# Patient Record
Sex: Female | Born: 1935 | Race: White | Hispanic: No | Smoking: Never smoker
Health system: Southern US, Community
[De-identification: ages and names within clinical notes are randomized; demographics above are authoritative.]

## PROBLEM LIST (undated history)

## (undated) DIAGNOSIS — F0390 Unspecified dementia without behavioral disturbance: Secondary | ICD-10-CM

## (undated) DIAGNOSIS — I340 Nonrheumatic mitral (valve) insufficiency: Secondary | ICD-10-CM

## (undated) DIAGNOSIS — D72829 Elevated white blood cell count, unspecified: Secondary | ICD-10-CM

## (undated) DIAGNOSIS — E785 Hyperlipidemia, unspecified: Secondary | ICD-10-CM

## (undated) DIAGNOSIS — F419 Anxiety disorder, unspecified: Secondary | ICD-10-CM

## (undated) DIAGNOSIS — I2510 Atherosclerotic heart disease of native coronary artery without angina pectoris: Secondary | ICD-10-CM

## (undated) DIAGNOSIS — R269 Unspecified abnormalities of gait and mobility: Secondary | ICD-10-CM

## (undated) DIAGNOSIS — I10 Essential (primary) hypertension: Secondary | ICD-10-CM

## (undated) DIAGNOSIS — G629 Polyneuropathy, unspecified: Secondary | ICD-10-CM

## (undated) DIAGNOSIS — S62101A Fracture of unspecified carpal bone, right wrist, initial encounter for closed fracture: Secondary | ICD-10-CM

## (undated) DIAGNOSIS — R200 Anesthesia of skin: Secondary | ICD-10-CM

## (undated) DIAGNOSIS — I341 Nonrheumatic mitral (valve) prolapse: Secondary | ICD-10-CM

## (undated) DIAGNOSIS — I219 Acute myocardial infarction, unspecified: Secondary | ICD-10-CM

## (undated) DIAGNOSIS — I4891 Unspecified atrial fibrillation: Secondary | ICD-10-CM

## (undated) DIAGNOSIS — I6529 Occlusion and stenosis of unspecified carotid artery: Secondary | ICD-10-CM

## (undated) DIAGNOSIS — C9110 Chronic lymphocytic leukemia of B-cell type not having achieved remission: Secondary | ICD-10-CM

## (undated) HISTORY — DX: Unspecified abnormalities of gait and mobility: R26.9

## (undated) HISTORY — PX: CORONARY ANGIOPLASTY WITH STENT PLACEMENT: SHX49

## (undated) HISTORY — PX: CARDIAC CATHETERIZATION: SHX172

## (undated) HISTORY — PX: CORONARY ARTERY BYPASS GRAFT: SHX141

## (undated) HISTORY — DX: Chronic lymphocytic leukemia of B-cell type not having achieved remission: C91.10

---

## 1983-06-19 HISTORY — PX: INNER EAR SURGERY: SHX679

## 1989-06-18 HISTORY — PX: WRIST RECONSTRUCTION: SHX2675

## 1999-01-25 ENCOUNTER — Inpatient Hospital Stay (HOSPITAL_COMMUNITY): Admission: EM | Admit: 1999-01-25 | Discharge: 1999-01-27 | Payer: Self-pay | Admitting: Emergency Medicine

## 1999-01-25 ENCOUNTER — Encounter: Payer: Self-pay | Admitting: Cardiology

## 2000-08-07 ENCOUNTER — Encounter: Payer: Self-pay | Admitting: General Surgery

## 2000-08-12 ENCOUNTER — Ambulatory Visit (HOSPITAL_COMMUNITY): Admission: RE | Admit: 2000-08-12 | Discharge: 2000-08-12 | Payer: Self-pay | Admitting: General Surgery

## 2000-08-12 HISTORY — PX: OTHER SURGICAL HISTORY: SHX169

## 2002-02-08 ENCOUNTER — Emergency Department (HOSPITAL_COMMUNITY): Admission: EM | Admit: 2002-02-08 | Discharge: 2002-02-08 | Payer: Self-pay | Admitting: Emergency Medicine

## 2002-02-08 ENCOUNTER — Encounter: Payer: Self-pay | Admitting: Emergency Medicine

## 2003-05-22 ENCOUNTER — Emergency Department (HOSPITAL_COMMUNITY): Admission: EM | Admit: 2003-05-22 | Discharge: 2003-05-23 | Payer: Self-pay

## 2003-10-18 ENCOUNTER — Encounter: Admission: RE | Admit: 2003-10-18 | Discharge: 2003-10-18 | Payer: Self-pay | Admitting: General Surgery

## 2003-10-29 ENCOUNTER — Ambulatory Visit (HOSPITAL_COMMUNITY): Admission: RE | Admit: 2003-10-29 | Discharge: 2003-10-29 | Payer: Self-pay | Admitting: Cardiothoracic Surgery

## 2003-10-29 HISTORY — PX: STERNAL WIRE REMOVAL: SHX2440

## 2003-11-25 ENCOUNTER — Encounter: Admission: RE | Admit: 2003-11-25 | Discharge: 2003-11-25 | Payer: Self-pay | Admitting: Cardiothoracic Surgery

## 2004-09-11 ENCOUNTER — Ambulatory Visit (HOSPITAL_COMMUNITY): Admission: RE | Admit: 2004-09-11 | Discharge: 2004-09-11 | Payer: Self-pay | Admitting: Internal Medicine

## 2005-01-30 ENCOUNTER — Encounter: Admission: RE | Admit: 2005-01-30 | Discharge: 2005-01-30 | Payer: Self-pay | Admitting: Cardiology

## 2005-06-14 ENCOUNTER — Encounter: Admission: RE | Admit: 2005-06-14 | Discharge: 2005-06-14 | Payer: Self-pay | Admitting: Endocrinology

## 2005-06-26 ENCOUNTER — Ambulatory Visit (HOSPITAL_COMMUNITY): Admission: RE | Admit: 2005-06-26 | Discharge: 2005-06-26 | Payer: Self-pay | Admitting: Endocrinology

## 2006-08-13 ENCOUNTER — Encounter: Admission: RE | Admit: 2006-08-13 | Discharge: 2006-09-16 | Payer: Self-pay | Admitting: Internal Medicine

## 2006-08-20 ENCOUNTER — Other Ambulatory Visit: Admission: RE | Admit: 2006-08-20 | Discharge: 2006-08-20 | Payer: Self-pay | Admitting: Internal Medicine

## 2006-09-17 ENCOUNTER — Encounter: Admission: RE | Admit: 2006-09-17 | Discharge: 2006-10-17 | Payer: Self-pay | Admitting: Internal Medicine

## 2007-05-28 ENCOUNTER — Ambulatory Visit: Payer: Self-pay | Admitting: Oncology

## 2007-07-09 LAB — CBC WITH DIFFERENTIAL/PLATELET
EOS%: 1 % (ref 0.0–7.0)
HGB: 13.9 g/dL (ref 11.6–15.9)
MCH: 29.5 pg (ref 26.0–34.0)
MCV: 83.4 fL (ref 81.0–101.0)
MONO%: 3 % (ref 0.0–13.0)
NEUT#: 5.1 10*3/uL (ref 1.5–6.5)
RBC: 4.74 10*6/uL (ref 3.70–5.32)
RDW: 13.6 % (ref 11.3–14.5)
lymph#: 5.8 10*3/uL — ABNORMAL HIGH (ref 0.9–3.3)

## 2007-07-09 LAB — COMPREHENSIVE METABOLIC PANEL
ALT: 12 U/L (ref 0–35)
AST: 15 U/L (ref 0–37)
Alkaline Phosphatase: 70 U/L (ref 39–117)
Creatinine, Ser: 1.16 mg/dL (ref 0.40–1.20)
Sodium: 138 mEq/L (ref 135–145)
Total Bilirubin: 0.3 mg/dL (ref 0.3–1.2)
Total Protein: 7 g/dL (ref 6.0–8.3)

## 2007-07-09 LAB — CHCC SMEAR

## 2007-07-10 ENCOUNTER — Other Ambulatory Visit: Admission: RE | Admit: 2007-07-10 | Discharge: 2007-07-10 | Payer: Self-pay | Admitting: Oncology

## 2007-07-14 LAB — FLOW CYTOMETRY

## 2007-07-24 ENCOUNTER — Encounter: Admission: RE | Admit: 2007-07-24 | Discharge: 2007-09-18 | Payer: Self-pay | Admitting: Internal Medicine

## 2007-10-07 ENCOUNTER — Ambulatory Visit: Payer: Self-pay | Admitting: Oncology

## 2007-10-23 LAB — CBC WITH DIFFERENTIAL/PLATELET
BASO%: 0.4 % (ref 0.0–2.0)
Basophils Absolute: 0.1 10*3/uL (ref 0.0–0.1)
EOS%: 1.4 % (ref 0.0–7.0)
HGB: 13.4 g/dL (ref 11.6–15.9)
MCH: 28.8 pg (ref 26.0–34.0)
MCHC: 35 g/dL (ref 32.0–36.0)
MCV: 82.4 fL (ref 81.0–101.0)
MONO%: 4 % (ref 0.0–13.0)
RDW: 13.5 % (ref 11.3–14.5)
lymph#: 8.4 10*3/uL — ABNORMAL HIGH (ref 0.9–3.3)

## 2008-04-21 ENCOUNTER — Ambulatory Visit: Payer: Self-pay | Admitting: Oncology

## 2008-06-17 ENCOUNTER — Emergency Department (HOSPITAL_COMMUNITY): Admission: EM | Admit: 2008-06-17 | Discharge: 2008-06-17 | Payer: Self-pay | Admitting: Emergency Medicine

## 2008-08-15 ENCOUNTER — Inpatient Hospital Stay (HOSPITAL_COMMUNITY): Admission: EM | Admit: 2008-08-15 | Discharge: 2008-08-18 | Payer: Self-pay | Admitting: Emergency Medicine

## 2008-08-17 ENCOUNTER — Encounter (INDEPENDENT_AMBULATORY_CARE_PROVIDER_SITE_OTHER): Payer: Self-pay | Admitting: Cardiology

## 2008-08-17 ENCOUNTER — Ambulatory Visit: Payer: Self-pay | Admitting: Vascular Surgery

## 2009-01-11 ENCOUNTER — Encounter: Admission: RE | Admit: 2009-01-11 | Discharge: 2009-04-11 | Payer: Self-pay | Admitting: Neurology

## 2009-02-22 ENCOUNTER — Emergency Department (HOSPITAL_COMMUNITY): Admission: EM | Admit: 2009-02-22 | Discharge: 2009-02-22 | Payer: Self-pay | Admitting: Emergency Medicine

## 2009-04-13 ENCOUNTER — Ambulatory Visit (HOSPITAL_COMMUNITY): Admission: RE | Admit: 2009-04-13 | Discharge: 2009-04-13 | Payer: Self-pay | Admitting: Internal Medicine

## 2009-04-25 ENCOUNTER — Encounter: Admission: RE | Admit: 2009-04-25 | Discharge: 2009-04-25 | Payer: Self-pay | Admitting: Neurology

## 2009-06-06 ENCOUNTER — Ambulatory Visit: Payer: Self-pay | Admitting: Diagnostic Radiology

## 2009-06-06 ENCOUNTER — Emergency Department (HOSPITAL_BASED_OUTPATIENT_CLINIC_OR_DEPARTMENT_OTHER): Admission: EM | Admit: 2009-06-06 | Discharge: 2009-06-06 | Payer: Self-pay | Admitting: Emergency Medicine

## 2009-06-16 ENCOUNTER — Ambulatory Visit: Payer: Self-pay | Admitting: Oncology

## 2009-07-22 ENCOUNTER — Ambulatory Visit: Payer: Self-pay | Admitting: Oncology

## 2009-08-23 ENCOUNTER — Ambulatory Visit: Payer: Self-pay | Admitting: Oncology

## 2009-08-23 LAB — CBC WITH DIFFERENTIAL/PLATELET
BASO%: 0.4 % (ref 0.0–2.0)
HGB: 11.8 g/dL (ref 11.6–15.9)
LYMPH%: 58.3 % — ABNORMAL HIGH (ref 14.0–49.7)
MCHC: 34.8 g/dL (ref 31.5–36.0)
MONO#: 0.4 10*3/uL (ref 0.1–0.9)
NEUT%: 36.7 % — ABNORMAL LOW (ref 38.4–76.8)
Platelets: 244 10*3/uL (ref 145–400)
RBC: 3.81 10*6/uL (ref 3.70–5.45)

## 2009-08-23 LAB — COMPREHENSIVE METABOLIC PANEL
ALT: 10 U/L (ref 0–35)
AST: 17 U/L (ref 0–37)
BUN: 28 mg/dL — ABNORMAL HIGH (ref 6–23)
CO2: 26 mEq/L (ref 19–32)
Creatinine, Ser: 1.18 mg/dL (ref 0.40–1.20)
Glucose, Bld: 240 mg/dL — ABNORMAL HIGH (ref 70–99)
Sodium: 138 mEq/L (ref 135–145)

## 2009-08-23 LAB — FERRITIN: Ferritin: 124 ng/mL (ref 10–291)

## 2010-02-16 ENCOUNTER — Ambulatory Visit: Payer: Self-pay | Admitting: Oncology

## 2010-02-24 LAB — CBC WITH DIFFERENTIAL/PLATELET
BASO%: 0.4 % (ref 0.0–2.0)
Basophils Absolute: 0.1 10*3/uL (ref 0.0–0.1)
EOS%: 1 % (ref 0.0–7.0)
Eosinophils Absolute: 0.1 10*3/uL (ref 0.0–0.5)
HCT: 33.3 % — ABNORMAL LOW (ref 34.8–46.6)
MCH: 29.5 pg (ref 25.1–34.0)
MONO#: 0.5 10*3/uL (ref 0.1–0.9)
NEUT#: 3.7 10*3/uL (ref 1.5–6.5)
lymph#: 10.3 10*3/uL — ABNORMAL HIGH (ref 0.9–3.3)

## 2010-02-24 LAB — COMPREHENSIVE METABOLIC PANEL
Albumin: 4.2 g/dL (ref 3.5–5.2)
Alkaline Phosphatase: 52 U/L (ref 39–117)
Calcium: 9.2 mg/dL (ref 8.4–10.5)
Chloride: 100 mEq/L (ref 96–112)
Creatinine, Ser: 1.58 mg/dL — ABNORMAL HIGH (ref 0.40–1.20)
Glucose, Bld: 159 mg/dL — ABNORMAL HIGH (ref 70–99)
Potassium: 4.4 mEq/L (ref 3.5–5.3)
Total Bilirubin: 0.4 mg/dL (ref 0.3–1.2)
Total Protein: 6.5 g/dL (ref 6.0–8.3)

## 2010-02-24 LAB — FERRITIN: Ferritin: 143 ng/mL (ref 10–291)

## 2010-02-24 LAB — IRON AND TIBC
TIBC: 348 ug/dL (ref 250–470)
UIBC: 293 ug/dL

## 2010-03-15 ENCOUNTER — Encounter
Admission: RE | Admit: 2010-03-15 | Discharge: 2010-05-30 | Payer: Self-pay | Source: Home / Self Care | Attending: Neurology | Admitting: Neurology

## 2010-08-24 ENCOUNTER — Encounter (HOSPITAL_BASED_OUTPATIENT_CLINIC_OR_DEPARTMENT_OTHER): Payer: Medicare Other | Admitting: Oncology

## 2010-08-24 DIAGNOSIS — D509 Iron deficiency anemia, unspecified: Secondary | ICD-10-CM

## 2010-08-24 DIAGNOSIS — D72820 Lymphocytosis (symptomatic): Secondary | ICD-10-CM

## 2010-09-14 ENCOUNTER — Inpatient Hospital Stay (HOSPITAL_COMMUNITY)
Admission: EM | Admit: 2010-09-14 | Discharge: 2010-09-18 | DRG: 313 | Disposition: A | Discharge status: Home Health | Payer: Medicare Other | Attending: Internal Medicine | Admitting: Internal Medicine

## 2010-09-14 ENCOUNTER — Emergency Department (HOSPITAL_COMMUNITY): Payer: Medicare Other

## 2010-09-14 DIAGNOSIS — F0390 Unspecified dementia without behavioral disturbance: Secondary | ICD-10-CM | POA: Diagnosis present

## 2010-09-14 DIAGNOSIS — N390 Urinary tract infection, site not specified: Secondary | ICD-10-CM | POA: Diagnosis present

## 2010-09-14 DIAGNOSIS — R0602 Shortness of breath: Secondary | ICD-10-CM

## 2010-09-14 DIAGNOSIS — Z7902 Long term (current) use of antithrombotics/antiplatelets: Secondary | ICD-10-CM

## 2010-09-14 DIAGNOSIS — C9110 Chronic lymphocytic leukemia of B-cell type not having achieved remission: Secondary | ICD-10-CM | POA: Diagnosis present

## 2010-09-14 DIAGNOSIS — E876 Hypokalemia: Secondary | ICD-10-CM | POA: Diagnosis present

## 2010-09-14 DIAGNOSIS — Z79899 Other long term (current) drug therapy: Secondary | ICD-10-CM

## 2010-09-14 DIAGNOSIS — I658 Occlusion and stenosis of other precerebral arteries: Secondary | ICD-10-CM | POA: Diagnosis present

## 2010-09-14 DIAGNOSIS — I2510 Atherosclerotic heart disease of native coronary artery without angina pectoris: Secondary | ICD-10-CM | POA: Diagnosis present

## 2010-09-14 DIAGNOSIS — G20A1 Parkinson's disease without dyskinesia, without mention of fluctuations: Secondary | ICD-10-CM | POA: Diagnosis present

## 2010-09-14 DIAGNOSIS — D649 Anemia, unspecified: Secondary | ICD-10-CM | POA: Diagnosis present

## 2010-09-14 DIAGNOSIS — D72829 Elevated white blood cell count, unspecified: Secondary | ICD-10-CM | POA: Diagnosis present

## 2010-09-14 DIAGNOSIS — I6529 Occlusion and stenosis of unspecified carotid artery: Secondary | ICD-10-CM | POA: Diagnosis present

## 2010-09-14 DIAGNOSIS — E860 Dehydration: Secondary | ICD-10-CM | POA: Diagnosis not present

## 2010-09-14 DIAGNOSIS — E785 Hyperlipidemia, unspecified: Secondary | ICD-10-CM | POA: Diagnosis present

## 2010-09-14 DIAGNOSIS — G20 Parkinson's disease: Secondary | ICD-10-CM | POA: Diagnosis present

## 2010-09-14 DIAGNOSIS — I119 Hypertensive heart disease without heart failure: Secondary | ICD-10-CM | POA: Diagnosis present

## 2010-09-14 DIAGNOSIS — R0789 Other chest pain: Principal | ICD-10-CM | POA: Diagnosis present

## 2010-09-14 DIAGNOSIS — Z951 Presence of aortocoronary bypass graft: Secondary | ICD-10-CM

## 2010-09-14 DIAGNOSIS — Z9861 Coronary angioplasty status: Secondary | ICD-10-CM

## 2010-09-14 DIAGNOSIS — F411 Generalized anxiety disorder: Secondary | ICD-10-CM | POA: Diagnosis present

## 2010-09-14 DIAGNOSIS — I4891 Unspecified atrial fibrillation: Secondary | ICD-10-CM | POA: Diagnosis present

## 2010-09-14 DIAGNOSIS — N179 Acute kidney failure, unspecified: Secondary | ICD-10-CM | POA: Diagnosis not present

## 2010-09-14 DIAGNOSIS — IMO0001 Reserved for inherently not codable concepts without codable children: Secondary | ICD-10-CM | POA: Diagnosis present

## 2010-09-14 DIAGNOSIS — G609 Hereditary and idiopathic neuropathy, unspecified: Secondary | ICD-10-CM | POA: Diagnosis present

## 2010-09-14 DIAGNOSIS — I252 Old myocardial infarction: Secondary | ICD-10-CM

## 2010-09-14 DIAGNOSIS — I059 Rheumatic mitral valve disease, unspecified: Secondary | ICD-10-CM | POA: Diagnosis present

## 2010-09-14 HISTORY — DX: Essential (primary) hypertension: I10

## 2010-09-14 LAB — BASIC METABOLIC PANEL
CO2: 37 mEq/L — ABNORMAL HIGH (ref 19–32)
Chloride: 97 mEq/L (ref 96–112)
Creatinine, Ser: 1.06 mg/dL (ref 0.4–1.2)
GFR calc Af Amer: >60 mL/min (ref >60)
Glucose, Bld: 216 mg/dL — ABNORMAL HIGH (ref 70–99)

## 2010-09-14 LAB — URINALYSIS, ROUTINE W REFLEX MICROSCOPIC
Glucose, UA: 100 mg/dL — AB
Hgb urine dipstick: NEGATIVE
Ketones, ur: NEGATIVE mg/dL
Protein, ur: 30 mg/dL — AB

## 2010-09-14 LAB — CBC
Hemoglobin: 11 g/dL — ABNORMAL LOW (ref 12.0–15.0)
MCH: 28.6 pg (ref 26.0–34.0)
MCV: 86 fL (ref 78.0–100.0)
Platelets: 250 10*3/uL (ref 150–400)
RBC: 3.85 MIL/uL — ABNORMAL LOW (ref 3.87–5.11)

## 2010-09-14 LAB — GLUCOSE, CAPILLARY
Glucose-Capillary: 107 mg/dL — ABNORMAL HIGH (ref 70–99)
Glucose-Capillary: 321 mg/dL — ABNORMAL HIGH (ref 70–99)

## 2010-09-14 LAB — DIFFERENTIAL
Basophils Relative: 0 % (ref 0–1)
Eosinophils Absolute: 0.2 10*3/uL (ref 0.0–0.7)
Lymphocytes Relative: 75 % — ABNORMAL HIGH (ref 12–46)
Neutro Abs: 3.3 10*3/uL (ref 1.7–7.7)

## 2010-09-14 LAB — CARDIAC PANEL(CRET KIN+CKTOT+MB+TROPI)
CK, MB: 1.4 ng/mL (ref 0.3–4.0)
CK, MB: 1.4 ng/mL (ref 0.3–4.0)
CK, MB: 1.4 ng/mL (ref 0.3–4.0)
Total CK: 40 U/L (ref 7–177)
Total CK: 33 U/L (ref 7–177)
Total CK: 37 U/L (ref 7–177)
Troponin I: 0.02 ng/mL (ref 0.00–0.06)
Troponin I: 0.01 ng/mL (ref 0.00–0.06)

## 2010-09-14 LAB — POCT CARDIAC MARKERS

## 2010-09-14 LAB — URINE MICROSCOPIC-ADD ON

## 2010-09-15 ENCOUNTER — Inpatient Hospital Stay (HOSPITAL_COMMUNITY): Payer: Medicare Other

## 2010-09-15 ENCOUNTER — Other Ambulatory Visit (HOSPITAL_COMMUNITY): Payer: Medicare Other

## 2010-09-15 ENCOUNTER — Encounter (HOSPITAL_COMMUNITY): Payer: Self-pay | Admitting: Radiology

## 2010-09-15 DIAGNOSIS — I6350 Cerebral infarction due to unspecified occlusion or stenosis of unspecified cerebral artery: Secondary | ICD-10-CM

## 2010-09-15 LAB — URINE CULTURE: Culture  Setup Time: 201203291255

## 2010-09-15 LAB — BASIC METABOLIC PANEL
Chloride: 102 mEq/L (ref 96–112)
Creatinine, Ser: 1.4 mg/dL — ABNORMAL HIGH (ref 0.4–1.2)
GFR calc Af Amer: 44 mL/min — ABNORMAL LOW (ref >60)
GFR calc non Af Amer: 37 mL/min — ABNORMAL LOW (ref >60)

## 2010-09-15 LAB — CBC
Hemoglobin: 11.7 g/dL — ABNORMAL LOW (ref 12.0–15.0)
MCHC: 33.4 g/dL (ref 30.0–36.0)
RBC: 4.04 MIL/uL (ref 3.87–5.11)

## 2010-09-15 LAB — LIPID PANEL
Cholesterol: 119 mg/dL (ref 0–200)
LDL Cholesterol: 72 mg/dL (ref 0–99)
Total CHOL/HDL Ratio: 4.6 RATIO

## 2010-09-15 LAB — GLUCOSE, CAPILLARY
Glucose-Capillary: 107 mg/dL — ABNORMAL HIGH (ref 70–99)
Glucose-Capillary: 177 mg/dL — ABNORMAL HIGH (ref 70–99)
Glucose-Capillary: 109 mg/dL — ABNORMAL HIGH (ref 70–99)
Glucose-Capillary: 134 mg/dL — ABNORMAL HIGH (ref 70–99)

## 2010-09-15 MED ORDER — IOHEXOL 300 MG/ML  SOLN
80.0000 mL | Freq: Once | INTRAMUSCULAR | Status: AC | PRN
  Administered 2010-09-15: 80 mL via INTRAVENOUS

## 2010-09-16 ENCOUNTER — Inpatient Hospital Stay (HOSPITAL_COMMUNITY): Payer: Medicare Other

## 2010-09-16 LAB — GLUCOSE, CAPILLARY
Glucose-Capillary: 171 mg/dL — ABNORMAL HIGH (ref 70–99)
Glucose-Capillary: 97 mg/dL (ref 70–99)
Glucose-Capillary: 117 mg/dL — ABNORMAL HIGH (ref 70–99)

## 2010-09-16 LAB — COMPREHENSIVE METABOLIC PANEL
ALT: <8 U/L (ref 0–35)
AST: 19 U/L (ref 0–37)
Albumin: 3 g/dL — ABNORMAL LOW (ref 3.5–5.2)
CO2: 30 mEq/L (ref 19–32)
Chloride: 102 mEq/L (ref 96–112)
Creatinine, Ser: 1.29 mg/dL — ABNORMAL HIGH (ref 0.4–1.2)
GFR calc Af Amer: 49 mL/min — ABNORMAL LOW (ref >60)
GFR calc non Af Amer: 40 mL/min — ABNORMAL LOW (ref >60)
Sodium: 140 mEq/L (ref 135–145)
Total Bilirubin: 0.3 mg/dL (ref 0.3–1.2)

## 2010-09-16 LAB — CBC
Hemoglobin: 10.8 g/dL — ABNORMAL LOW (ref 12.0–15.0)
MCH: 28.5 pg (ref 26.0–34.0)
Platelets: 242 10*3/uL (ref 150–400)
RBC: 3.79 MIL/uL — ABNORMAL LOW (ref 3.87–5.11)
WBC: 17.3 10*3/uL — ABNORMAL HIGH (ref 4.0–10.5)

## 2010-09-17 LAB — BASIC METABOLIC PANEL
Calcium: 9.4 mg/dL (ref 8.4–10.5)
Creatinine, Ser: 1.04 mg/dL (ref 0.4–1.2)
GFR calc Af Amer: >60 mL/min (ref >60)
Sodium: 141 mEq/L (ref 135–145)

## 2010-09-17 LAB — CBC
MCH: 28.7 pg (ref 26.0–34.0)
MCHC: 33.1 g/dL (ref 30.0–36.0)
Platelets: 241 10*3/uL (ref 150–400)
RBC: 3.76 MIL/uL — ABNORMAL LOW (ref 3.87–5.11)

## 2010-09-17 LAB — GLUCOSE, CAPILLARY
Glucose-Capillary: 66 mg/dL — ABNORMAL LOW (ref 70–99)
Glucose-Capillary: 184 mg/dL — ABNORMAL HIGH (ref 70–99)
Glucose-Capillary: 124 mg/dL — ABNORMAL HIGH (ref 70–99)
Glucose-Capillary: 144 mg/dL — ABNORMAL HIGH (ref 70–99)

## 2010-09-18 DIAGNOSIS — F3289 Other specified depressive episodes: Secondary | ICD-10-CM

## 2010-09-18 DIAGNOSIS — F329 Major depressive disorder, single episode, unspecified: Secondary | ICD-10-CM

## 2010-09-18 LAB — DIFFERENTIAL
Basophils Absolute: 0.1 10*3/uL (ref 0.0–0.1)
Eosinophils Relative: 1 % (ref 0–5)
Lymphocytes Relative: 61 % — ABNORMAL HIGH (ref 12–46)
Lymphs Abs: 7.3 10*3/uL — ABNORMAL HIGH (ref 0.7–4.0)
Monocytes Relative: 4 % (ref 3–12)

## 2010-09-18 LAB — CBC
HCT: 34.3 % — ABNORMAL LOW (ref 36.0–46.0)
Hemoglobin: 11.9 g/dL — ABNORMAL LOW (ref 12.0–15.0)
Platelets: 225 10*3/uL (ref 150–400)
Platelets: 235 10*3/uL (ref 150–400)
RBC: 3.48 MIL/uL — ABNORMAL LOW (ref 3.87–5.11)
RDW: 14.3 % (ref 11.5–15.5)
RDW: 13.2 % (ref 11.5–15.5)
WBC: 15.3 10*3/uL — ABNORMAL HIGH (ref 4.0–10.5)
WBC: 12 10*3/uL — ABNORMAL HIGH (ref 4.0–10.5)

## 2010-09-18 LAB — FOLATE: Folate: 5 ng/mL

## 2010-09-18 LAB — GLUCOSE, CAPILLARY
Glucose-Capillary: 96 mg/dL (ref 70–99)
Glucose-Capillary: 99 mg/dL (ref 70–99)

## 2010-09-18 LAB — BASIC METABOLIC PANEL
BUN: 25 mg/dL — ABNORMAL HIGH (ref 6–23)
Calcium: 9.3 mg/dL (ref 8.4–10.5)
GFR calc non Af Amer: 49 mL/min — ABNORMAL LOW (ref >60)
Potassium: 4.2 mEq/L (ref 3.5–5.1)
Sodium: 145 mEq/L (ref 135–145)

## 2010-09-18 LAB — VITAMIN B12: Vitamin B-12: 395 pg/mL (ref 211–911)

## 2010-09-18 LAB — IRON AND TIBC: Iron: 68 ug/dL (ref 42–135)

## 2010-09-18 LAB — PATHOLOGIST SMEAR REVIEW

## 2010-09-19 NOTE — Consult Note (Signed)
NAME:  April Baker, April Baker NO.:  [id number]  MEDICAL RECORD NO.:  [id number]           PATIENT TYPE:  I  LOCATION:  3706                         FACILITY:  [facility]  PHYSICIAN:  Thana Farr, MD    DATE OF BIRTH:  03-Apr-1933  DATE OF CONSULTATION:  09/17/2010 DATE OF DISCHARGE:                                CONSULTATION   HISTORY:  April Baker is a 77 year old female that was admitted with some confusion and complaints of dizziness.  It seems that her complaints have changed and varied throughout the hospitalization.  It seems that for the most part her complaints of dizziness have resolved, but now she has difficulty with gait.  It seems that she has problems starting her gait and sometimes will be stuck in the middle of walking.  On speaking with Ms. Neva Seat today, she reports this has been a longstanding problem.  She does have a diagnosis of Parkinson disease.  She also has a diagnosis of peripheral neuropathy as well and does admit to multiple falls in the past.  She reports that at baseline she needs some assistance with ambulation.  She feels she is better than when admitted.  PAST MEDICAL HISTORY: 1. Parkinson disease. 2. Peripheral neuropathy. 3. Atrial fibrillation. 4. Mitral valve regurgitation. 5. Coronary artery disease, status post MI, CABG, and stent placement.  MEDICATIONS:  Sinemet, ceftriaxone, aspirin, vitamin D, Plavix, Lanoxin, Lovenox, fenofibrate, iron, NovoLog, Tradjenta, Mag-Ox.  SOCIAL HISTORY:  The patient has no history of alcohol, tobacco, or illicit drug abuse.  She lives at home and has aide come in at least 3 times a week.  PHYSICAL EXAMINATION:  VITAL SIGNS:  Blood pressure 116/56, heart rate 83, respiratory rate 19, T-max 99.4. MENTAL STATUS TESTING:  The patient is alert and oriented x3.  Speech is without aphasia.  The patient can follow commands without difficulty. She does speak to me with eyes closed reportedly because  of light sensitivity.  On cranial nerve testing, II, visual fields grossly intact.  III, IV, VI, extraocular movements intact.  V and VII, smile symmetric.  VIII, grossly intact.  IX and VII, positive gag.  XI, bilateral shoulder shrug.  XII, midline tongue extension.  On motor exam, the patient has some generalized weakness at approximately 5-/5. She is symmetric.  She does have some cogwheeling bilaterally that is mild.  On sensory testing, the patient reports intact pinprick and light touch bilaterally.  She does report decreased vibratory sense to the ankle on left lower extremity and to the knee on the right lower extremity.  Deep tendon reflexes are 3+ throughout with absent ankle jerks.  Plantars are downgoing on the left and mute on the right.  On cerebellar testing, finger-to-nose and heel-to-shin is intact.  LABORATORY DATA:  White blood cell count 17.5, platelet count 241, hemoglobin and hematocrit 10.8 and 32.6 respectively.  Sodium 141, potassium 4.2, chloride 104, bicarb 26, BUN and creatinine 23 and 1.04 respectively, glucose 81, calcium 9.4.  TSH 1.034.  CT has been performed and shows no acute changes.  ASSESSMENT:  April Baker is a 77 year old female with a  history of Parkinson disease and peripheral neuropathy.  This is clearly quite enough reason for her to have difficulty with gait and frequent falls.  It seems that with her admission though she had a urinary tract infection and has had an elevated white blood cell count.  This may very well not only have affected her mental status, but also her functional status and ambulation as well.  She seems to be nearing baseline with her mental status improved and with her having some ability to ambulate today.  The patient was gotten out of bed and was able to do some ambulation around the room, although she does have some periods of freezing.  It is clear that her Parkinson's is a management issue.  She is taking Sinemet  every 3 hours.  With her presenting mental status, it is also very likely that she was unable to be compliant with this regimen.  PLAN: 1. Would check orthostatics to make sure this is not the cause of the     patient's dizziness. 2. After discharge, we will have the patient to follow up with Dr.     Anne Hahn. 3. Agree with treatment of infection.          ______________________________ Thana Farr, MD     LR/MEDQ  D:  09/17/2010  T:  09/18/2010  Job:  [id number]  Electronically Signed by Thana Farr MD on 09/19/2010 06:31:15 PM

## 2010-09-20 LAB — CULTURE, BLOOD (ROUTINE X 2)
Culture  Setup Time: 201203291326
Culture: NO GROWTH

## 2010-09-21 LAB — GLUCOSE, CAPILLARY: Glucose-Capillary: 22 mg/dL — CL (ref 70–99)

## 2010-09-25 NOTE — H&P (Signed)
NAME:  April Baker, BALCOM NO.:  [id number]  MEDICAL RECORD NO.:  [id number]          PATIENT TYPE:  INP  LOCATION:                               FACILITY:  [facility]  PHYSICIAN:  Homero Fellers, MD   DATE OF BIRTH:  1933-05-30  DATE OF ADMISSION: DATE OF DISCHARGE:                             HISTORY & PHYSICAL   PRIMARY CARE PHYSICIAN:  Jeoffrey Massed, MD  CHIEF COMPLAINT:  Chest pain and shortness of breath.  HISTORY OF PRESENT ILLNESS:  This is a 77 year old woman with known history of coronary artery disease status post CABG and myocardial infarction who was brought by EMS for chest pain and shortness of breath which completely resolved when she got to the emergency room.  The patient said the pain has been going on for the past 2-3 days ago but got progressively worse and the pain is left sided, nonradiating, not accompanied with nausea, vomiting, diaphoresis, fever, palpitation, or dizziness.  The patient also denies any cough or sputum production.  She is a somewhat poor historian and she appears somewhat sleepy during my evaluation.  Initial set of cardiac enzymes were negative, and EKG is nondiagnostic.  In the emergency room, her blood pressure was also borderline low with readings of 92/48 compared to 142/74 when she first presented.  PAST MEDICAL HISTORY: 1. Diabetes mellitus. 2. Atrial fibrillation. 3. Mitral valve regurgitation. 4. Myocardial infarction. 5. Parkinson disease. 6. Coronary artery disease.  PAST SURGICAL HISTORY: 1. CABG. 2. Stent placement. 3. Cesarean section. 4. Wrist reconstruction.  MEDICATIONS:  Alprazolam, carbidopa/levodopa, buspirone, digoxin, fenofibrate, fish oil, flaxseed, Lasix, Januvia, Lutein, magnesium, nitroglycerin, Plavix, Tramadol, and vitamin D3.  ALLERGIES:  LATEX.  SOCIAL HISTORY:  No smoking, alcohol, or drug  FAMILY HISTORY:  Unable to provide.  A 10-point review of systems negative except as  above.  PHYSICAL EXAMINATION:  VITAL SIGNS:  Blood pressure is about 92/48, pulse 67, respirations 16, temperature 97.2 degrees. GENERAL:  The patient is lying in bed, sleepy, not in any distress. HEENT:  Pallor.  Extraocular muscles are intact. NECK:  Supple.  No JVD, adenopathy, or thyromegaly. LUNGS:  Clear to auscultation bilaterally. HEART:  S1 and S2.  Regular rate and rhythm.  No murmurs, rubs, or gallops. ABDOMEN:  Full, soft, nontender, bowel sounds present.  No masses. EXTREMITIES:  No edema, clubbing, or cyanosis. NEUROLOGIC:  No deficits.  LABORATORY DATA:  White count 16.3 with lymphocytosis, hemoglobin 11, platelet count 250.  Digoxin level is 0.2.  BNP 279.  Potassium 3, glucose 216, calcium 9.6, sodium 142.  Chest x-ray showed no infiltrates.  She has cardiomegaly and COPD picture.  First set of cardiac enzymes negative.  EKG, normal sinus rhythm with LVH and nonspecific ST changes.  UA is pending.  ASSESSMENT: 1. This is a 77 year old woman with history of coronary artery disease     admitted with chest pain and shortness of breath to rule out acute     coronary syndrome. 2. Leukocytosis with no clear source at this time even though there is     no left shift. 3. Uncontrolled diabetes  mellitus. 4. Hypokalemia. 5. Borderline hypotension. 6. Atrial fibrillation which is currently well controlled.  PLAN:  Admit to telemetry and do serial cardiac enzyme and EKG. Consider stress testing if enzymes are negative.  Send urinalysis, urine culture, and blood culture.  Hold on antibiotics at this time. Supplement potassium and check magnesium level.  Optimize her diabetes control.  Her condition overall is stable.     Homero Fellers, MD     FA/MEDQ  D:  09/14/2010  T:  09/14/2010  Job:  [id number]  Electronically Signed by Homero Fellers  on 09/25/2010 09:15:14 PM

## 2010-09-28 LAB — CARDIAC PANEL(CRET KIN+CKTOT+MB+TROPI)
Relative Index: INVALID (ref 0.0–2.5)
Troponin I: <0.01 ng/mL (ref 0.00–0.06)

## 2010-09-28 LAB — GLUCOSE, CAPILLARY
Glucose-Capillary: 159 mg/dL — ABNORMAL HIGH (ref 70–99)
Glucose-Capillary: 337 mg/dL — ABNORMAL HIGH (ref 70–99)
Glucose-Capillary: 61 mg/dL — ABNORMAL LOW (ref 70–99)
Glucose-Capillary: 72 mg/dL (ref 70–99)
Glucose-Capillary: 86 mg/dL (ref 70–99)
Glucose-Capillary: 88 mg/dL (ref 70–99)

## 2010-09-28 LAB — BASIC METABOLIC PANEL WITH GFR
BUN: 21 mg/dL (ref 6–23)
CO2: 27 meq/L (ref 19–32)
Calcium: 8.8 mg/dL (ref 8.4–10.5)
Chloride: 108 meq/L (ref 96–112)
Creatinine, Ser: 1.33 mg/dL — ABNORMAL HIGH (ref 0.4–1.2)
GFR calc non Af Amer: 39 mL/min — ABNORMAL LOW
Glucose, Bld: 76 mg/dL (ref 70–99)
Potassium: 4.3 meq/L (ref 3.5–5.1)
Sodium: 139 meq/L (ref 135–145)

## 2010-09-28 LAB — HEMOGLOBIN AND HEMATOCRIT, BLOOD: HCT: 34.5 % — ABNORMAL LOW (ref 36.0–46.0)

## 2010-09-28 LAB — BASIC METABOLIC PANEL
BUN: 17 mg/dL (ref 6–23)
CO2: 29 mEq/L (ref 19–32)
Calcium: 9.1 mg/dL (ref 8.4–10.5)
Chloride: 105 mEq/L (ref 96–112)
Creatinine, Ser: 1.15 mg/dL (ref 0.4–1.2)
GFR calc Af Amer: 56 mL/min — ABNORMAL LOW (ref >60)
Glucose, Bld: 84 mg/dL (ref 70–99)

## 2010-09-28 LAB — TROPONIN I: Troponin I: 0.05 ng/mL (ref 0.00–0.06)

## 2010-09-28 LAB — CBC
MCHC: 34.7 g/dL (ref 30.0–36.0)
MCV: 88.6 fL (ref 78.0–100.0)
Platelets: 233 10*3/uL (ref 150–400)
RDW: 14.1 % (ref 11.5–15.5)

## 2010-09-28 LAB — HEPARIN LEVEL (UNFRACTIONATED): Heparin Unfractionated: 0.39 IU/mL (ref 0.30–0.70)

## 2010-10-03 LAB — PATHOLOGIST SMEAR REVIEW

## 2010-10-03 LAB — URINALYSIS, ROUTINE W REFLEX MICROSCOPIC
Nitrite: NEGATIVE
Protein, ur: NEGATIVE mg/dL
Specific Gravity, Urine: 1.011 (ref 1.005–1.030)
Urobilinogen, UA: 0.2 mg/dL (ref 0.0–1.0)

## 2010-10-03 LAB — GLUCOSE, CAPILLARY: Glucose-Capillary: 140 mg/dL — ABNORMAL HIGH (ref 70–99)

## 2010-10-03 LAB — TROPONIN I: Troponin I: <0.01 ng/mL (ref 0.00–0.06)

## 2010-10-03 LAB — PROTIME-INR: Prothrombin Time: 13.4 seconds (ref 11.6–15.2)

## 2010-10-03 LAB — DIFFERENTIAL
Basophils Relative: 0 % (ref 0–1)
Eosinophils Relative: 1 % (ref 0–5)
Monocytes Relative: 3 % (ref 3–12)
Neutrophils Relative %: 39 % — ABNORMAL LOW (ref 43–77)

## 2010-10-03 LAB — CARDIAC PANEL(CRET KIN+CKTOT+MB+TROPI)
CK, MB: 1.8 ng/mL (ref 0.3–4.0)
Relative Index: INVALID (ref 0.0–2.5)
Total CK: 31 U/L (ref 7–177)

## 2010-10-03 LAB — BASIC METABOLIC PANEL
BUN: 24 mg/dL — ABNORMAL HIGH (ref 6–23)
CO2: 28 mEq/L (ref 19–32)
Chloride: 103 mEq/L (ref 96–112)
Creatinine, Ser: 1.43 mg/dL — ABNORMAL HIGH (ref 0.4–1.2)
Potassium: 4.4 mEq/L (ref 3.5–5.1)

## 2010-10-03 LAB — CBC
HCT: 34.2 % — ABNORMAL LOW (ref 36.0–46.0)
MCHC: 35.1 g/dL (ref 30.0–36.0)
MCV: 87.1 fL (ref 78.0–100.0)
Platelets: 235 10*3/uL (ref 150–400)
RBC: 3.93 MIL/uL (ref 3.87–5.11)
WBC: 12.3 10*3/uL — ABNORMAL HIGH (ref 4.0–10.5)

## 2010-10-03 LAB — DIGOXIN LEVEL: Digoxin Level: 0.4 ng/mL — ABNORMAL LOW (ref 0.8–2.0)

## 2010-10-04 NOTE — Consult Note (Signed)
NAME:  April Baker, RADZIEWICZ NO.:  [id number]  MEDICAL RECORD NO.:  [id number]           PATIENT TYPE:  I  LOCATION:  3706                         FACILITY:  [facility]  PHYSICIAN:  Georga Hacking, M.D.DATE OF BIRTH:  02-18-1933  DATE OF CONSULTATION:  09/14/2010                                 CONSULTATION   I was asked to see this 77 year old female for cardiac evaluation.  The patient is a very poor historian.  When I asked her why she called EMS, she stated that it was because she felt that something bad was going to happen to her and then she might be getting ready to fall and complained of being dizzy.  At some point, she was reported as having chest pain, but could not give me a clear history of chest pain.  EMS records states that she was complaining of dyspnea.  She evidently was admitted to the hospital earlier.  Her caregiver could not get an answer for her and called her daughter and when EMS came they had to breakdown the door of the house only to find that she was in the hospital.  Since admission to the hospital, she has had negative cardiac enzymes and an unremarkable EKG that shows LVH with some T-wave changes noted.  At the present time, she is rambling, circuitous historian and is not aware of the date.  She is confused and perseverating.  She is not currently having chest pain at the present time and complains mostly of some back pain.  Her cardiac history is remarkable for a known history of coronary artery bypass grafting previously.  She lost her graft to the LAD and later had a stent to the LAD.  At hospitalization 2 years ago when she was admitted with back and chest pain, she had both neurologic and psychiatric evaluation because of similar issues with her mentation and Parkinson's.  At that time, the mammary graft to the marginal branch was patent, the vein graft to the right coronary artery was patent, and the stent site to the LAD was  patent.  She has been living at home with a care giver and there has evidently been a strain social relation between herself and her only daughter since February of this year.  PAST MEDICAL HISTORY:  Medical:  Diabetes mellitus, hyperlipidemia, hypertension, elevated homocysteine, Parkinson disease, anxiety, and significant peripheral vascular disease.  Surgery:  Bypass grafting in 1994, cesarean section, wrist surgery, and removal of painful sternal wires.  ALLERGIES:  LIPITOR causes the itching.  Intolerances to ZETIA. Intolerance to PRAVASTATIN and SIMVASTATIN.  Allergy to LATEX.  SOCIAL HISTORY:  She is divorced and widowed and lives alone.  She has 1 daughter.  She is not able to get any exercise.  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  She has never smoked and does not use alcohol in excess.  She has a caregiver that comes in 3 days a week.  FAMILY HISTORY:  Positive for cardiac disease.  MEDICATIONS:  See the chart.  REVIEW OF SYSTEMS:  She has generalized malaise and fatigue.  She has had significant  weight loss.  She has had difficulty with thinking difficulty with insomnia, dizziness, memory loss, difficulty walking, and frequent falls at home.  She is diabetic with significant neuropathy and has significant low back and generalized arthritis.  She complains of constipation, hemorrhoids, and dyspepsia.  She does complain of some mild shortness of breath.  She has no PND or orthopnea and is not currently having edema.  Other than as noted above, the remainder of the review of systems is unremarkable.  PHYSICAL EXAMINATION:  GENERAL:  She is an elderly thin woman who is lying in bed and is an extremely poor historian.  The history has some perseveration to it and is circuitous and she jumps from one topic to the other. GENERAL:  Her blood pressure is 136/63, pulse is currently 70 and regular. SKIN:  Warm and dry.  No obvious signs of trauma. ENT:  EOMI.  PERRLA.  C and  S clear.  Funduscopic not examined.  Pharynx negative. NECK:  Supple without masses.  Soft carotid bruits noted. LUNGS:  Clear bilaterally. CARDIOVASCULAR:  A 2/6 systolic murmur at the left sternal border. ABDOMEN:  Soft and nontender with a mid abdominal bruit.  Bilateral femoral bruits are noted.  Distal pulses are reduced.  LABORATORY DATA:  White count of 16,300 with 75% lymphocytes and evidently a previous diagnosis of CLL.  Potassium is 3 and glucose was 218.  Renal function is normal.  Two sets of cardiac enzymes were negative thus far.  EKG shows voltage for LVH with T-wave changes anteriorly which are unchanged.  Chest x-ray shows cardiomegaly, COPD, and vascular calcification.  IMPRESSION: 1. Dementia, possible confusion. 2. Very unclear history and I am not convinced the chest pain was a     major complaint at the time of admission. 3. Coronary artery disease with previous mammary graft to the marginal     and the vein graft to the right artery and stenting of the left     anterior descending previously.  These were all patent 2 years ago     at catheterization. 4. Hypertensive heart disease. 5. Hypokalemia. 6. Significant Parkinson's. 7. Hyperlipidemia with statin intolerance. 8. Diabetes mellitus. 9. Anxiety. 10.Significant poor social situation.  RECOMMENDATIONS:  At the present time, I would check serial enzymes, EKGs and continue her on her current medications.  She does have a systolic murmur and would evaluate this with an echo.  Beyond this, her functional status at home is poor and I would wonder about her ability to even remain confidently at home by herself at this time from what I have heard thus far.  The patient needs to have social work consult, neurologic involvement and I would not advocate further cardiac workup other than echo unless she declares herself more unstable angina or positive enzymes.     Georga Hacking, M.D.     WST/MEDQ   D:  09/14/2010  T:  09/15/2010  Job:  [id number]  cc:   Lucky Cowboy, M.D. [employer] by W. Donnie Aho M.D. on 10/04/2010 02:05:43 PM

## 2010-10-10 NOTE — Discharge Summary (Signed)
NAME:  April Baker, WAID NO.:  [id number]  MEDICAL RECORD NO.:  [id number]           PATIENT TYPE:  I  LOCATION:  3706                         FACILITY:  [facility]  PHYSICIAN:  Shamarra Warda I Caylon Saine, MD      DATE OF BIRTH:  1933/01/05  DATE OF ADMISSION:  09/14/2010 DATE OF DISCHARGE:  09/18/2010                              DISCHARGE SUMMARY   PRIMARY CARE PHYSICIAN:  Lucky Cowboy, MD  CARDIOLOGIST:  Georga Hacking, MD  ONCOLOGIST:  Blenda Nicely. Clelia Croft, MD  DISCHARGE DIAGNOSES: 1. Chest pain, atypical myocardial infarction was ruled out. 2. Dizziness, resolved, felt to be possibly secondary to dehydration     and urinary tract infection. 3. Urinary tract infection, treated. 4. Bilateral carotid artery stenosis, right 40% to 59% internal     carotid artery stenosis with left 60% to 80% low-to-mid grade     internal carotid artery stenosis. 5. Anemia. 6. Leukocytosis secondary to chronic lymphocytic leukemia, follow up by Dr. Clelia Croft. 7. Chronic lower extremity numbness secondary to diabetes mellitus and     peripheral neuropathy. 8. Acute renal failure, resolved. 9. Mild cognitive impairment. 10.Diabetes mellitus. 11.History of paroxysmal atrial fibrillation. 12.Myocardial infarction, status post coronary artery bypass grafting.     The patient has recent cardiac cath which did show severe native     coronary artery disease with occlusion of right coronary artery and     circumflex with filling of distal vessel by graft.  Long term     patency was identified to the left anterior descending coronary     artery with moderate residual atherosclerotic stenosis and the left     anterior descending coronary artery with preserved left ventricular     function with mildly inferior hypokinesia. 13.Anxiety. 14.Hyperlipidemia.  CONSULTATION:  Neurology consulted.  Cardiology consulted done by Dr. Viann Fish and Psychiatric also consulted.  HISTORY OF PRESENT ILLNESS:   This is a 77 year old woman with known history of coronary artery disease, status post CABG and MI.  Actual history was kind of changing when on the day of admission, the patient complained of chest pain and shortness of breath and the patient accordingly admitted for evaluation of her chest pain.  During hospital stay, the patient admitted her main reason for admission is cannot walk and feeling dizzy and this is reasonable to believe her blood pressure was borderline with low reading of 92/48 and compared with 142/74, when she first presented.  She also felt like her lower extremity numb.  The patient admitted to the hospital regarding her chest pain.  Chest pain being managed by tele monitoring.  Her cardiac enzyme was negative and Dr. Viann Fish did consult and we appreciate him.  He recommended only medical management, he does not feel the patient have any current chest pain. 1. Dizziness.  The patient's orthostatic blood pressure was     maintained.  She has a kind of acute renal failure which was     managed by IV fluid.  Today, her creatinine improved to BUN of 20,     and creatinine of 1.04, and accordingly  Lasix dose was increased to     40 mg p.o. daily. 2. TSH 1.034 and her vitamin B12 was 395, and free T4 of 110.  The     patient has a history of chronic lymphocytic leukemia, not on any     medication at the time being as the patient is remaining     asymptomatic.  The patient would like to follow up with Dr. Clelia Croft     for further recommendation regarding this __________ the patient's     white blood cells.  The patient treated empirically with Rocephin     for urinary tract infection.  Neurology consulted as the patient     continued to complain of numbness and noticed the patient's ICA     left-to-right mildly stenotic and I do not feel that the patient     has any symptomatic carotid artery disease at the time being.  The     patient was advised to follow up with her  primary care physician     and may have elective atherectomy if needed.  The patient will     continue her dose of Plavix. 3. The patient admitted she has some weight loss, for that CT abdomen     and pelvis and chest  done which did not show any evidence of     malignancy and the patient has a history of CLL which explained     mild splenomegaly the patient has on her CT abdomen.  The patient     suggested to follow up with her MD for further recommendation.  The     patient offered assisted living facility on discharge, currently     the patient refused.  The patient will need to follow up with her     primary care physician.  The patient was advised about different     procedures to relieve her dizziness.  The patient will be     discharged at home health, PT and OT, RN and aide, social worker to     assess.     Deetta Siegmann Bosie Helper, MD     HIE/MEDQ  D:  09/18/2010  T:  09/19/2010  Job:  [id number]  cc:   Lucky Cowboy, M.D.  Electronically Signed by Ebony Cargo MD on 10/10/2010 01:06:05 PM

## 2010-10-31 NOTE — Discharge Summary (Signed)
NAME:  April, Baker NO.:  [id number]   MEDICAL RECORD NO.:  [id number]          PATIENT TYPE:  INP   LOCATION:  3715                         FACILITY:  [facility]   PHYSICIAN:  Georga Hacking, M.D.DATE OF BIRTH:  Nov 05, 1932   DATE OF ADMISSION:  08/15/2008  DATE OF DISCHARGE:  08/18/2008                               DISCHARGE SUMMARY   FINAL DIAGNOSES:  1. Back and chest pain of undetermined etiology - myocardial function      ruled out.      a.     Catheterization this admission showing patent internal       mammary graft to the marginal branch, patent saphenous vein graft       to the right coronary artery, patent stent sites to the LAD,       potential residual sites of ischemia including small diagonal       branches of the LAD and a continuation branch of the circumflex       without major lateral side branches that are not amenable to       percutaneous angioplasty.      b.     Cardiac CT scan showing no evidence of pulmonary emboli or       aortic dissection.  2. Parkinsonism, currently untreated.  3. Anxiety and depression with ongoing divorce.  4. Diabetes with some elements of hypoglycemia this admission.  5. Hyperlipidemia.  6. Severe generalized atherosclerotic vascular disease.   PROCEDURES:  Cardiac catheterization.  CT scan.   CONSULTATIONS:  Dr. Jeanie Sewer.  Dr. Kelli Hope.   HISTORY:  This 77 year old female has a history of coronary artery  disease with bypass grafting in 1994 and previous stenting of the LAD in  2000.  She presented to the emergency room on February 28 with severe  chest pain beginning at 8 a.m. that morning, 9/10 intensity, waxing and  waning, starting in her left back, radiating to her left anterior chest  wall causing her distress.  Initially, nitroglycerin spray was tried  which was without any relief.  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  She was admitted, and a myocardial  infarction was to be ruled out.  Please see the previously dictated  history and physical for remainder of the details.   HOSPITAL COURSE:  The patient was initially admitted by Dr. Anne Fu.  It  became apparent with the patient's daughter spoke that she was having  severe depression and anxiety related to an ongoing divorce. Her  hemoglobin was 12, hematocrit was 34.2.  PT/PTT were normal.  Chemistry  panel shows sodium of 136, potassium 4.4, CO2 28, chloride 103, glucose  of 98, BUN 24, creatinine 1.43.  CPK-MB and troponins were negative  initially.  Digoxin level was 0.4.  Urinalysis was unremarkable.  CT  scan showed calcified granuloma and atelectasis noted.  There was  spurring noted centrally at the T7 level.  Portable chest x-ray showed a  stable appearing chest.  The patient was admitted to the hospital and  continued to have severe  pain relieved with morphine.  She was taken to  the cardiac catheterization laboratory on March 1.  She was noted to  have severe atherosclerotic disease of her iliac arteries.  Left main  coronary artery was normal.  Left anterior descending showed a widely  patent stent site with 30% proximal and mid lesion.  First diagonal  branch had a 60% stenosis, and a small second diagonal branch had a 99%  stenosis.  The circumflex continuation branch was 95-99% with very slow  antegrade flow but supplied very little in the way of lateral side  branches.  Right coronary artery was occluded.  The vein graft to the  right coronary was widely patent with collateral filling of an acute  marginal branch.  The mammary graft to the obtuse marginal was widely  patent.  There is a 60-70% subclavian stenosis prior to the internal  mammary artery graft.   The patient was seen following the catheterization.  She did have some  groin bleeding when she got up, and pressure was reheld.  Femoral artery  ultrasound showed no evidence of pseudoaneurysm.   She continued to  have some episodic back pain and was placed on  Cardizem.  She was seen by cardiac rehabilitation was ambulatory in the  hall without further symptoms.  The etiology of the back pain is unclear  at this time.  It is uncertain whether this is anginal pain, but she has  patent grafts, so there is nothing amenable to percutaneous therapy.  She was noted to have a markedly flat affect and mumbled and talked  almost incessantly.  She was noted to be under significant situational  stress.  She was seen by Dr. Jeanie Sewer who felt that she had an  unspecified mood disorder.  Dr. Thad Ranger saw her for neurology and noted  that the patient had refused to take medicines for Parkinson's  previously but was scheduled for follow-up with him.  She finally wished  to go home, was not really having that much in the way of back pain, and  so we discharged her home in improved condition.   She is to have a home health care nurse and physical therapist to help  with walking as she finds it very difficult to ambulate.  Extensive  discussion was had with daughter concerning the situational stress that  is as involved here.   She is currently on:  1. Fenofibrate 145 mg daily.  2. Lanoxin 0.125 mg daily.  3. Glyburide 5 mg daily.  4. Plavix 75 mg daily.  5. Aspirin 325 mg daily.  6. Meloxicam 15 mg as needed for arthritis.  7. Vitamin D 2000 units 4 tablets daily.  8. Flaxseed oil daily.  9. Omega-3 1000 mg b.i.d.  10.Lutein 20 mg daily.  11.Iron.  12.She is to add sustained-release diltiazem 240 mg daily to her      regimen and also was given a fresh prescription for nitroglycerin.   She did have some hypoglycemia was treated, and when her glyburide was  held, her glucose then went to 300.  It was thought that we would  restart this as she likely was going to be on a less strict diet as an  outpatient.  She is to follow up Dr. Oneta Rack soon regarding her  diabetes.  Sugar was stable at the time of  discharge.  She is discharged  at this time in improved condition to see me in follow-up in 2 weeks.  She is  to report if she has recurrent problems.  She is to see Dr.  Thad Ranger or Dr. Anne Hahn for follow-up of the Parkinson's disease.      Georga Hacking, M.D.  Electronically Signed     WST/MEDQ  D:  08/18/2008  T:  08/18/2008  Job:  [id number]   cc:   Lucky Cowboy, M.D.  Antonietta Breach, M.D.  Michael L. Thad Ranger, M.D.

## 2010-10-31 NOTE — H&P (Signed)
NAME:  April Baker, KUIPERS NO.:  [id number]   MEDICAL RECORD NO.:  [id number]          PATIENT TYPE:  INP   LOCATION:  3715                         FACILITY:  [facility]   PHYSICIAN:  Jake Bathe, MD      DATE OF BIRTH:  [birth year]   DATE OF ADMISSION:  08/15/2008  DATE OF DISCHARGE:                              HISTORY & PHYSICAL   CARDIOLOGIST:  Georga Hacking, MD   CHIEF COMPLAINT:  Back pain/chest pain, similar to her previous  myocardial infarction.   HISTORY OF PRESENT ILLNESS:  A 77 year old female with coronary artery  disease status post bypass by Dr. Andrey Campanile in 1994, with chest pain which  began at 6:00 a.m. this morning, 9/10 in intensity, slightly waxing and  waning, starting at her left back radiating to her left anterior chest  wall causing her distress.  She tried her initial spray nitroglycerin  which was 75 years old without any relief.  She then placed a newer  tablet given by Dr. Donnie Aho recently under her tongue which did help  slightly relieve some discomfort, however, it still remains despite  morphine administration by emergency room physician.  She states that  this pain is very similar to her prior myocardial infarction.  She  denies any shortness of breath, any diaphoresis, nausea, or vomiting.   She has recently been diagnosed with Parkinson disease by Dr. Anne Hahn and  this has been highly stressful for her.  She sustained a fall in  January.  According to her daughter who confided to me, she is going  through a stressful divorce currently.  Her daughter stated that she  would not tell me this personally.   PAST MEDICAL HISTORY:  1. Coronary artery disease status post bypass surgery in 1994, the      patient states she has had subsequent percutaneous intervention to      her coronary arteries but does not know the location or anatomy.  I      do not see this in our electronic medical record.  2. Mitral valve prolapse with mitral  regurgitation.  3. Diabetes.  4. Parkinson disease.  5. Hyperlipidemia.   ALLERGIES:  No known drug allergies.   MEDICATIONS AT HOME:  1. Digoxin 0.125 mg once a day.  2. Plavix 75 mg once a day.  3. Lutein 20 mg a day.  4. Septra double strength twice a day.  5. Lasix 40 mg a day.  6. Fenofibrate 134 mg a day.  7. Fish oil.  8. Flaxseed oil.  9. Glyburide 5 mg a day.   FAMILY HISTORY:  Currently noncontributory.   SOCIAL HISTORY:  She denies any alcohol or smoking.  Her daughter is  currently present with her.  She is currently going through divorce  which is quite stressful, however, as noted in the HPI her daughter told  me this personally and her daughter does not think that she would want  [place] to know that but this may be causing undue stress or discomfort.   REVIEW OF SYSTEMS:  Recent fall.  No syncope.  No bleeding.  No  significant abdominal discomfort.  No shortness of breath.  Unless  specified above, all other 12 review of systems negative.   PHYSICAL EXAMINATION:  VITAL SIGNS:  Blood pressure repeated in the  right arm was 126/40 and 120/33.  In the left arm was 104/36, 106/36,  pulse 78, respirations 20, saturating 100% on room air, and temperature  98.9.  GENERAL:  Mildly anxious appearing in mild distress, alert, although  slightly sedated from morphine.  EYES:  Well-perfused conjunctivae.  EOMI.  No scleral icterus.  NECK:  Supple.  No lymphadenopathy.  No carotid bruits heard.  No JVD  noted.  Full range of motion.  CARDIOVASCULAR:  Regular rate and rhythm with a 2/6 holosystolic murmur,  heard best at apex.  I do not appreciate a diastolic murmur.  Normal  PMI.  LUNGS:  Clear to auscultation bilaterally.  Normal respiratory effort.  Mild crackles heard upon initial inspiration which cleared.  ABDOMEN:  Soft and nontender.  Normoactive bowel sounds.  Mildly  protuberant.  No hepatosplenomegaly appreciated.  EXTREMITIES:  No clubbing, cyanosis, or edema.   Normal distal pulses.  SKIN:  Warm, dry, and intact.  No rashes noted.  NEUROLOGIC:  Mild parkinsonian-like features noted with flattened facial  expressions.  Otherwise nonfocal, no tremor appreciated.  PSYCH:  Mildly anxious as above.   DIAGNOSTIC DATA:  EKG shows sinus rhythm with LVH and T-wave inversion  which is subtle in leads II-VI which maybe repolarization abnormality.  Her ECG from Urgent Care earlier today shows similar findings, although  there was early transition in lead V2 and possible ectopic atrial rhythm  at the time.  No ST elevation or depression noted.  Chest x-ray  personally viewed shows no acute airspace disease.   LABORATORY DATA:  White count mildly elevated at 12.3, hemoglobin 12,  hematocrit 34, and platelets 235.  Sodium 136, potassium 4.4, chloride  103, bicarb 28, BUN 24, creatinine 1.4, and glucose 98.  Digoxin level  0.4, CK 45, MB 2.4, and troponin less than 0.01.   ASSESSMENT AND PLAN:  A 77 year old female with chest pain concerning  for possible acute coronary syndrome or possible aortic dissection with  prior bypass history, diabetes, Parkinson's, and hyperlipidemia.  1. Back pain/chest pain - possible acute coronary syndrome or possible      aortic dissection.  She does have blood pressures as noted above      differing in the right and left arm of up to 20 points systolic.      She also has a widened pulse pressure, although I do not appreciate      a diastolic murmur consistent with aortic insufficiency.  She does      not appear to be in any distress as far as breathing is concerned,      so acute aortic insufficiency does seem less of a possibility.      Nonetheless, I will check a CT angiogram of the aorta to make sure      that there is no evidence of aortic dissection giving her      unrelenting discomfort from back to front.  If aortic dissection      protocol CT is normal, I will then proceed with heparin IV per      acute coronary  syndrome protocol.  I will give her low-dose beta-      blocker, metoprolol 12.5 mg twice a day.  She currently has a  nitroglycerin patch which is sufficient.  Currently, cardiac      biomarkers are reassuring.  ECG as described above does not show      any acute ST changes and T-wave inversions maybe secondary to      repolarization abnormality/left ventricular hypertrophy, however, I      will repeat this in the morning.  Continue with Plavix, aspirin,      oxygen as well as her home digoxin.  Her digoxin level was 0.4.  2. Hyperlipidemia - continue fenofibrate.  3. Diabetes - continue glyburide with insulin sliding scale.  4. Mild renal insufficiency - creatinine is 1.4 today.  With this, I      will give her Mucomyst as well as gentle hydration with 75 mL of IV      fluid overnight for 24 hours given CAT scan.  I will make n.p.o.      past midnight in case she requires further diagnostic workup with      cardiac catheterization.  Recent nuclear stress test in the fall      was reassuring according to daughter.      Jake Bathe, MD  Electronically Signed     MCS/MEDQ  D:  08/15/2008  T:  08/15/2008  Job:  [id number]

## 2010-10-31 NOTE — Cardiovascular Report (Signed)
NAME:  April Baker, April Baker NO.:  [id number]   MEDICAL RECORD NO.:  [id number]          PATIENT TYPE:  INP   LOCATION:  3715                         FACILITY:  [facility]   PHYSICIAN:  Georga Hacking, M.D.DATE OF BIRTH:  Apr 23, 1933   DATE OF PROCEDURE:  08/16/2008  DATE OF DISCHARGE:                            CARDIAC CATHETERIZATION   PROCEDURES:  1. Left heart catheterization with coronary angiograms.  2. Left ventriculogram.  3. Angiograms of the internal mammary artery and saphenous vein graft.   HISTORY:  A 77 year old female with previous bypass grafting and  previous stenting of the LAD, who underwent bypass grafting in 1994 and  later PTCA and stenting of the LAD in 2000.  She presented with  worsening chest discomfort.  It was consistent with unstable angina.   PROCEDURE IN DETAIL:  The patient was prepped and draped in the usual  manner.  After Xylocaine anesthesia, the right femoral artery was  entered using a single anterior needle wall stick.  A Glidewire was  necessary to negotiate significant iliac and aortic atherosclerosis.  This was done through a dilator and then a sheath was placed and the  procedure was done by wire exchange technique following that.  The left  coronary artery was visualized and then the right coronary artery was  visualized and the grafts were visualized using a right coronary  catheter.  A subselective injection was made because of the presence of  significant subclavian stenosis, opacified the internal mammary artery  well.  A 30 mL ventriculogram was performed.  She was removed from the  table.  An ACT was 136.  The sheath was removed in the holding area.   HEMODYNAMIC DATA:  Aorta postcontrast 125/60, LV postcontrast 125/7-14.   ANGIOGRAPHIC DATA:  Left ventriculogram:  Performed in the 30 degrees  RAO projection.  The aortic valve appears normal.  The mitral valve is  normal.  The left ventricle appears normal in size.   There is a  localized area of mid inferior hypokinesis.  The estimated ejection  fraction is 70-75%.  Coronary arteries arise and distribute normally.  Coronary calcification is noted of mild degree.  The left main coronary  artery is normal.  The proximal left anterior descending has a 30%  stenosis.  There is a widely patent stent in the proximal LAD.  First  diagonal branch arises and has an eccentric 60% stenosis and is somewhat  small.  The second diagonal branch is subtotally occluded, but fills  proximally.  There is a midvessel 30% stenosis that is widely patent.  Circumflex coronary artery is subtotally occluded with a 99% proximal  stenosis, faint filling is seen of the continuation branch of the  circumflex and an atrial branch.  The right coronary artery is occluded  proximally.  The saphenous vein graft to the right coronary artery is  widely patent.  The internal mammary graft to the obtuse marginal system  appears widely patent.   IMPRESSION:  1. Severe native coronary artery disease with occlusion of the right      coronary artery and circumflex  with filling of this vessels by      grafts.  2. Long-term patency of the stented site to the left anterior      descending coronary artery with mild residual atherosclerotic      stenosis in the left anterior descending coronary artery.  3. Preserved left ventricular function with mid inferior hypokinesis.  4. Severe atherosclerosis in the distal aortic system.   RECOMMENDATIONS:  Continued medical therapy.      Georga Hacking, M.D.  Electronically Signed     WST/MEDQ  D:  08/16/2008  T:  08/17/2008  Job:  [id number]   cc:   Marlan Palau, M.D.  Lucky Cowboy, M.D.

## 2010-10-31 NOTE — Consult Note (Signed)
NAME:  KAYLINE, SHEER NO.:  [id number]   MEDICAL RECORD NO.:  [id number]          PATIENT TYPE:  INP   LOCATION:  3715                         FACILITY:  [facility]   PHYSICIAN:  Casimiro Needle L. Reynolds, M.D.DATE OF BIRTH:  07-21-1932   DATE OF CONSULTATION:  DATE OF DISCHARGE:                                 CONSULTATION   CHIEF COMPLAINT:  Parkinson disease.   HISTORY OF PRESENT ILLNESS:  This is a pleasant 77 year old female  patient who is a well known established patient of Dr. Anne Hahn at [facility].  Last seen on May 25, 2008.  At that time, the patient was seen for  possibility of Parkinson disease and diagnosis of Parkinson disease was  made.  The patient at that time was placed on 0.5 mg of ReQuip to be  taken once a day and then increased to twice a day.  The patient states  at that time she did not take any other medication as she was fearful  about the side effect and has not taken any medication since then.  She  has a followup appointment on the 23rd of this month, follow up on  Parkinson disease and medication.  Admission to the hospital on August 15, 2008, at [facility], this visit was for chest pain, which  began at 6 a.m., with left side chest pain radiating to the back.  At  that time, she was seen by Dr. Donnie Aho who did a full cardiac workup,  which was negative.  She is not complaining of any chest pain or  discomfort at this time.  During consultation, the patient main concern  from a neurological point of view was understanding Parkinson disease  and understanding why she is having difficulty ambulating and turning  along with getting up from seated position.  A long discussion was made  with the patient stating that the medications that we have placed her on  are to help with dopamine re-uptake, which will help with the symptoms  of Parkinson disease and without taking the medication, she would not  see any benefit.  She was also explained  that most likely all these  symptoms that she is discussing as far as difficulty with ambulation,  turning, and getting up from seated position are secondary to Parkinson  disease.  The patient also exhibited significant amount of anxiety and  stress with events pertaining to personal issues, which she states she  has seen a therapist for at this time.   PAST MEDICAL HISTORY:  1. Diabetes.  2. Hypercholesterolemia.  3. Coronary artery disease.  4. CABG in 1994.  5. Depression.  6. Anxiety.  7. Glaucoma.  8. Left carotid bruit.   HOME MEDICATIONS:  1. Glyburide 5 mg one daily.  2. Digoxin 0.125 mg daily.  3. Meloxicam 1 tab daily.  4. Lasix 40 mg daily.  5. Plavix 75 mg daily.  6. Fenofibrate 134 mg daily.  7. Iron 27 mg daily.  8. Lutein 20 mg daily.   ALLERGIES:  No known drug, environmental, or food allergies.   FAMILY HISTORY:  Noncontributory.   SOCIAL HISTORY:  The patient does not smoke, drink, or do illicit drugs.   REVIEW OF SYSTEMS:  All negative with the exception of above.   PHYSICAL EXAMINATION:  VITAL SIGNS:  Blood pressure is 146/55, pulse 64,  respiratory rate 20, and temperature 97.  MENTAL STATUS:  She is alert.  She is oriented x3.  She carries out 2-  and 3-step commands without difficulty.  She does have a blunted affect  with force-like speech, which is very monotone and flat facies.  Cranial  nerves, pupils are equal, round, and reactive to light and accommodate.  Conjugate gaze.  Extraocular muscles are intact.  Visual fields intact.  Face symmetrical.  Tongue is midline.  Head is atraumatic.  Neck is  supple.  She does have a left carotid bruit that is noted.  Respiratory  examination is clear.  Cardiovascular examination reveals grade 2/6  systolic ejection murmur at the aortic area.  Otherwise, regular rate  and rhythm.  Extremities were without any significant edema.  Motor  examination reveals good strength in all 4 extremities.  Good   symmetrical tone is noted throughout.  No cogwheeling or rigidity, and  passive flexion, extension.  Sensory exam reveals decreased pinprick  sensation across the ankles bilaterally.  The patient, otherwise, is  symmetrical to vibration and position sense throughout.  Cerebellar  examination shows good finger-to-nose and heel-to-shin.  She has  negative Romberg, but her gait is shuffle, slightly stooped and she does  have multiple step turning.  She does not have retropulsion.  Deep  tendon reflexes are symmetrical throughout with downgoing toes.   IMAGING TEST:  No CTs.  No head CT was obtained at this time.   ASSESSMENT:  At this time, the patient does have significant amount of  parkinsonian-like symptom and most likely has Parkinson disease,  however, she has not been taking her ReQuip, which was prescribed for  her by Dr. Anne Hahn on last visit.   RECOMMENDATIONS:  1. Continue taking medication that she has at home, which should be      0.5 mg of ReQuip.  She is to be taking 1 tablet daily for 1 week      and then go to 1 tablet 3 times a day.  2. She is to follow up with Dr. Anne Hahn on regular scheduled      appointment on August 10, 2008.     ______________________________  Felicie Morn, PA-C      Marolyn Hammock. Thad Ranger, M.D.  Electronically Signed    DS/MEDQ  D:  08/17/2008  T:  08/18/2008  Job:  [id number]

## 2010-10-31 NOTE — Consult Note (Signed)
NAME:  ALEXXA, SABET NO.:  [id number]   MEDICAL RECORD NO.:  [id number]          PATIENT TYPE:  INP   LOCATION:  3715                         FACILITY:  [facility]   PHYSICIAN:  Antonietta Breach, M.D.  DATE OF BIRTH:  1933/04/23   DATE OF CONSULTATION:  08/18/2008  DATE OF DISCHARGE:  08/18/2008                                 CONSULTATION   REASON FOR CONSULTATION:  Depression/anxiety.   REQUESTING PHYSICIAN:  Georga Hacking, MD   HISTORY OF PRESENT ILLNESS:  Ms. Dimitra Woodstock is a 77 year old female  admitted to the [facility] on August 15, 2008, due to chest pain.   Ms. Haffey has Parkinson disease and she fell in January 2010.  She has  been experiencing intensity and some decreased concentration; however,  she denies any decreased interest.  She describes interests in ball  games and friends.  She has constructive future goals.  She has no  thoughts of harming herself or others.  She has no hallucinations or  delusions.   Her orientation and memory function are intact.   The patient does have mitral valve prolapse with regurg as well as  Parkinson disease that are associated with her decreased movement and  energy.   PAST PSYCHIATRIC HISTORY:  In review of the past medical record, there  is no psychiatric diagnosis listed.  She also does not have any  psychotropic medication listed.  She denies any history of major  depression.   FAMILY PSYCHIATRIC HISTORY:  None known.   SOCIAL HISTORY:  Ms. Friesen has a daughter and grandchildren.  She was a  Therapist, occupational in [place] for 26 years.  This is her second marriage.   Occupation retired.   PAST MEDICAL HISTORY:  1. Parkinson disease.  2. Coronary artery disease.  3. History of myocardial infarction.  4. Mitral valve prolapse with regurgitation.  5. Diabetes mellitus.  6. Hyperlipidemia.  7. History of CABG.   MEDICATIONS:  Her MAR is reviewed.  She is on Xanax 0.5 mg nightly  p.r.n.  insomnia.   ALLERGIES:  She has no known drug allergies.   LABORATORY DATA:  Sodium 141, BUN 17, creatinine 1.15, glucose 84, WBC  14.7, hemoglobin 11.5, platelet count 233.  Head CT without contrast on  June 17, 2009, showed no acute abnormality.   REVIEW OF SYSTEMS:  Constitutional, head, eyes, ears, nose, throat,  mouth, neurologic, psychiatric, cardiovascular, respiratory,  gastrointestinal, genitourinary, skin, musculoskeletal, hematologic,  lymphatic, endocrine, metabolic; all unremarkable.   PHYSICAL EXAMINATION:  VITAL SIGNS:  Temperature 97.4, pulse 76,  respiratory rate 18, blood pressure 142/59, O2 saturation on room air  92%.  GENERAL APPEARANCE:  Ms. Stetzer is an elderly female lying in a supine  position in her hospital bed with no abnormal involuntary movements.   MENTAL STATUS EXAM:  Ms. Ratliff is alert.  Her attention span is normal.  Her affect is mildly flat at baseline, but with a broad and appropriate  range as the interview progresses.  Her mood is within normal limits.  Her concentration is normal.  She is oriented completely to  all spheres  on memory testing.  She is intact to immediate recent and remote.  Her  fund of knowledge and intelligence are normal.  Speech involves normal  rate and prosody.  Of note, she does have some difficulty with  remembering some significant medical events of the past (also her  daughter notes occasional impaired memory for events).   Thought process:  Logical, coherent, goal-directed.  No looseness of  associations.  Thought content:  No thoughts of harming herself.  No  thoughts of harming others.  No delusions or hallucinations.  Insight is  intact.  Judgment is intact.   ASSESSMENT:  AXIS I:  294.9 unspecified persistent mental disorder, not  otherwise specified.  This category is utilized to refer to some slight  recent and remote memory difficulty, which may be secondary to her  Parkinson's disease.  However, she  does not have the criteria for  dementia.  Of note her flat affect is consistent with her diagnosis of  Parkinson's disease.   293.80 mood disorder, not otherwise specified.  This is utilized to  indicate that she does have some decreased energy that might benefit  from an antidepressant; however, the patient declined and she does not  meet any criteria for mandatory care.   AXIS II:  None.   AXIS III:  See past medical history.   AXIS IV:  General medical.   AXIS V:  55.   Ms. Dresden does have the capacity for informed consent.  She agrees to  call emergency services for any psychiatric emergency symptoms.   As mentioned above, she declines any psychotropic medication.   Regarding memory difficulty, if significant memory difficulty is  assessed over time, she could possibly benefit from a trial with Aricept  starting at 5 mg nightly with caution about cholinergic adverse  gastrointestinal effects such as nausea or loose stools.   If there is any worsening in mental symptoms, would reconsult  psychiatry.  As stated above, Ms. Green declines care and she does not  meet any criteria for and mandatory care.      Antonietta Breach, M.D.  Electronically Signed     JW/MEDQ  D:  08/31/2008  T:  09/01/2008  Job:  [id number]

## 2010-11-03 NOTE — Op Note (Signed)
NAME:  April Baker, April Baker NO.:  [id number]   MEDICAL RECORD NO.:  [id number]                   PATIENT TYPE:  OIB   LOCATION:  2899                                 FACILITY:  [facility]   PHYSICIAN:  Gwenith Daily. Tyrone Sage, M.D.            DATE OF BIRTH:  1932/09/05   DATE OF PROCEDURE:  10/29/2003  DATE OF DISCHARGE:  10/29/2003                                 OPERATIVE REPORT   PREOPERATIVE DIAGNOSIS:  Painful sternal wires.   POSTOPERATIVE DIAGNOSIS:  Painful sternal wires.   SURGICAL PROCEDURE:  Removal of upper two sternal wires and debridement of  abnormal bone production, midsternum.   SURGEON:  Gwenith Daily. Tyrone Sage, M.D.   BRIEF HISTORY:  The patient had previously undergone coronary artery bypass  grafting approximately seven years ago by Dr. Andrey Campanile.  Over the last several  months, she had noted increasing pain in her upper sternum and sought  attention from general surgery.  A CT scan of the chest was obtained.  The  patient was referred to thoracic surgery.  On chest x-ray and CT scan, the  upper sternal wire, which was broken and was palpably painful on physical  examination.  She also, over the second sternal wire, had thinning of the  skin and also area of tenderness.  Because of the continued pain, in spite  of conservative therapy, it was recommended that patient have removal of the  painful sternal wires.  She agreed and signed informed consent.   DESCRIPTION OF PROCEDURE:  With monitored anesthesia care, because of her  known coronary disease and the unknown extent of the removal of the wires,  with light intravenous sedation, the chest was prepped with Betadine and  draped in the usual sterile manner.  Lidocaine 1% was infiltrated in the  upper sternal area, first over the upper wire.  A small, less than 0.5 inch,  incision was made over the sternal wire.  Dissection was carried down to the  fastening brad and both segments of the wire were  removed without  difficulty.  A second incision also, approximately 0.5 inch in length, was  made over the lower sternal wire.  In this area, there was a significant  amount of osteophytic bone production around the wire.  The second sternal  wire was cut and removed and using a bone rongeur, the abnormal surrounding  bone growth was debrided until flap.  There was no evidence of infection.  Each of the incisions was closed with interrupted 3-0 Vicryls in the deep  tissue and a 4-0 subcuticular stitch in the skin edges.  Dry dressings were  applied.  The patient tolerated the procedure without obvious complications.  There was no blood loss.  Gwenith Daily Tyrone Sage, M.D.    Tyson Babinski  D:  10/31/2003  T:  10/31/2003  Job:  [id number]

## 2010-12-26 ENCOUNTER — Emergency Department (HOSPITAL_COMMUNITY): Payer: Medicare Other

## 2010-12-26 ENCOUNTER — Emergency Department (HOSPITAL_COMMUNITY)
Admission: EM | Admit: 2010-12-26 | Discharge: 2010-12-26 | Disposition: A | Discharge status: Home / Self Care | Payer: Medicare Other | Attending: Emergency Medicine | Admitting: Emergency Medicine

## 2010-12-26 DIAGNOSIS — M25559 Pain in unspecified hip: Secondary | ICD-10-CM | POA: Insufficient documentation

## 2010-12-26 DIAGNOSIS — I4892 Unspecified atrial flutter: Secondary | ICD-10-CM | POA: Insufficient documentation

## 2010-12-26 DIAGNOSIS — S7000XA Contusion of unspecified hip, initial encounter: Secondary | ICD-10-CM | POA: Insufficient documentation

## 2010-12-26 DIAGNOSIS — I4891 Unspecified atrial fibrillation: Secondary | ICD-10-CM | POA: Insufficient documentation

## 2010-12-26 DIAGNOSIS — S0003XA Contusion of scalp, initial encounter: Secondary | ICD-10-CM | POA: Insufficient documentation

## 2010-12-26 DIAGNOSIS — M25519 Pain in unspecified shoulder: Secondary | ICD-10-CM | POA: Insufficient documentation

## 2010-12-26 DIAGNOSIS — W19XXXA Unspecified fall, initial encounter: Secondary | ICD-10-CM | POA: Insufficient documentation

## 2010-12-26 DIAGNOSIS — Y92009 Unspecified place in unspecified non-institutional (private) residence as the place of occurrence of the external cause: Secondary | ICD-10-CM | POA: Insufficient documentation

## 2010-12-26 DIAGNOSIS — R079 Chest pain, unspecified: Secondary | ICD-10-CM | POA: Insufficient documentation

## 2010-12-26 DIAGNOSIS — I10 Essential (primary) hypertension: Secondary | ICD-10-CM | POA: Insufficient documentation

## 2010-12-26 DIAGNOSIS — H5710 Ocular pain, unspecified eye: Secondary | ICD-10-CM | POA: Insufficient documentation

## 2010-12-26 DIAGNOSIS — S20219A Contusion of unspecified front wall of thorax, initial encounter: Secondary | ICD-10-CM | POA: Insufficient documentation

## 2010-12-26 DIAGNOSIS — G20 Parkinson's disease: Secondary | ICD-10-CM | POA: Insufficient documentation

## 2010-12-26 DIAGNOSIS — S0180XA Unspecified open wound of other part of head, initial encounter: Secondary | ICD-10-CM | POA: Insufficient documentation

## 2010-12-26 DIAGNOSIS — G20A1 Parkinson's disease without dyskinesia, without mention of fluctuations: Secondary | ICD-10-CM | POA: Insufficient documentation

## 2010-12-26 DIAGNOSIS — H5789 Other specified disorders of eye and adnexa: Secondary | ICD-10-CM | POA: Insufficient documentation

## 2010-12-26 DIAGNOSIS — S060X0A Concussion without loss of consciousness, initial encounter: Secondary | ICD-10-CM | POA: Insufficient documentation

## 2011-01-26 ENCOUNTER — Ambulatory Visit: Payer: Medicare Other | Attending: Neurology | Admitting: Physical Therapy

## 2011-01-26 DIAGNOSIS — G20 Parkinson's disease: Secondary | ICD-10-CM | POA: Insufficient documentation

## 2011-01-26 DIAGNOSIS — G20A1 Parkinson's disease without dyskinesia, without mention of fluctuations: Secondary | ICD-10-CM | POA: Insufficient documentation

## 2011-01-26 DIAGNOSIS — IMO0001 Reserved for inherently not codable concepts without codable children: Secondary | ICD-10-CM | POA: Insufficient documentation

## 2011-01-26 DIAGNOSIS — R269 Unspecified abnormalities of gait and mobility: Secondary | ICD-10-CM | POA: Insufficient documentation

## 2011-01-29 ENCOUNTER — Ambulatory Visit: Payer: Medicare Other | Admitting: Physical Therapy

## 2011-02-02 ENCOUNTER — Ambulatory Visit: Payer: Medicare Other | Admitting: Physical Therapy

## 2011-02-03 ENCOUNTER — Emergency Department (HOSPITAL_COMMUNITY)
Admission: EM | Admit: 2011-02-03 | Discharge: 2011-02-03 | Disposition: A | Discharge status: Home / Self Care | Payer: Medicare Other | Attending: Emergency Medicine | Admitting: Emergency Medicine

## 2011-02-03 ENCOUNTER — Emergency Department (HOSPITAL_COMMUNITY): Payer: Medicare Other

## 2011-02-03 DIAGNOSIS — M25539 Pain in unspecified wrist: Secondary | ICD-10-CM | POA: Insufficient documentation

## 2011-02-03 DIAGNOSIS — Y92009 Unspecified place in unspecified non-institutional (private) residence as the place of occurrence of the external cause: Secondary | ICD-10-CM | POA: Insufficient documentation

## 2011-02-03 DIAGNOSIS — S52509A Unspecified fracture of the lower end of unspecified radius, initial encounter for closed fracture: Secondary | ICD-10-CM | POA: Insufficient documentation

## 2011-02-03 DIAGNOSIS — G20A1 Parkinson's disease without dyskinesia, without mention of fluctuations: Secondary | ICD-10-CM | POA: Insufficient documentation

## 2011-02-03 DIAGNOSIS — W010XXA Fall on same level from slipping, tripping and stumbling without subsequent striking against object, initial encounter: Secondary | ICD-10-CM | POA: Insufficient documentation

## 2011-02-03 DIAGNOSIS — G20 Parkinson's disease: Secondary | ICD-10-CM | POA: Insufficient documentation

## 2011-02-03 DIAGNOSIS — I10 Essential (primary) hypertension: Secondary | ICD-10-CM | POA: Insufficient documentation

## 2011-02-06 ENCOUNTER — Ambulatory Visit: Payer: Medicare Other | Admitting: Physical Therapy

## 2011-02-07 ENCOUNTER — Ambulatory Visit: Payer: Medicare Other | Admitting: Physical Therapy

## 2011-02-12 ENCOUNTER — Inpatient Hospital Stay (HOSPITAL_COMMUNITY)
Admission: EM | Admit: 2011-02-12 | Discharge: 2011-02-16 | DRG: 690 | Disposition: A | Discharge status: Skilled Nursing Facility | Payer: Medicare Other | Attending: Internal Medicine | Admitting: Internal Medicine

## 2011-02-12 ENCOUNTER — Emergency Department (HOSPITAL_COMMUNITY): Payer: Medicare Other

## 2011-02-12 DIAGNOSIS — I4891 Unspecified atrial fibrillation: Secondary | ICD-10-CM | POA: Diagnosis present

## 2011-02-12 DIAGNOSIS — I2510 Atherosclerotic heart disease of native coronary artery without angina pectoris: Secondary | ICD-10-CM | POA: Diagnosis present

## 2011-02-12 DIAGNOSIS — F0280 Dementia in other diseases classified elsewhere without behavioral disturbance: Secondary | ICD-10-CM | POA: Diagnosis present

## 2011-02-12 DIAGNOSIS — Z9861 Coronary angioplasty status: Secondary | ICD-10-CM

## 2011-02-12 DIAGNOSIS — W010XXA Fall on same level from slipping, tripping and stumbling without subsequent striking against object, initial encounter: Secondary | ICD-10-CM | POA: Diagnosis present

## 2011-02-12 DIAGNOSIS — Y92009 Unspecified place in unspecified non-institutional (private) residence as the place of occurrence of the external cause: Secondary | ICD-10-CM

## 2011-02-12 DIAGNOSIS — R5381 Other malaise: Secondary | ICD-10-CM | POA: Diagnosis present

## 2011-02-12 DIAGNOSIS — E119 Type 2 diabetes mellitus without complications: Secondary | ICD-10-CM | POA: Diagnosis present

## 2011-02-12 DIAGNOSIS — Z951 Presence of aortocoronary bypass graft: Secondary | ICD-10-CM

## 2011-02-12 DIAGNOSIS — N390 Urinary tract infection, site not specified: Principal | ICD-10-CM | POA: Diagnosis present

## 2011-02-12 DIAGNOSIS — I252 Old myocardial infarction: Secondary | ICD-10-CM

## 2011-02-12 DIAGNOSIS — Z9181 History of falling: Secondary | ICD-10-CM

## 2011-02-12 DIAGNOSIS — E440 Moderate protein-calorie malnutrition: Secondary | ICD-10-CM | POA: Diagnosis present

## 2011-02-13 ENCOUNTER — Emergency Department (HOSPITAL_COMMUNITY): Payer: Medicare Other

## 2011-02-13 ENCOUNTER — Ambulatory Visit: Payer: Medicare Other | Admitting: Physical Therapy

## 2011-02-13 LAB — URINE MICROSCOPIC-ADD ON

## 2011-02-13 LAB — CBC
HCT: 33.6 % — ABNORMAL LOW (ref 36.0–46.0)
Hemoglobin: 10.9 g/dL — ABNORMAL LOW (ref 12.0–15.0)
MCH: 28.2 pg (ref 26.0–34.0)
MCV: 87 fL (ref 78.0–100.0)
RBC: 3.86 MIL/uL — ABNORMAL LOW (ref 3.87–5.11)

## 2011-02-13 LAB — GLUCOSE, CAPILLARY
Glucose-Capillary: 205 mg/dL — ABNORMAL HIGH (ref 70–99)
Glucose-Capillary: 115 mg/dL — ABNORMAL HIGH (ref 70–99)

## 2011-02-13 LAB — RPR: RPR Ser Ql: NONREACTIVE

## 2011-02-13 LAB — BASIC METABOLIC PANEL
BUN: 29 mg/dL — ABNORMAL HIGH (ref 6–23)
CO2: 32 mEq/L (ref 19–32)
Calcium: 10.4 mg/dL (ref 8.4–10.5)
Glucose, Bld: 225 mg/dL — ABNORMAL HIGH (ref 70–99)
Sodium: 137 mEq/L (ref 135–145)

## 2011-02-13 LAB — DIFFERENTIAL
Basophils Relative: 0 % (ref 0–1)
Eosinophils Relative: 1 % (ref 0–5)
Lymphs Abs: 12.9 10*3/uL — ABNORMAL HIGH (ref 0.7–4.0)
Monocytes Relative: 3 % (ref 3–12)
Neutro Abs: 4.8 10*3/uL (ref 1.7–7.7)

## 2011-02-13 LAB — URINALYSIS, ROUTINE W REFLEX MICROSCOPIC
Glucose, UA: 100 mg/dL — AB
Hgb urine dipstick: NEGATIVE
Protein, ur: 30 mg/dL — AB

## 2011-02-13 NOTE — Consult Note (Signed)
  NAME:  TAYLR, MEUTH NO.:  [id number]  MEDICAL RECORD NO.:  [id number]  LOCATION:  [facility]                         FACILITY:  [facility]  PHYSICIAN:  Artist Pais. Zanden Colver, M.D.DATE OF BIRTH:  1933-05-23  DATE OF CONSULTATION:  02/03/2011 DATE OF DISCHARGE:                                CONSULTATION   PHYSICIAN REQUESTING CONSULTATION:  Nelva Nay, MD  REASON FOR CONSULTATION:  April Baker is a 77 year old female right- hand dominant, significant past medical history for Parkinson, presents today with status post a fall with displaced distal radius fracture, right side.  She has same medications, which were listed and documented in her chart.  She has drug allergies, which were listed and documented in her chart.  She fell earlier today.  PHYSICAL EXAMINATION:  Reveals obvious deformity.  Neurosensory exam is intact.  Skin is intact.  No evidence of open wounds with no obvious deformity to the hand and wrist on her right side.  X-ray showed displaced distal radius fracture and a minimally displaced ulnar styloid fracture.  The patient was given a 2% plain lidocaine. Hematoma block was placed in finger trap traction.  Closed reduction was performed.  She was placed in a well-padded sugar-tong splint. Postreduction films were order.  She will follow up in my office this Thursday to discuss further treatment options.     Artist Pais Mina Marble, M.D.     MAW/MEDQ  D:  02/03/2011  T:  02/03/2011  Job:  [id number]  Electronically Signed by Dairl Ponder M.D. on 02/13/2011 09:36:37 AM
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
## 2011-02-14 DIAGNOSIS — F39 Unspecified mood [affective] disorder: Secondary | ICD-10-CM

## 2011-02-14 LAB — CBC
MCHC: 31.9 g/dL (ref 30.0–36.0)
Platelets: 279 10*3/uL (ref 150–400)
RDW: 14 % (ref 11.5–15.5)
WBC: 18.1 10*3/uL — ABNORMAL HIGH (ref 4.0–10.5)

## 2011-02-14 LAB — GLUCOSE, CAPILLARY
Glucose-Capillary: 183 mg/dL — ABNORMAL HIGH (ref 70–99)
Glucose-Capillary: 78 mg/dL (ref 70–99)
Glucose-Capillary: 194 mg/dL — ABNORMAL HIGH (ref 70–99)

## 2011-02-14 LAB — BASIC METABOLIC PANEL
Chloride: 102 mEq/L (ref 96–112)
GFR calc Af Amer: >60 mL/min (ref >60)
GFR calc non Af Amer: 52 mL/min — ABNORMAL LOW (ref >60)
Potassium: 3.5 mEq/L (ref 3.5–5.1)
Sodium: 140 mEq/L (ref 135–145)

## 2011-02-14 LAB — FOLATE RBC: RBC Folate: 582 ng/mL (ref >=366)

## 2011-02-15 LAB — GLUCOSE, CAPILLARY
Glucose-Capillary: 122 mg/dL — ABNORMAL HIGH (ref 70–99)
Glucose-Capillary: 155 mg/dL — ABNORMAL HIGH (ref 70–99)
Glucose-Capillary: 124 mg/dL — ABNORMAL HIGH (ref 70–99)

## 2011-02-16 ENCOUNTER — Ambulatory Visit: Payer: Medicare Other | Admitting: Physical Therapy

## 2011-02-16 DIAGNOSIS — F39 Unspecified mood [affective] disorder: Secondary | ICD-10-CM

## 2011-02-16 LAB — URINE CULTURE
Colony Count: >=100000
Culture  Setup Time: 201208282146

## 2011-02-16 LAB — GLUCOSE, CAPILLARY: Glucose-Capillary: 78 mg/dL (ref 70–99)
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
## 2011-02-21 ENCOUNTER — Ambulatory Visit: Payer: Medicare Other | Admitting: Physical Therapy

## 2011-02-21 NOTE — Consult Note (Signed)
  [name], April Baker NO.:  [id number]  MEDICAL RECORD NO.:  [id number]  LOCATION:  1516                         FACILITY:  [facility]  PHYSICIAN:  Eulogio Ditch, MD DATE OF BIRTH:  [birth year]  DATE OF CONSULTATION:  02/14/2011 DATE OF DISCHARGE:                                CONSULTATION   REASON FOR CONSULTATION:  Capacity evaluation.  HISTORY OF PRESENT ILLNESS:  This is a 77 year old Caucasian female with a history of Parkinson disease and frequent falls at home was brought to the hospital because of the fall.  MENTAL STATUS EXAMINATION:  The patient is very calm, cooperative, pleasant on approach.  The patient reported that she is in the hospital because she broke her arm and is not getting sufficient care at home. The caregiver comes only 2 or 3 times a week.  She told the name of the caregiver, Delice Bison.  She stated that daughter works and has 2 children, so she cannot be there all the time.  The patient stated that she wants to go to rehab/skilled nursing facility at this time.  The patient is logical and goal directed, not hallucinating or delusional.  She is alert, awake, oriented x3.  On asking about suicidal ideations, the patient stated that she wants to live a long life.  Her memory, immediate is fair, recent poor, remote poor.  Attention and concentration fair.  Abstraction ability fair.  Insight and judgment fair.  MEDICAL HISTORY:  History of diabetes mellitus, atrial fibrillation, mitral valve regurgitation, myocardial infarction, Parkinson disease, coronary artery disease.  ALLERGIES:  The patient is allergic to latex.  SUBSTANCE ABUSE HISTORY:  None present.  DIAGNOSES:  Axis I:  Dementia, not otherwise specified. Axis II:  Deferred. Axis III:  See medical notes. Axis IV:  Chronic medical issues, frequent falls. Axis V:  50.  RECOMMENDATIONS:  At this time, the patient has capacity to make decision to go to nursing facility.  The  patient is agreeable to go to nursing facility.  Thanks for involving me in taking care of this patient.     Eulogio Ditch, MD     SA/MEDQ  D:  02/14/2011  T:  02/14/2011  Job:  [id number]  Electronically Signed by Eulogio Ditch  on 02/21/2011 02:59:00 PM

## 2011-02-23 ENCOUNTER — Ambulatory Visit: Payer: Medicare Other | Admitting: Physical Therapy

## 2011-03-07 ENCOUNTER — Other Ambulatory Visit: Payer: Self-pay | Admitting: Endocrinology

## 2011-03-07 ENCOUNTER — Ambulatory Visit
Admission: RE | Admit: 2011-03-07 | Discharge: 2011-03-07 | Disposition: A | Discharge status: Home / Self Care | Payer: Medicare Other | Source: Ambulatory Visit | Attending: Endocrinology | Admitting: Endocrinology

## 2011-03-07 DIAGNOSIS — R410 Disorientation, unspecified: Secondary | ICD-10-CM

## 2011-03-07 MED ORDER — IOHEXOL 300 MG/ML  SOLN
75.0000 mL | Freq: Once | INTRAMUSCULAR | Status: AC | PRN
  Administered 2011-03-07: 75 mL via INTRAVENOUS

## 2011-03-23 LAB — URINALYSIS, ROUTINE W REFLEX MICROSCOPIC
Bilirubin Urine: NEGATIVE
Glucose, UA: NEGATIVE mg/dL
Hgb urine dipstick: NEGATIVE
Ketones, ur: NEGATIVE mg/dL
Nitrite: NEGATIVE
Protein, ur: NEGATIVE mg/dL
Specific Gravity, Urine: 1.008 (ref 1.005–1.030)
Urobilinogen, UA: 0.2 mg/dL (ref 0.0–1.0)
pH: 8 (ref 5.0–8.0)

## 2011-05-13 ENCOUNTER — Emergency Department (HOSPITAL_COMMUNITY)
Admission: EM | Admit: 2011-05-13 | Discharge: 2011-05-13 | Disposition: A | Discharge status: Home / Self Care | Payer: Medicare Other | Source: Home / Self Care | Attending: Emergency Medicine | Admitting: Emergency Medicine

## 2011-05-13 ENCOUNTER — Encounter (HOSPITAL_COMMUNITY): Payer: Self-pay | Admitting: Emergency Medicine

## 2011-05-13 DIAGNOSIS — Y9210 Unspecified residential institution as the place of occurrence of the external cause: Secondary | ICD-10-CM | POA: Insufficient documentation

## 2011-05-13 DIAGNOSIS — G20 Parkinson's disease: Secondary | ICD-10-CM | POA: Insufficient documentation

## 2011-05-13 DIAGNOSIS — W010XXA Fall on same level from slipping, tripping and stumbling without subsequent striking against object, initial encounter: Secondary | ICD-10-CM | POA: Insufficient documentation

## 2011-05-13 DIAGNOSIS — G20A1 Parkinson's disease without dyskinesia, without mention of fluctuations: Secondary | ICD-10-CM | POA: Insufficient documentation

## 2011-05-13 DIAGNOSIS — S0990XA Unspecified injury of head, initial encounter: Secondary | ICD-10-CM

## 2011-05-13 DIAGNOSIS — I10 Essential (primary) hypertension: Secondary | ICD-10-CM | POA: Insufficient documentation

## 2011-05-13 DIAGNOSIS — R51 Headache: Secondary | ICD-10-CM | POA: Insufficient documentation

## 2011-05-13 DIAGNOSIS — W19XXXA Unspecified fall, initial encounter: Secondary | ICD-10-CM

## 2011-05-13 DIAGNOSIS — I4891 Unspecified atrial fibrillation: Secondary | ICD-10-CM | POA: Insufficient documentation

## 2011-05-13 HISTORY — DX: Unspecified atrial fibrillation: I48.91

## 2011-05-13 NOTE — ED Notes (Signed)
Daughter here at bedisde. States pt is fine. States she is poa and would like to take pt back to nursing home. Dr. Fredricka Bonine aware. States he will see pt now.

## 2011-05-13 NOTE — ED Notes (Addendum)
Fell at nursing home as she was standing up from table. No loc. No injuries noted there. Now complaining of R wrist pain. Nursing home policy to send to ER for eval after fall.

## 2011-05-13 NOTE — ED Provider Notes (Signed)
History     CSN: [id number] Arrival date & time: 05/13/2011  1:29 PM   None     Chief Complaint  Patient presents with  . Fall    (Consider location/radiation/quality/duration/timing/severity/associated sxs/prior treatment) HPI Comments:  The patient is a 77 year old female who while getting up from the table at lunch at her nursing home, she tripped over a leg of the table and fell to the ground bumping the left occipital portion of her head on the ground, but without any loss of consciousness. She reported initially some mild tenderness to palpation at the left occipital region of her scalp where she had a small bump, but that is gone now, and there was no radiation of the pain, the pain was dull and tender, and has resolved. The pain was mild in severity, and she had no associated nausea, vomiting, loss of consciousness, confusion, decreased alertness, neck pain, back pain, arm or leg injury with the fall. She was brought in by EMS for evaluation for any potential injuries. She voices no complaints of any pain at this time. Her family is at the bedside with her and they're requesting to take her home.  Patient is a 77 y.o. female presenting with fall. The history is provided by the patient, the nursing home, the EMS personnel and a relative.  Fall The accident occurred 1 to 2 hours ago. The fall occurred while standing. She fell from a height of 1 to 2 ft. She landed on a hard floor. There was no blood loss. The point of impact was the head. Pain location: No pain at the present time. The pain is at a severity of 0/10. The patient is experiencing no pain. She was ambulatory at the scene. There was no entrapment after the fall. There was no drug use involved in the accident. There was no alcohol use involved in the accident. Pertinent negatives include no visual change, no fever, no numbness, no abdominal pain, no bowel incontinence, no nausea, no vomiting, no hematuria, no headaches, no hearing  loss, no loss of consciousness and no tingling. She has tried nothing for the symptoms.    Past Medical History  Diagnosis Date  . Hypertension   . Atrial fibrillation   . Parkinson's disease     History reviewed. No pertinent past surgical history.  No family history on file.  History  Substance Use Topics  . Smoking status: Never Smoker   . Smokeless tobacco: Not on file  . Alcohol Use: No    OB History    Grav Para Term Preterm Abortions TAB SAB Ect Mult Living                  Review of Systems  Constitutional: Negative for fever and fatigue.  HENT: Negative for nosebleeds, rhinorrhea, neck pain, neck stiffness, postnasal drip and tinnitus.   Eyes: Negative.   Respiratory: Negative.   Cardiovascular: Negative.   Gastrointestinal: Negative.  Negative for nausea, vomiting, abdominal pain and bowel incontinence.  Genitourinary: Negative for hematuria.  Musculoskeletal: Negative for back pain, joint swelling and arthralgias.  Skin: Negative for rash and wound.  Neurological: Negative for tingling, loss of consciousness, syncope, numbness and headaches.  Psychiatric/Behavioral: Negative for confusion.    Allergies  Latex  Home Medications  No current outpatient prescriptions on file.  BP 110/64  Pulse 75  Temp 98 F (36.7 C)  SpO2 98%  Physical Exam  Nursing note and vitals reviewed. Constitutional: She is oriented to person, place,  and time. She appears well-developed and well-nourished. No distress.  HENT:  Head: Normocephalic and atraumatic. Head is without raccoon's eyes, without Battle's sign, without abrasion and without contusion.  Right Ear: External ear normal.  Left Ear: External ear normal.  Nose: Nose normal.  Mouth/Throat: Oropharynx is clear and moist.  Eyes: EOM are normal. Pupils are equal, round, and reactive to light.  Neck: Normal range of motion. Neck supple.  Cardiovascular: Normal rate and regular rhythm.   Pulmonary/Chest: Effort  normal and breath sounds normal. She exhibits no tenderness.  Abdominal: Soft. There is no tenderness.  Musculoskeletal: Normal range of motion. She exhibits no edema and no tenderness.  Neurological: She is alert and oriented to person, place, and time. She has normal reflexes.  Skin: Skin is warm and dry. No rash noted. No erythema.  Psychiatric: She has a normal mood and affect.    ED Course  Procedures (including critical care time)  Labs Reviewed - No data to display No results found.   No diagnosis found.    MDM  Apparent trauma on the physical examination. The patient has sustained a minor head injury, and I do not suspect intracranial injury. I will discharge her home for observation over the next 24 hours by nursing home personnel, to return to the emergency department if signs or symptoms of developing head injury present. The patient and her family state their understanding of and agreement with this plan of care.        Felisa Bonier, MD 05/13/11 [phone]

## 2011-05-14 ENCOUNTER — Other Ambulatory Visit: Payer: Self-pay | Admitting: Orthopedic Surgery

## 2011-05-14 ENCOUNTER — Emergency Department (HOSPITAL_COMMUNITY): Payer: Medicare Other

## 2011-05-14 ENCOUNTER — Other Ambulatory Visit: Payer: Self-pay

## 2011-05-14 ENCOUNTER — Encounter (HOSPITAL_COMMUNITY): Payer: Self-pay | Admitting: Emergency Medicine

## 2011-05-14 ENCOUNTER — Inpatient Hospital Stay (HOSPITAL_COMMUNITY)
Admission: EM | Admit: 2011-05-14 | Discharge: 2011-05-18 | DRG: 511 | Disposition: A | Discharge status: Skilled Nursing Facility | Payer: Medicare Other | Attending: Internal Medicine | Admitting: Internal Medicine

## 2011-05-14 DIAGNOSIS — S62101A Fracture of unspecified carpal bone, right wrist, initial encounter for closed fracture: Secondary | ICD-10-CM

## 2011-05-14 DIAGNOSIS — R339 Retention of urine, unspecified: Secondary | ICD-10-CM | POA: Diagnosis not present

## 2011-05-14 DIAGNOSIS — G20 Parkinson's disease: Secondary | ICD-10-CM | POA: Diagnosis present

## 2011-05-14 DIAGNOSIS — W010XXA Fall on same level from slipping, tripping and stumbling without subsequent striking against object, initial encounter: Secondary | ICD-10-CM | POA: Diagnosis present

## 2011-05-14 DIAGNOSIS — R296 Repeated falls: Secondary | ICD-10-CM

## 2011-05-14 DIAGNOSIS — W19XXXA Unspecified fall, initial encounter: Secondary | ICD-10-CM

## 2011-05-14 DIAGNOSIS — D72829 Elevated white blood cell count, unspecified: Secondary | ICD-10-CM | POA: Diagnosis present

## 2011-05-14 DIAGNOSIS — E119 Type 2 diabetes mellitus without complications: Secondary | ICD-10-CM | POA: Diagnosis present

## 2011-05-14 DIAGNOSIS — G20A1 Parkinson's disease without dyskinesia, without mention of fluctuations: Secondary | ICD-10-CM | POA: Diagnosis present

## 2011-05-14 DIAGNOSIS — I252 Old myocardial infarction: Secondary | ICD-10-CM

## 2011-05-14 DIAGNOSIS — Y9210 Unspecified residential institution as the place of occurrence of the external cause: Secondary | ICD-10-CM | POA: Diagnosis present

## 2011-05-14 DIAGNOSIS — S52509A Unspecified fracture of the lower end of unspecified radius, initial encounter for closed fracture: Principal | ICD-10-CM | POA: Diagnosis present

## 2011-05-14 DIAGNOSIS — N390 Urinary tract infection, site not specified: Secondary | ICD-10-CM | POA: Diagnosis present

## 2011-05-14 DIAGNOSIS — I4891 Unspecified atrial fibrillation: Secondary | ICD-10-CM | POA: Diagnosis present

## 2011-05-14 DIAGNOSIS — Z951 Presence of aortocoronary bypass graft: Secondary | ICD-10-CM

## 2011-05-14 DIAGNOSIS — I2510 Atherosclerotic heart disease of native coronary artery without angina pectoris: Secondary | ICD-10-CM | POA: Diagnosis present

## 2011-05-14 HISTORY — DX: Acute myocardial infarction, unspecified: I21.9

## 2011-05-14 HISTORY — DX: Hyperlipidemia, unspecified: E78.5

## 2011-05-14 HISTORY — DX: Atherosclerotic heart disease of native coronary artery without angina pectoris: I25.10

## 2011-05-14 HISTORY — DX: Occlusion and stenosis of unspecified carotid artery: I65.29

## 2011-05-14 HISTORY — DX: Nonrheumatic mitral (valve) prolapse: I34.1

## 2011-05-14 HISTORY — DX: Anxiety disorder, unspecified: F41.9

## 2011-05-14 HISTORY — DX: Anesthesia of skin: R20.0

## 2011-05-14 HISTORY — DX: Unspecified dementia, unspecified severity, without behavioral disturbance, psychotic disturbance, mood disturbance, and anxiety: F03.90

## 2011-05-14 HISTORY — DX: Fracture of unspecified carpal bone, right wrist, initial encounter for closed fracture: S62.101A

## 2011-05-14 HISTORY — DX: Polyneuropathy, unspecified: G62.9

## 2011-05-14 LAB — URINALYSIS, ROUTINE W REFLEX MICROSCOPIC
Bilirubin Urine: NEGATIVE
Hgb urine dipstick: NEGATIVE
Ketones, ur: NEGATIVE mg/dL
Protein, ur: NEGATIVE mg/dL
Specific Gravity, Urine: 1.01 (ref 1.005–1.030)
Urobilinogen, UA: 0.2 mg/dL (ref 0.0–1.0)

## 2011-05-14 LAB — GLUCOSE, CAPILLARY: Glucose-Capillary: 94 mg/dL (ref 70–99)

## 2011-05-14 LAB — BASIC METABOLIC PANEL
BUN: 30 mg/dL — ABNORMAL HIGH (ref 6–23)
CO2: 31 mEq/L (ref 19–32)
Calcium: 9.3 mg/dL (ref 8.4–10.5)
Chloride: 100 mEq/L (ref 96–112)
Creatinine, Ser: 1 mg/dL (ref 0.50–1.10)

## 2011-05-14 LAB — CBC
HCT: 34.2 % — ABNORMAL LOW (ref 36.0–46.0)
MCH: 28.9 pg (ref 26.0–34.0)
MCHC: 33.3 g/dL (ref 30.0–36.0)
MCV: 86.8 fL (ref 78.0–100.0)
RDW: 15.4 % (ref 11.5–15.5)

## 2011-05-14 LAB — URINE MICROSCOPIC-ADD ON

## 2011-05-14 MED ORDER — INSULIN ASPART 100 UNIT/ML ~~LOC~~ SOLN
0.0000 [IU] | Freq: Three times a day (TID) | SUBCUTANEOUS | Status: DC
Start: 2011-05-15 — End: 2011-05-18
  Administered 2011-05-15: 2 [IU] via SUBCUTANEOUS
  Administered 2011-05-15: 1 [IU] via SUBCUTANEOUS
  Administered 2011-05-16: 2 [IU] via SUBCUTANEOUS
  Administered 2011-05-17: 1 [IU] via SUBCUTANEOUS
  Administered 2011-05-17 (×2): 5 [IU] via SUBCUTANEOUS
  Administered 2011-05-18: 3 [IU] via SUBCUTANEOUS
  Filled 2011-05-14 (×7): qty 3

## 2011-05-14 MED ORDER — ONDANSETRON HCL 4 MG PO TABS: 4.0000 mg | ORAL_TABLET | Freq: Four times a day (QID) | ORAL | Status: DC | PRN

## 2011-05-14 MED ORDER — HYDROCODONE-ACETAMINOPHEN 5-325 MG PO TABS
1.0000 | ORAL_TABLET | ORAL | Status: DC | PRN
  Administered 2011-05-16 – 2011-05-18 (×3): 2 via ORAL
  Filled 2011-05-14 (×4): qty 2

## 2011-05-14 MED ORDER — MORPHINE SULFATE 2 MG/ML IJ SOLN
INTRAMUSCULAR | Status: AC
  Administered 2011-05-14: 2 mg via INTRAVENOUS
  Filled 2011-05-14: qty 1

## 2011-05-14 MED ORDER — MORPHINE SULFATE 2 MG/ML IJ SOLN
2.0000 mg | Freq: Once | INTRAMUSCULAR | Status: AC
  Administered 2011-05-14: 2 mg via INTRAVENOUS
  Filled 2011-05-14: qty 1

## 2011-05-14 MED ORDER — BOOST PO LIQD
237.0000 mL | Freq: Two times a day (BID) | ORAL | Status: DC
  Administered 2011-05-15 – 2011-05-18 (×4): 237 mL via ORAL
  Filled 2011-05-14 (×10): qty 237

## 2011-05-14 MED ORDER — CARBIDOPA-LEVODOPA 25-100 MG PO TABS
1.0000 | ORAL_TABLET | Freq: Four times a day (QID) | ORAL | Status: DC
  Administered 2011-05-15 – 2011-05-18 (×10): 1 via ORAL
  Filled 2011-05-14 (×18): qty 1

## 2011-05-14 MED ORDER — DEXTROSE 5 % IV SOLN: 1.0000 g | Freq: Once | INTRAVENOUS | Status: DC

## 2011-05-14 MED ORDER — FENOFIBRATE 160 MG PO TABS
160.0000 mg | ORAL_TABLET | Freq: Every day | ORAL | Status: DC
  Administered 2011-05-15 – 2011-05-18 (×2): 160 mg via ORAL
  Filled 2011-05-14 (×4): qty 1

## 2011-05-14 MED ORDER — LIDOCAINE HCL 2 % IJ SOLN
20.0000 mL | Freq: Once | INTRAMUSCULAR | Status: AC
  Administered 2011-05-14: 20 mg

## 2011-05-14 MED ORDER — NITROGLYCERIN 0.4 MG SL SUBL: 0.4000 mg | SUBLINGUAL_TABLET | SUBLINGUAL | Status: DC | PRN

## 2011-05-14 MED ORDER — SODIUM CHLORIDE 0.9 % IJ SOLN
3.0000 mL | Freq: Two times a day (BID) | INTRAMUSCULAR | Status: DC
  Administered 2011-05-15: 3 mL via INTRAVENOUS

## 2011-05-14 MED ORDER — MORPHINE SULFATE 2 MG/ML IJ SOLN
2.0000 mg | Freq: Once | INTRAMUSCULAR | Status: AC
  Administered 2011-05-14: 2 mg via INTRAVENOUS

## 2011-05-14 MED ORDER — POLYETHYLENE GLYCOL 3350 17 G PO PACK
17.0000 g | PACK | Freq: Every day | ORAL | Status: DC
  Administered 2011-05-15 – 2011-05-18 (×3): 17 g via ORAL
  Filled 2011-05-14 (×5): qty 1

## 2011-05-14 MED ORDER — MAGNESIUM OXIDE 400 MG PO TABS
400.0000 mg | ORAL_TABLET | Freq: Every day | ORAL | Status: DC
  Administered 2011-05-15 – 2011-05-18 (×3): 400 mg via ORAL
  Filled 2011-05-14 (×4): qty 1

## 2011-05-14 MED ORDER — FUROSEMIDE 80 MG PO TABS
80.0000 mg | ORAL_TABLET | Freq: Every day | ORAL | Status: DC
  Administered 2011-05-15 – 2011-05-18 (×3): 80 mg via ORAL
  Filled 2011-05-14 (×4): qty 1

## 2011-05-14 MED ORDER — SODIUM CHLORIDE 0.9 % IJ SOLN: 3.0000 mL | INTRAMUSCULAR | Status: DC | PRN

## 2011-05-14 MED ORDER — VITAMIN D3 25 MCG (1000 UNIT) PO TABS
2000.0000 [IU] | ORAL_TABLET | Freq: Every day | ORAL | Status: DC
  Administered 2011-05-15 – 2011-05-18 (×3): 2000 [IU] via ORAL
  Filled 2011-05-14 (×4): qty 2

## 2011-05-14 MED ORDER — LUTEIN 20 MG PO TABS: 1.0000 | ORAL_TABLET | Freq: Every day | ORAL | Status: DC

## 2011-05-14 MED ORDER — POTASSIUM CHLORIDE CRYS ER 20 MEQ PO TBCR
20.0000 meq | EXTENDED_RELEASE_TABLET | Freq: Every day | ORAL | Status: DC
  Administered 2011-05-15: 20 meq via ORAL
  Filled 2011-05-14 (×4): qty 1

## 2011-05-14 MED ORDER — ONDANSETRON HCL 4 MG/2ML IJ SOLN: 4.0000 mg | Freq: Four times a day (QID) | INTRAMUSCULAR | Status: DC | PRN

## 2011-05-14 MED ORDER — ACETAMINOPHEN 650 MG RE SUPP: 650.0000 mg | Freq: Four times a day (QID) | RECTAL | Status: DC | PRN

## 2011-05-14 MED ORDER — CLOPIDOGREL BISULFATE 75 MG PO TABS
75.0000 mg | ORAL_TABLET | Freq: Every day | ORAL | Status: DC
  Administered 2011-05-15 – 2011-05-18 (×3): 75 mg via ORAL
  Filled 2011-05-14 (×4): qty 1

## 2011-05-14 MED ORDER — ENOXAPARIN SODIUM 40 MG/0.4ML ~~LOC~~ SOLN
40.0000 mg | Freq: Every day | SUBCUTANEOUS | Status: DC
Start: 2011-05-15 — End: 2011-05-16
  Filled 2011-05-14 (×4): qty 0.4

## 2011-05-14 MED ORDER — SODIUM CHLORIDE 0.9 % IV SOLN
3.0000 g | Freq: Once | INTRAVENOUS | Status: AC
  Administered 2011-05-14: 3 g via INTRAVENOUS
  Filled 2011-05-14: qty 3

## 2011-05-14 MED ORDER — ACETAMINOPHEN 325 MG PO TABS: 650.0000 mg | ORAL_TABLET | Freq: Four times a day (QID) | ORAL | Status: DC | PRN

## 2011-05-14 MED ORDER — MORPHINE SULFATE 2 MG/ML IJ SOLN
2.0000 mg | INTRAMUSCULAR | Status: DC | PRN
  Administered 2011-05-14 – 2011-05-18 (×10): 2 mg via INTRAVENOUS
  Filled 2011-05-14 (×12): qty 1

## 2011-05-14 MED ORDER — ALPRAZOLAM 0.25 MG PO TABS
0.2500 mg | ORAL_TABLET | Freq: Every day | ORAL | Status: DC
  Administered 2011-05-15 – 2011-05-17 (×3): 0.25 mg via ORAL
  Filled 2011-05-14 (×3): qty 1

## 2011-05-14 MED ORDER — FLAX SEED OIL 1000 MG PO CAPS: 1.0000 | ORAL_CAPSULE | Freq: Every day | ORAL | Status: DC

## 2011-05-14 MED ORDER — SODIUM CHLORIDE 0.9 % IV SOLN
1.5000 g | Freq: Four times a day (QID) | INTRAVENOUS | Status: DC
  Administered 2011-05-15 (×2): 1.5 g via INTRAVENOUS
  Administered 2011-05-15: 06:00:00 via INTRAVENOUS
  Administered 2011-05-16 – 2011-05-17 (×6): 1.5 g via INTRAVENOUS
  Filled 2011-05-14 (×15): qty 1.5

## 2011-05-14 MED ORDER — SODIUM CHLORIDE 0.9 % IV SOLN
250.0000 mL | INTRAVENOUS | Status: DC | PRN
  Administered 2011-05-15 (×2): 250 mL via INTRAVENOUS

## 2011-05-14 MED ORDER — LIDOCAINE HCL 2 % IJ SOLN
INTRAMUSCULAR | Status: AC
  Administered 2011-05-14: 20 mg
  Filled 2011-05-14: qty 1

## 2011-05-14 MED ORDER — OMEGA-3 FATTY ACIDS 1000 MG PO CAPS: 1.0000 g | ORAL_CAPSULE | Freq: Every day | ORAL | Status: DC

## 2011-05-14 MED ORDER — DIGOXIN 125 MCG PO TABS
125.0000 ug | ORAL_TABLET | Freq: Every day | ORAL | Status: DC
  Administered 2011-05-15 – 2011-05-18 (×3): 125 ug via ORAL
  Filled 2011-05-14 (×4): qty 1

## 2011-05-14 MED ORDER — BUSPIRONE HCL 10 MG PO TABS
10.0000 mg | ORAL_TABLET | Freq: Three times a day (TID) | ORAL | Status: DC
  Administered 2011-05-15 – 2011-05-18 (×6): 10 mg via ORAL
  Filled 2011-05-14 (×12): qty 1

## 2011-05-14 MED ORDER — DOCUSATE SODIUM 100 MG PO CAPS
100.0000 mg | ORAL_CAPSULE | Freq: Two times a day (BID) | ORAL | Status: DC
  Administered 2011-05-15 – 2011-05-18 (×4): 100 mg via ORAL
  Filled 2011-05-14 (×11): qty 1

## 2011-05-14 MED ORDER — QUETIAPINE FUMARATE 25 MG PO TABS
25.0000 mg | ORAL_TABLET | Freq: Every day | ORAL | Status: DC
  Administered 2011-05-16 – 2011-05-17 (×2): 25 mg via ORAL
  Filled 2011-05-14 (×4): qty 1

## 2011-05-14 NOTE — ED Notes (Signed)
ZOX:[id number]<AV> Expected date:05/14/11<BR> Expected time:<BR> Means of arrival:<BR> Comments:<BR> ems pt fall

## 2011-05-14 NOTE — ED Notes (Signed)
MD at bedside (dr. Caprice Red). As well as Ortho tech has been notified for finger traps and sugar tong.

## 2011-05-14 NOTE — ED Provider Notes (Addendum)
History     CSN: [id number] Arrival date & time: 05/14/2011 10:26 AM   First MD Initiated Contact with Patient 05/14/11 1051      Chief Complaint  Patient presents with  . Fall  . Hand Injury    (Consider location/radiation/quality/duration/timing/severity/associated sxs/prior treatment) HPI Elderly right-handed female, who states she fell while holding a meal tray. She is from the assisted-living care facility and at that the tray and the hallway and was holding onto the doorknob but fell forward, landing onto her right wrist, where there is obvious deformity noted. She recently had a fracture in the same area. She denies any other injuries at this time, but was recently seen in the ER for head injury.     Past Medical History  Diagnosis Date  . Hypertension   . Atrial fibrillation   . Parkinson's disease     No past surgical history on file.  No family history on file.  History  Substance Use Topics  . Smoking status: Never Smoker   . Smokeless tobacco: Not on file  . Alcohol Use: No    OB History    Grav Para Term Preterm Abortions TAB SAB Ect Mult Living                  Review of Systems  All other systems reviewed and are negative.    Allergies  Latex  Home Medications  No current outpatient prescriptions on file.  BP 140/62  Pulse 71  Temp(Src) 97.6 F (36.4 C) (Oral)  Resp 16  SpO2 98%  Physical Exam  Constitutional: She is oriented to person, place, and time. She appears well-developed and well-nourished. No distress.  HENT:  Head: Normocephalic and atraumatic.  Mouth/Throat: Oropharynx is clear and moist.  Eyes: Conjunctivae and EOM are normal. Pupils are equal, round, and reactive to light.  Neck: Neck supple.  Cardiovascular: Normal rate and regular rhythm.  Exam reveals no gallop and no friction rub.   No murmur heard. Pulmonary/Chest: Breath sounds normal. She has no wheezes. She has no rales. She exhibits no tenderness.    Abdominal: Soft. Bowel sounds are normal. She exhibits no distension. There is no tenderness. There is no rebound and no guarding.  Musculoskeletal: Normal range of motion.       Deformity R wrist, pulses intact.   Neurological: She is alert and oriented to person, place, and time. No cranial nerve deficit. Coordination normal.  Skin: Skin is warm and dry. No rash noted.  Psychiatric: She has a normal mood and affect.    ED Course  Procedures (including critical care time)  Labs Reviewed - No data to display No results found.   No diagnosis found.    MDM  Pt is seen and examined;  Initial history and physical completed.  Will follow.          Vickey Ewbank A. Patrica Duel, MD 05/14/11 1102  Results for orders placed during the hospital encounter of 02/12/11  DIFFERENTIAL      Component Value Range   Neutrophils Relative 26 (*) 43 - 77 (%)   Lymphocytes Relative 70 (*) 12 - 46 (%)   Monocytes Relative 3  3 - 12 (%)   Eosinophils Relative 1  0 - 5 (%)   Basophils Relative 0  0 - 1 (%)   Neutro Abs 4.8  1.7 - 7.7 (K/uL)   Lymphs Abs 12.9 (*) 0.7 - 4.0 (K/uL)   Monocytes Absolute 0.6  0.1 - 1.0 (K/uL)  Eosinophils Absolute 0.2  0.0 - 0.7 (K/uL)   Basophils Absolute 0.0  0.0 - 0.1 (K/uL)   WBC Morphology ABSOLUTE LYMPHOCYTOSIS     Smear Review PENDING PATHOLOGIST REVIEW    CBC      Component Value Range   WBC 18.5 (*) 4.0 - 10.5 (K/uL)   RBC 3.86 (*) 3.87 - 5.11 (MIL/uL)   Hemoglobin 10.9 (*) 12.0 - 15.0 (g/dL)   HCT 16.1 (*) 09.6 - 46.0 (%)   MCV 87.0  78.0 - 100.0 (fL)   MCH 28.2  26.0 - 34.0 (pg)   MCHC 32.4  30.0 - 36.0 (g/dL)   RDW 04.5  40.9 - 81.1 (%)   Platelets 308  150 - 400 (K/uL)  BASIC METABOLIC PANEL      Component Value Range   Sodium 137  135 - 145 (mEq/L)   Potassium 3.9  3.5 - 5.1 (mEq/L)   Chloride 100  96 - 112 (mEq/L)   CO2 32  19 - 32 (mEq/L)   Glucose, Bld 225 (*) 70 - 99 (mg/dL)   BUN 29 (*) 6 - 23 (mg/dL)   Creatinine, Ser 9.14  0.50 - 1.10  (mg/dL)   Calcium 78.2  8.4 - 10.5 (mg/dL)   GFR calc non Af Amer 55 (*) >60 (mL/min)   GFR calc Af Amer >60  >60 (mL/min)  URINALYSIS, ROUTINE W REFLEX MICROSCOPIC      Component Value Range   Color, Urine YELLOW  YELLOW    Appearance TURBID (*) CLEAR    Specific Gravity, Urine 1.013  1.005 - 1.030    pH 8.0  5.0 - 8.0    Glucose, UA 100 (*) NEGATIVE (mg/dL)   Hgb urine dipstick NEGATIVE  NEGATIVE    Bilirubin Urine NEGATIVE  NEGATIVE    Ketones, ur NEGATIVE  NEGATIVE (mg/dL)   Protein, ur 30 (*) NEGATIVE (mg/dL)   Urobilinogen, UA 1.0  0.0 - 1.0 (mg/dL)   Nitrite NEGATIVE  NEGATIVE    Leukocytes, UA SMALL (*) NEGATIVE   URINE MICROSCOPIC-ADD ON      Component Value Range   Squamous Epithelial / LPF RARE  RARE    WBC, UA 3-6  <3 (WBC/hpf)   Bacteria, UA MANY (*) RARE    Crystals TRIPLE PHOSPHATE CRYSTALS (*) NEGATIVE    Urine-Other AMORPHOUS URATES/PHOSPHATES    GLUCOSE, CAPILLARY      Component Value Range   Glucose-Capillary 205 (*) 70 - 99 (mg/dL)   Comment 1 Notify RN    TSH      Component Value Range   TSH 1.275  0.350 - 4.500 (uIU/mL)  VITAMIN B12      Component Value Range   Vitamin B-12 698  211 - 911 (pg/mL)  GLUCOSE, CAPILLARY      Component Value Range   Glucose-Capillary 115 (*) 70 - 99 (mg/dL)   Comment 1 Notify RN    RPR      Component Value Range   RPR NON REACTIVE  NON REACTIVE   GLUCOSE, CAPILLARY      Component Value Range   Glucose-Capillary 183 (*) 70 - 99 (mg/dL)  CBC      Component Value Range   WBC 18.1 (*) 4.0 - 10.5 (K/uL)   RBC 3.70 (*) 3.87 - 5.11 (MIL/uL)   Hemoglobin 10.3 (*) 12.0 - 15.0 (g/dL)   HCT 95.6 (*) 21.3 - 46.0 (%)   MCV 87.3  78.0 - 100.0 (fL)   MCH 27.8  26.0 -  34.0 (pg)   MCHC 31.9  30.0 - 36.0 (g/dL)   RDW 54.0  98.1 - 19.1 (%)   Platelets 279  150 - 400 (K/uL)  BASIC METABOLIC PANEL      Component Value Range   Sodium 140  135 - 145 (mEq/L)   Potassium 3.5  3.5 - 5.1 (mEq/L)   Chloride 102  96 - 112 (mEq/L)    CO2 30  19 - 32 (mEq/L)   Glucose, Bld 134 (*) 70 - 99 (mg/dL)   BUN 31 (*) 6 - 23 (mg/dL)   Creatinine, Ser 4.78  0.50 - 1.10 (mg/dL)   Calcium 9.8  8.4 - 29.5 (mg/dL)   GFR calc non Af Amer 52 (*) >60 (mL/min)   GFR calc Af Amer >60  >60 (mL/min)  GLUCOSE, CAPILLARY      Component Value Range   Glucose-Capillary 78  70 - 99 (mg/dL)  PATHOLOGIST SMEAR REVIEW      Component Value Range   Tech Review Reviewed by Beulah Gandy. Luisa Hart, M.D.    FOLATE RBC      Component Value Range   RBC Folate 582  >=366 (ng/mL)  GLUCOSE, CAPILLARY      Component Value Range   Glucose-Capillary 194 (*) 70 - 99 (mg/dL)  GLUCOSE, CAPILLARY      Component Value Range   Glucose-Capillary 125 (*) 70 - 99 (mg/dL)   Comment 1 Documented in Chart     Comment 2 Notify RN    URINE CULTURE      Component Value Range   Specimen Description URINE, CLEAN CATCH     Special Requests IMMUNE:NORM UT SYMPT:NEG     Setup Time [id number]     Colony Count >=100,000 COLONIES/ML     Culture ENTEROCOCCUS SPECIES     Report Status 02/16/2011 FINAL     Organism ID, Bacteria ENTEROCOCCUS SPECIES    GLUCOSE, CAPILLARY      Component Value Range   Glucose-Capillary 122 (*) 70 - 99 (mg/dL)  GLUCOSE, CAPILLARY      Component Value Range   Glucose-Capillary 84  70 - 99 (mg/dL)   Comment 1 Notify RN    GLUCOSE, CAPILLARY      Component Value Range   Glucose-Capillary 155 (*) 70 - 99 (mg/dL)   Comment 1 Notify RN    GLUCOSE, CAPILLARY      Component Value Range   Glucose-Capillary 140 (*) 70 - 99 (mg/dL)   Comment 1 Notify RN    GLUCOSE, CAPILLARY      Component Value Range   Glucose-Capillary 124 (*) 70 - 99 (mg/dL)  GLUCOSE, CAPILLARY      Component Value Range   Glucose-Capillary 78  70 - 99 (mg/dL)   Comment 1 Notify RN    GLUCOSE, CAPILLARY      Component Value Range   Glucose-Capillary 171 (*) 70 - 99 (mg/dL)   Comment 1 Notify RN     Dg Wrist Complete Right  05/14/2011  *RADIOLOGY REPORT*  Clinical Data:  Deformity, fall  RIGHT WRIST - COMPLETE 3+ VIEW  Comparison: 02/03/2011  Findings: Bones appear osteopenic.  Acute displaced fractures of the right distal radius and ulna.  There is associated subluxation at the wrist.  Carpal bones and metacarpals appear intact.  Diffuse soft tissue swelling.  IMPRESSION: Acute displaced fractures of the right distal radius and ulna with associated subluxation.  Per CMS PQRS reporting requirements (PQRS Measure 24): Given the patient's age of greater than 35  and the fracture site (hip, distal radius, or spine), the patient should be tested for osteoporosis using DXA, and the appropriate treatment considered based on the DXA results.  Original Report Authenticated By: Judie Petit. Ruel Favors, M.D.      12:45 PM Xrays or radiologic studies  reviewed by myself, interpreted by Radiologist.  Will page Dr Mina Marble, pt's own ortho doc.   Tammye Kahler A. Patrica Duel, MD 05/14/11 1246  1:00 PM Discussed with Larita Fife, Dr. Ronie Spies nurse. She will relay the information to Dr. Mina Marble, and call back.  Jamez Ambrocio A. Patrica Duel, MD 05/14/11 1300  4:10 PM Patient will be signed out to the oncoming physician pending the laboratory studies and try at admission for placement for higher level of care. Also, will likely be needing surgery on her right wrist on Wednesday by Dr. Mina Marble.  Katielynn Horan A. Patrica Duel, MD 05/14/11 1610

## 2011-05-14 NOTE — Consult Note (Signed)
  See dictated note [id number]

## 2011-05-14 NOTE — Consult Note (Signed)
NAMEBIBI, ECONOMOS NO.:  [id number]  MEDICAL RECORD NO.:  [id number]  LOCATION:  WA13                         FACILITY:  [facility]  PHYSICIAN:  Artist Pais. Dorice Stiggers, M.D.DATE OF BIRTH:  Nov 15, 1932  DATE OF CONSULTATION:  05/14/2011 DATE OF DISCHARGE:                                CONSULTATION   REQUESTING PHYSICIAN:  __________.  REASON FOR CONSULTATION:  [name] is a 77 year old female, right-hand dominant, old patient of mine, who is status post closed treatment of right distal radius and ulnar shaft fracture, today was noticed to have displaced right distal radius and ulnar fractures.  ALLERGIES:  She has a skin allergy to LATEX.  PAST MEDICAL HISTORY:  Significant for hypertension, atrial fibrillation, Parkinson disease.  She is on multiple medications, listed and documented in the chart.  PHYSICAL EXAMINATION:  Reveals obvious deformity of his right upper extremity.  Neurosensory exam is intact.  X-rays show a displaced radius fracture of distal radius as well as a distal ulnar fracture.  I discussed with both the patient and her daughter.  Recommend closed reduction today and then open reduction internal fixation of both fractures.  We would, because of her Parkinson disease, consider admission to Medicine and medical tune up,  then surgery probably Wednesday here at [facility] or if she is discharged, outpatient either at [facility]  or the surgical center.  The closed reduction will be performed with a sugar-tong splint.  Again she will be discharged to home to follow up in my office this week or admission to Medicine for continued observation and workup for her falls as well.     Artist Pais Mina Marble, M.D.     MAW/MEDQ  D:  05/14/2011  T:  05/14/2011  Job:  [id number]

## 2011-05-14 NOTE — Consult Note (Signed)
  Old patient of mine   Will reduce right wrist and plan orif this week  Full note dictated

## 2011-05-14 NOTE — ED Notes (Signed)
MD at bedside. 

## 2011-05-14 NOTE — ED Notes (Signed)
Patient transported to X-ray 

## 2011-05-14 NOTE — H&P (Signed)
PCP:   Nadean Corwin, MD, MD   Chief Complaint:  April Baker, broke her right wrist, admitted for rehabilitation placement   HPI: April Baker is an 77 y.o. female with history of Parkinson's disease, frequent fall, hypertension, atrial fibrillation not on anticoagulation, adult-onset diabetes on Januvia, known coronary disease status post prior sternotomy, fell yesterday and hit her head without significant injury, had another mechanical fall today and broke her right wrist.  She was seen in consultation with orthopedics and her right wrist was placed on a cast.  She is a resident of assisted living and cannot go back to her place because she is unable to care for herself. For this reason, hospitalist was asked to admit patient for rehabilitation placement.  Workup in the emergency room also showed that she has leukocytosis with a white count 23,000, and a urinalysis consistent with a persistent infection.  She had been on Keflex for prior UTI.  Patient herself is confused and although she is able to tell where she lives, and who she has, she did not recall the event of this morning.  Rewiew of Systems:  The patient denies anorexia, fever, weight loss,, vision loss, decreased hearing, hoarseness, chest pain, syncope, dyspnea on exertion, peripheral edema, balance deficits, hemoptysis, abdominal pain, melena, hematochezia, severe indigestion/heartburn, hematuria, incontinence, genital sores, muscle weakness, suspicious skin lesions, transient blindness, difficulty walking, depression, unusual weight change, abnormal bleeding, enlarged lymph nodes, angioedema, and breast masses.    Past Medical History  Diagnosis Date  . Hypertension   . Atrial fibrillation   . Parkinson's disease     No past surgical history on file.  Medications:  HOME MEDS: Prior to Admission medications   Medication Sig Start Date End Date Taking? Authorizing Provider  ALPRAZolam (XANAX) 0.25 MG tablet Take 0.25 mg  by mouth at bedtime.     Yes Historical Provider, MD  Boost (BOOST) LIQD Take 237 mLs by mouth 2 (two) times daily.     Yes Historical Provider, MD  busPIRone (BUSPAR) 10 MG tablet Take 10 mg by mouth 3 (three) times daily.     Yes Historical Provider, MD  carbidopa-levodopa (SINEMET) 25-100 MG per tablet Take 1 tablet by mouth 4 (four) times daily.     Yes Historical Provider, MD  cholecalciferol (VITAMIN D) 1000 UNITS tablet Take 2,000 Units by mouth daily.     Yes Historical Provider, MD  clopidogrel (PLAVIX) 75 MG tablet Take 75 mg by mouth daily.     Yes Historical Provider, MD  digoxin (LANOXIN) 0.125 MG tablet Take 125 mcg by mouth daily.     Yes Historical Provider, MD  docusate sodium (COLACE) 100 MG capsule Take 100 mg by mouth 2 (two) times daily.     Yes Historical Provider, MD  fenofibrate micronized (LOFIBRA) 134 MG capsule Take 134 mg by mouth daily before breakfast.     Yes Historical Provider, MD  fish oil-omega-3 fatty acids 1000 MG capsule Take 1 g by mouth daily.     Yes Historical Provider, MD  Flaxseed, Linseed, (FLAX SEED OIL) 1000 MG CAPS Take 1 capsule by mouth daily.     Yes Historical Provider, MD  furosemide (LASIX) 80 MG tablet Take 80 mg by mouth daily.     Yes Historical Provider, MD  Lutein 20 MG TABS Take 1 tablet by mouth daily.     Yes Historical Provider, MD  magnesium oxide (MAG-OX) 400 MG tablet Take 400 mg by mouth daily.  Yes Historical Provider, MD  nitroGLYCERIN (NITROSTAT) 0.4 MG SL tablet Place 0.4 mg under the tongue every 5 (five) minutes as needed. Chest pain    Yes Historical Provider, MD  polyethylene glycol (MIRALAX / GLYCOLAX) packet Take 17 g by mouth daily.     Yes Historical Provider, MD  potassium chloride SA (K-DUR,KLOR-CON) 20 MEQ tablet Take 20 mEq by mouth daily.     Yes Historical Provider, MD  QUEtiapine (SEROQUEL) 25 MG tablet Take 25 mg by mouth at bedtime.     Yes Historical Provider, MD  sitaGLIPtin (JANUVIA) 50 MG tablet Take 50  mg by mouth daily.     Yes Historical Provider, MD  traMADol (ULTRAM) 50 MG tablet Take 50 mg by mouth every 6 (six) hours as needed. Maximum dose= 8 tablets per day. pain    Yes Historical Provider, MD     Allergies:  Allergies  Allergen Reactions  . Latex Itching    Social History:   reports that she has never smoked. She does not have any smokeless tobacco history on file. She reports that she does not drink alcohol or use illicit drugs.  Family History: No family history on file.   Physical Exam: Filed Vitals:   05/14/11 1310 05/14/11 1555 05/14/11 1833 05/14/11 1957  BP: 152/47 123/73 161/54 131/81  Pulse:   73 97  Temp:   99 F (37.2 C)   TempSrc:   Oral   Resp:    16  SpO2: 99%  100% 97%   Blood pressure 131/81, pulse 97, temperature 99 F (37.2 C), temperature source Oral, resp. rate 16, SpO2 97.00%.  GEN:  Pleasant person lying in the stretcher in no acute distress; cooperative with exam PSYCH:  alert and oriented x4; does not appear anxious does not appear depressed; affect is normal HEENT: Mucous membranes pink and anicteric; PERRLA; EOM intact; no cervical lymphadenopathy nor thyromegaly or carotid bruit; no JVD; Breasts:: Not examined CHEST WALL: No tenderness CHEST: Normal respiration, clear to auscultation bilaterally. She has a well-healed scar consistent with prior sternotomy HEART: Regular rate and rhythm; no murmurs rubs or gallops BACK: No kyphosis or scoliosis; no CVA tenderness ABDOMEN: Obese, soft non-tender; no masses, no organomegaly, normal abdominal bowel sounds; no pannus; no intertriginous candida. Rectal Exam: Not done EXTREMITIES: No bone or joint deformity; age-appropriate arthropathy of the hands and knees; no edema; no ulcerations. Genitalia: not examined PULSES: 2+ and symmetric SKIN: Normal hydration no rash or ulceration CNS: Cranial nerves 2-12 grossly intact no focal neurologic deficit. She has no cogwheel rigidity. Her right wrist  and forearm is in a cast.  She has no no tremor   Labs & Imaging Results for orders placed during the hospital encounter of 05/14/11 (from the past 48 hour(s))  BASIC METABOLIC PANEL     Status: Abnormal   Collection Time   05/14/11  3:52 PM      Component Value Range Comment   Sodium 139  135 - 145 (mEq/L)    Potassium 3.4 (*) 3.5 - 5.1 (mEq/L)    Chloride 100  96 - 112 (mEq/L)    CO2 31  19 - 32 (mEq/L)    Glucose, Bld 111 (*) 70 - 99 (mg/dL)    BUN 30 (*) 6 - 23 (mg/dL)    Creatinine, Ser 1.32  0.50 - 1.10 (mg/dL)    Calcium 9.3  8.4 - 10.5 (mg/dL)    GFR calc non Af Amer 53 (*) >90 (mL/min)  GFR calc Af Amer 62 (*) >90 (mL/min)   CBC     Status: Abnormal   Collection Time   05/14/11  3:52 PM      Component Value Range Comment   WBC 23.9 (*) 4.0 - 10.5 (K/uL)    RBC 3.94  3.87 - 5.11 (MIL/uL)    Hemoglobin 11.4 (*) 12.0 - 15.0 (g/dL)    HCT 16.1 (*) 09.6 - 46.0 (%)    MCV 86.8  78.0 - 100.0 (fL)    MCH 28.9  26.0 - 34.0 (pg)    MCHC 33.3  30.0 - 36.0 (g/dL)    RDW 04.5  40.9 - 81.1 (%)    Platelets 266  150 - 400 (K/uL)   GLUCOSE, CAPILLARY     Status: Normal   Collection Time   05/14/11  5:50 PM      Component Value Range Comment   Glucose-Capillary 94  70 - 99 (mg/dL)    Comment 1 Documented in Chart      Comment 2 Notify RN     URINALYSIS, ROUTINE W REFLEX MICROSCOPIC     Status: Abnormal   Collection Time   05/14/11  6:06 PM      Component Value Range Comment   Color, Urine YELLOW  YELLOW     Appearance TURBID (*) CLEAR     Specific Gravity, Urine 1.010  1.005 - 1.030     pH 7.0  5.0 - 8.0     Glucose, UA NEGATIVE  NEGATIVE (mg/dL)    Hgb urine dipstick NEGATIVE  NEGATIVE     Bilirubin Urine NEGATIVE  NEGATIVE     Ketones, ur NEGATIVE  NEGATIVE (mg/dL)    Protein, ur NEGATIVE  NEGATIVE (mg/dL)    Urobilinogen, UA 0.2  0.0 - 1.0 (mg/dL)    Nitrite NEGATIVE  NEGATIVE     Leukocytes, UA LARGE (*) NEGATIVE    URINE MICROSCOPIC-ADD ON     Status: Abnormal    Collection Time   05/14/11  6:06 PM      Component Value Range Comment   Squamous Epithelial / LPF RARE  RARE     WBC, UA 11-20  <3 (WBC/hpf)    Crystals CA OXALATE CRYSTALS (*) NEGATIVE     Dg Chest 2 View  05/14/2011  *RADIOLOGY REPORT*  Clinical Data: Wrist fracture, preoperative evaluation  CHEST - 2 VIEW  Comparison: 02/13/2011  Findings: Several skin folds overlie the left chest.  Stable heart size and previous median sternotomy.  Negative for CHF or pneumonia.  No large effusion.  Atherosclerosis of the aortic branch vessels.  Trachea is midline. Coronary stents evident.  IMPRESSION: Postoperative findings.  No acute chest process.  Original Report Authenticated By: Judie Petit. Ruel Favors, M.D.   Dg Wrist Complete Right  05/14/2011  *RADIOLOGY REPORT*  Clinical Data: Deformity, fall  RIGHT WRIST - COMPLETE 3+ VIEW  Comparison: 02/03/2011  Findings: Bones appear osteopenic.  Acute displaced fractures of the right distal radius and ulna.  There is associated subluxation at the wrist.  Carpal bones and metacarpals appear intact.  Diffuse soft tissue swelling.  IMPRESSION: Acute displaced fractures of the right distal radius and ulna with associated subluxation.  Per CMS PQRS reporting requirements (PQRS Measure 24): Given the patient's age of greater than 50 and the fracture site (hip, distal radius, or spine), the patient should be tested for osteoporosis using DXA, and the appropriate treatment considered based on the DXA results.  Original Report Authenticated By: Judie Petit.  Ruel Favors, M.D.      Assessment Present on Admission:  .Fracture of right wrist .Fall .Parkinson's disease .DM (diabetes mellitus) .UTI (urinary tract infection)   PLAN:  Per problem list are as above. She will be admitted for rehabilitation placement.  I will give her pain medication for her right wrist fracture.  Since she was on Keflex for UTI with persistent infection,  it is suspicious for enterococcus, and thus we  will use Unasyn IV. Urine will be cultured. With respect to her atrial fibrillation, since she has been on digitalis, will put her on telemetry and get a dig level.  For her diabetes I will stop the Januvia and put her on insulin sliding scale for now. She is otherwise stable and her medications have been continued.  We respect to her Parkinson's disease, she is on Sinemet without any rigidity, but she will be still be at risk for falling.   Other plans as per orders.    Haygen Zebrowski 05/14/2011, 9:22 PM

## 2011-05-14 NOTE — ED Notes (Signed)
Pt fell while holding meal tray and has a deformity to rt wrist and 2 weeks ago had a cast removed from the same wrist area. 20g iv and was given 100 fentalyl iv,

## 2011-05-14 NOTE — ED Provider Notes (Addendum)
Date: 05/14/2011  Rate: 65  Rhythm: indeterminate  QRS Axis: normal  Intervals: normal  ST/T Wave abnormalities: nonspecific ST/T changes  Conduction Disutrbances:nonspecific intraventricular conduction delay  Narrative Interpretation:   Old EKG Reviewed: changes noted Changes noted but mostly due to artifact. No real significant change in EKG from 31st of August 2012.  Results for orders placed during the hospital encounter of 05/14/11  BASIC METABOLIC PANEL      Component Value Range   Sodium 139  135 - 145 (mEq/L)   Potassium 3.4 (*) 3.5 - 5.1 (mEq/L)   Chloride 100  96 - 112 (mEq/L)   CO2 31  19 - 32 (mEq/L)   Glucose, Bld 111 (*) 70 - 99 (mg/dL)   BUN 30 (*) 6 - 23 (mg/dL)   Creatinine, Ser 1.61  0.50 - 1.10 (mg/dL)   Calcium 9.3  8.4 - 09.6 (mg/dL)   GFR calc non Af Amer 53 (*) >90 (mL/min)   GFR calc Af Amer 62 (*) >90 (mL/min)  CBC      Component Value Range   WBC 23.9 (*) 4.0 - 10.5 (K/uL)   RBC 3.94  3.87 - 5.11 (MIL/uL)   Hemoglobin 11.4 (*) 12.0 - 15.0 (g/dL)   HCT 04.5 (*) 40.9 - 46.0 (%)   MCV 86.8  78.0 - 100.0 (fL)   MCH 28.9  26.0 - 34.0 (pg)   MCHC 33.3  30.0 - 36.0 (g/dL)   RDW 81.1  91.4 - 78.2 (%)   Platelets 266  150 - 400 (K/uL)  URINALYSIS, ROUTINE W REFLEX MICROSCOPIC      Component Value Range   Color, Urine YELLOW  YELLOW    Appearance TURBID (*) CLEAR    Specific Gravity, Urine 1.010  1.005 - 1.030    pH 7.0  5.0 - 8.0    Glucose, UA NEGATIVE  NEGATIVE (mg/dL)   Hgb urine dipstick NEGATIVE  NEGATIVE    Bilirubin Urine NEGATIVE  NEGATIVE    Ketones, ur NEGATIVE  NEGATIVE (mg/dL)   Protein, ur NEGATIVE  NEGATIVE (mg/dL)   Urobilinogen, UA 0.2  0.0 - 1.0 (mg/dL)   Nitrite NEGATIVE  NEGATIVE    Leukocytes, UA LARGE (*) NEGATIVE   GLUCOSE, CAPILLARY      Component Value Range   Glucose-Capillary 94  70 - 99 (mg/dL)   Comment 1 Documented in Chart     Comment 2 Notify RN    URINE MICROSCOPIC-ADD ON      Component Value Range   Squamous  Epithelial / LPF RARE  RARE    WBC, UA 11-20  <3 (WBC/hpf)   Crystals CA OXALATE CRYSTALS (*) NEGATIVE    Results for orders placed during the hospital encounter of 05/14/11  BASIC METABOLIC PANEL      Component Value Range   Sodium 139  135 - 145 (mEq/L)   Potassium 3.4 (*) 3.5 - 5.1 (mEq/L)   Chloride 100  96 - 112 (mEq/L)   CO2 31  19 - 32 (mEq/L)   Glucose, Bld 111 (*) 70 - 99 (mg/dL)   BUN 30 (*) 6 - 23 (mg/dL)   Creatinine, Ser 9.56  0.50 - 1.10 (mg/dL)   Calcium 9.3  8.4 - 21.3 (mg/dL)   GFR calc non Af Amer 53 (*) >90 (mL/min)   GFR calc Af Amer 62 (*) >90 (mL/min)  CBC      Component Value Range   WBC 23.9 (*) 4.0 - 10.5 (K/uL)   RBC 3.94  3.87 - 5.11 (MIL/uL)   Hemoglobin 11.4 (*) 12.0 - 15.0 (g/dL)   HCT 16.1 (*) 09.6 - 46.0 (%)   MCV 86.8  78.0 - 100.0 (fL)   MCH 28.9  26.0 - 34.0 (pg)   MCHC 33.3  30.0 - 36.0 (g/dL)   RDW 04.5  40.9 - 81.1 (%)   Platelets 266  150 - 400 (K/uL)  URINALYSIS, ROUTINE W REFLEX MICROSCOPIC      Component Value Range   Color, Urine YELLOW  YELLOW    Appearance TURBID (*) CLEAR    Specific Gravity, Urine 1.010  1.005 - 1.030    pH 7.0  5.0 - 8.0    Glucose, UA NEGATIVE  NEGATIVE (mg/dL)   Hgb urine dipstick NEGATIVE  NEGATIVE    Bilirubin Urine NEGATIVE  NEGATIVE    Ketones, ur NEGATIVE  NEGATIVE (mg/dL)   Protein, ur NEGATIVE  NEGATIVE (mg/dL)   Urobilinogen, UA 0.2  0.0 - 1.0 (mg/dL)   Nitrite NEGATIVE  NEGATIVE    Leukocytes, UA LARGE (*) NEGATIVE   GLUCOSE, CAPILLARY      Component Value Range   Glucose-Capillary 94  70 - 99 (mg/dL)   Comment 1 Documented in Chart     Comment 2 Notify RN    URINE MICROSCOPIC-ADD ON      Component Value Range   Squamous Epithelial / LPF RARE  RARE    WBC, UA 11-20  <3 (WBC/hpf)   Crystals CA OXALATE CRYSTALS (*) NEGATIVE    Dg Chest 2 View  05/14/2011  *RADIOLOGY REPORT*  Clinical Data: Wrist fracture, preoperative evaluation  CHEST - 2 VIEW  Comparison: 02/13/2011  Findings: Several  skin folds overlie the left chest.  Stable heart size and previous median sternotomy.  Negative for CHF or pneumonia.  No large effusion.  Atherosclerosis of the aortic branch vessels.  Trachea is midline. Coronary stents evident.  IMPRESSION: Postoperative findings.  No acute chest process.  Original Report Authenticated By: Judie Petit. Ruel Favors, M.D.   Dg Wrist Complete Right  05/14/2011  *RADIOLOGY REPORT*  Clinical Data: Deformity, fall  RIGHT WRIST - COMPLETE 3+ VIEW  Comparison: 02/03/2011  Findings: Bones appear osteopenic.  Acute displaced fractures of the right distal radius and ulna.  There is associated subluxation at the wrist.  Carpal bones and metacarpals appear intact.  Diffuse soft tissue swelling.  IMPRESSION: Acute displaced fractures of the right distal radius and ulna with associated subluxation.  Per CMS PQRS reporting requirements (PQRS Measure 24): Given the patient's age of greater than 50 and the fracture site (hip, distal radius, or spine), the patient should be tested for osteoporosis using DXA, and the appropriate treatment considered based on the DXA results.  Original Report Authenticated By: Judie Petit. Ruel Favors, M.D.    MDM  Review of patient's labs note marked elevation in white blood cell count, urinalysis consistent with urinary tract infection. Review of assisted-living records show that patient is on Keflex she may be on Keflex for a pre-existing urinary tract infection. Urine culture sent. Patient may have enterococcus urinary tract infection therefore started on Unasyn in the emergency department. Wrist fracture casted by Dr. Mina Marble. Discussed with hospitalist and they will admit.   Shelda Jakes, MD 05/14/11 1735  Shelda Jakes, MD 05/14/11 2036

## 2011-05-15 ENCOUNTER — Encounter (HOSPITAL_COMMUNITY): Payer: Self-pay

## 2011-05-15 DIAGNOSIS — I2510 Atherosclerotic heart disease of native coronary artery without angina pectoris: Secondary | ICD-10-CM | POA: Insufficient documentation

## 2011-05-15 LAB — DIGOXIN LEVEL: Digoxin Level: 0.6 ng/mL — ABNORMAL LOW (ref 0.8–2.0)

## 2011-05-15 LAB — GLUCOSE, CAPILLARY
Glucose-Capillary: 164 mg/dL — ABNORMAL HIGH (ref 70–99)
Glucose-Capillary: 127 mg/dL — ABNORMAL HIGH (ref 70–99)
Glucose-Capillary: 177 mg/dL — ABNORMAL HIGH (ref 70–99)
Glucose-Capillary: 110 mg/dL — ABNORMAL HIGH (ref 70–99)
Glucose-Capillary: 64 mg/dL — ABNORMAL LOW (ref 70–99)
Glucose-Capillary: 225 mg/dL — ABNORMAL HIGH (ref 70–99)

## 2011-05-15 LAB — CBC
HCT: 34.1 % — ABNORMAL LOW (ref 36.0–46.0)
MCHC: 31.5 g/dL (ref 30.0–36.0)
MCHC: 33.7 g/dL (ref 30.0–36.0)
MCV: 85.5 fL (ref 78.0–100.0)
Platelets: 275 10*3/uL (ref 150–400)
RDW: 15 % (ref 11.5–15.5)
RDW: 14.9 % (ref 11.5–15.5)

## 2011-05-15 LAB — HEMOGLOBIN A1C
Hgb A1c MFr Bld: 6.9 % — ABNORMAL HIGH (ref <5.7)
Mean Plasma Glucose: 151 mg/dL — ABNORMAL HIGH (ref <117)

## 2011-05-15 LAB — CREATININE, SERUM: GFR calc Af Amer: 79 mL/min — ABNORMAL LOW (ref >90)

## 2011-05-15 MED ORDER — MORPHINE SULFATE 4 MG/ML IJ SOLN
INTRAMUSCULAR | Status: AC
  Administered 2011-05-15: 2 mg
  Filled 2011-05-15: qty 1

## 2011-05-15 MED ORDER — DEXTROSE 50 % IV SOLN: 25.0000 mL | Freq: Once | INTRAVENOUS | Status: DC

## 2011-05-15 MED ORDER — DEXTROSE 50 % IV SOLN
INTRAVENOUS | Status: AC
  Filled 2011-05-15: qty 50

## 2011-05-15 MED ORDER — OMEGA-3-ACID ETHYL ESTERS 1 G PO CAPS
1.0000 g | ORAL_CAPSULE | Freq: Every day | ORAL | Status: DC
  Administered 2011-05-15: 1 g via ORAL
  Filled 2011-05-15 (×4): qty 1

## 2011-05-15 MED ORDER — SODIUM CHLORIDE 0.9 % IV SOLN: INTRAVENOUS | Status: AC

## 2011-05-15 NOTE — Discharge Planning (Signed)
ED CM received Admission RN Consult for :  Patient from [facility] ED CM placed referral in [place] for [facility] for follow-up

## 2011-05-15 NOTE — ED Notes (Signed)
Attempted to call report to floor.  Floor nurse would not take report.

## 2011-05-15 NOTE — ED Notes (Signed)
Attempted to call report.  Report not taken by floor.

## 2011-05-15 NOTE — Progress Notes (Signed)
PHARMACIST - PHYSICIAN ORDER COMMUNICATION  CONCERNING: P&T Medication Policy on Herbal Medications  DESCRIPTION:  This patient's order for: Lutein and Flax Seed Oil    has been noted.  This product(s) is classified as an "herbal" or natural product. Due to a lack of definitive safety studies or FDA approval, nonstandard manufacturing practices, plus the potential risk of unknown drug-drug interactions while on inpatient medications, the Pharmacy and Therapeutics Committee does not permit the use of "herbal" or natural products of this type within [facility].   ACTION TAKEN: The pharmacy department is unable to verify this order at this time and your patient has been informed of this safety policy. Please reevaluate patient's clinical condition at discharge and address if the herbal or natural product(s) should be resumed at that time.   Lorenza Evangelist 05/15/2011 12:22 AM

## 2011-05-15 NOTE — Progress Notes (Signed)
Subjective: Some pain in R wrist, otherwise feels ok Objective: Vital signs in last 24 hours: Temp:  [97.6 F (36.4 C)-99.2 F (37.3 C)] 97.9 F (36.6 C) (11/27 0828) Pulse Rate:  [71-97] 88  (11/27 0828) Resp:  [16-17] 17  (11/27 0319) BP: (123-174)/(47-81) 143/58 mmHg (11/27 0828) SpO2:  [95 %-100 %] 95 % (11/27 0828) Weight change:     Intake/Output from previous day:   Physical Exam: General: Alert, awake, oriented x2, in no acute distress. HEENT: No bruits, no goiter. Heart: Regular rate and rhythm, without murmurs, rubs, gallops. Lungs: Clear to auscultation bilaterally. Abdomen: Soft, nontender, nondistended, positive bowel sounds. Extremities: R wrist in cast, No clubbing cyanosis or edema with positive pedal pulses. Neuro: moves all extremities, tremors noted, nonfocal.  Lab Results: Basic Metabolic Panel:  Basename 05/14/11 1552  NA 139  K 3.4*  CL 100  CO2 31  GLUCOSE 111*  BUN 30*  CREATININE 1.00  CALCIUM 9.3  MG --  PHOS --   Liver Function Tests: No results found for this basename: AST:2,ALT:2,ALKPHOS:2,BILITOT:2,PROT:2,ALBUMIN:2 in the last 72 hours No results found for this basename: LIPASE:2,AMYLASE:2 in the last 72 hours No results found for this basename: AMMONIA:2 in the last 72 hours CBC:  Basename 05/15/11 0459 05/14/11 1552  WBC 19.5* 23.9*  NEUTROABS -- --  HGB 11.2* 11.4*  HCT 35.5* 34.2*  MCV 86.8 86.8  PLT 275 266   Cardiac Enzymes: No results found for this basename: CKTOTAL:3,CKMB:3,CKMBINDEX:3,TROPONINI:3 in the last 72 hours BNP: No results found for this basename: POCBNP:3 in the last 72 hours D-Dimer: No results found for this basename: DDIMER:2 in the last 72 hours CBG:  Basename 05/15/11 0629 05/14/11 1750  GLUCAP 164* 94   Hemoglobin A1C:  Basename 05/15/11 0020  HGBA1C 6.9*   Fasting Lipid Panel: No results found for this basename: CHOL,HDL,LDLCALC,TRIG,CHOLHDL,LDLDIRECT in the last 72 hours Thyroid  Function Tests: No results found for this basename: TSH,T4TOTAL,FREET4,T3FREE,THYROIDAB in the last 72 hours Anemia Panel: No results found for this basename: VITAMINB12,FOLATE,FERRITIN,TIBC,IRON,RETICCTPCT in the last 72 hours Coagulation: No results found for this basename: LABPROT:2,INR:2 in the last 72 hours Urine Drug Screen: Drugs of Abuse  No results found for this basename: labopia, cocainscrnur, labbenz, amphetmu, thcu, labbarb    Alcohol Level: No results found for this basename: ETH:2 in the last 72 hours  No results found for this or any previous visit (from the past 240 hour(s)).  Studies/Results: Dg Chest 2 View  05/14/2011  *RADIOLOGY REPORT*  Clinical Data: Wrist fracture, preoperative evaluation  CHEST - 2 VIEW  Comparison: 02/13/2011  Findings: Several skin folds overlie the left chest.  Stable heart size and previous median sternotomy.  Negative for CHF or pneumonia.  No large effusion.  Atherosclerosis of the aortic branch vessels.  Trachea is midline. Coronary stents evident.  IMPRESSION: Postoperative findings.  No acute chest process.  Original Report Authenticated By: Judie Petit. Ruel Favors, M.D.   Dg Wrist Complete Right  05/14/2011  *RADIOLOGY REPORT*  Clinical Data: Deformity, fall  RIGHT WRIST - COMPLETE 3+ VIEW  Comparison: 02/03/2011  Findings: Bones appear osteopenic.  Acute displaced fractures of the right distal radius and ulna.  There is associated subluxation at the wrist.  Carpal bones and metacarpals appear intact.  Diffuse soft tissue swelling.  IMPRESSION: Acute displaced fractures of the right distal radius and ulna with associated subluxation.  Per CMS PQRS reporting requirements (PQRS Measure 24): Given the patient's age of greater than 50 and the fracture  site (hip, distal radius, or spine), the patient should be tested for osteoporosis using DXA, and the appropriate treatment considered based on the DXA results.  Original Report Authenticated By: Judie Petit. Ruel Favors, M.D.    Medications: Scheduled Meds:   . ALPRAZolam  0.25 mg Oral QHS  . ampicillin-sulbactam (UNASYN) IV  1.5 g Intravenous Q6H  . ampicillin-sulbactam (UNASYN) IV  3 g Intravenous Once  . Boost  237 mL Oral BID  . busPIRone  10 mg Oral TID  . carbidopa-levodopa  1 tablet Oral QID  . cholecalciferol  2,000 Units Oral Daily  . clopidogrel  75 mg Oral Daily  . digoxin  125 mcg Oral Daily  . docusate sodium  100 mg Oral BID  . enoxaparin  40 mg Subcutaneous QHS  . fenofibrate  160 mg Oral Daily  . furosemide  80 mg Oral Daily  . insulin aspart  0-9 Units Subcutaneous TID WC  . lidocaine  20 mL Other Once  . magnesium oxide  400 mg Oral Daily  .  morphine injection  2 mg Intravenous Once  .  morphine injection  2 mg Intravenous Once  .  morphine injection  2 mg Intravenous Once  . omega-3 acid ethyl esters  1 g Oral Daily  . polyethylene glycol  17 g Oral Daily  . potassium chloride SA  20 mEq Oral Daily  . QUEtiapine  25 mg Oral QHS  . sodium chloride  3 mL Intravenous Q12H  . DISCONTD: cefTRIAXone (ROCEPHIN) IV  1 g Intravenous Once  . DISCONTD: fish oil-omega-3 fatty acids  1 g Oral Daily  . DISCONTD: Flax Seed Oil  1 capsule Oral Daily  . DISCONTD: Lutein  1 tablet Oral Daily   Continuous Infusions:  PRN Meds:.sodium chloride, acetaminophen, acetaminophen, HYDROcodone-acetaminophen, morphine, nitroGLYCERIN, ondansetron (ZOFRAN) IV, ondansetron, sodium chloride  Assessment/Plan: 1. Fracture of right wrist: for ORIF per Dr. Mina Marble tomorrow Pain control 2. Fall/ Parkinson's disease: get Pt/OT eval 3. A-fib: continue Asa, rate controlled on digoxin 4. DM (diabetes mellitus): SSI 5.  UTI (urinary tract infection): continue Unasyn, await urine cultures 6. Leukocytosis: due to UTI, stress de-margination, improving, afebrile now 7. Parkinson's disease: continue levodopa/carbidopa 8. DVt prophylaxis:lovenox    LOS: 1 day   Treyson Axel 05/15/2011, 8:58 AM

## 2011-05-16 ENCOUNTER — Encounter (HOSPITAL_COMMUNITY): Payer: Self-pay | Admitting: Anesthesiology

## 2011-05-16 ENCOUNTER — Encounter (HOSPITAL_COMMUNITY): Payer: Self-pay

## 2011-05-16 ENCOUNTER — Encounter (HOSPITAL_COMMUNITY)
Admission: EM | Disposition: A | Discharge status: Skilled Nursing Facility | Payer: Self-pay | Source: Home / Self Care | Attending: Internal Medicine

## 2011-05-16 ENCOUNTER — Inpatient Hospital Stay (HOSPITAL_COMMUNITY): Payer: Medicare Other | Admitting: Anesthesiology

## 2011-05-16 LAB — GLUCOSE, CAPILLARY
Glucose-Capillary: 94 mg/dL (ref 70–99)
Glucose-Capillary: 76 mg/dL (ref 70–99)
Glucose-Capillary: 161 mg/dL — ABNORMAL HIGH (ref 70–99)
Glucose-Capillary: 160 mg/dL — ABNORMAL HIGH (ref 70–99)

## 2011-05-16 LAB — BASIC METABOLIC PANEL
CO2: 31 mEq/L (ref 19–32)
Chloride: 105 mEq/L (ref 96–112)
Creatinine, Ser: 0.78 mg/dL (ref 0.50–1.10)
Glucose, Bld: 83 mg/dL (ref 70–99)

## 2011-05-16 LAB — CBC
HCT: 30.3 % — ABNORMAL LOW (ref 36.0–46.0)
Hemoglobin: 9.8 g/dL — ABNORMAL LOW (ref 12.0–15.0)
MCV: 87.8 fL (ref 78.0–100.0)
Platelets: 219 10*3/uL (ref 150–400)
RBC: 3.45 MIL/uL — ABNORMAL LOW (ref 3.87–5.11)
WBC: 20.2 10*3/uL — ABNORMAL HIGH (ref 4.0–10.5)

## 2011-05-16 LAB — URINE CULTURE

## 2011-05-16 LAB — SURGICAL PCR SCREEN
MRSA, PCR: NEGATIVE
Staphylococcus aureus: NEGATIVE

## 2011-05-16 SURGERY — OPEN REDUCTION INTERNAL FIXATION (ORIF) DISTAL RADIUS FRACTURE
Anesthesia: General | Site: Arm Lower | Wound class: Clean

## 2011-05-16 MED ORDER — SODIUM CHLORIDE 0.9 % IR SOLN
Status: DC | PRN
  Administered 2011-05-16: 1

## 2011-05-16 MED ORDER — HALOPERIDOL LACTATE 5 MG/ML IJ SOLN
2.5000 mg | INTRAMUSCULAR | Status: AC | PRN
  Administered 2011-05-16 – 2011-05-17 (×2): 2.5 mg via INTRAVENOUS
  Filled 2011-05-16: qty 1

## 2011-05-16 MED ORDER — ENOXAPARIN SODIUM 30 MG/0.3ML ~~LOC~~ SOLN
30.0000 mg | Freq: Two times a day (BID) | SUBCUTANEOUS | Status: DC
  Administered 2011-05-17 – 2011-05-18 (×2): 30 mg via SUBCUTANEOUS
  Filled 2011-05-16 (×6): qty 0.3

## 2011-05-16 MED ORDER — ONDANSETRON HCL 4 MG/2ML IJ SOLN
INTRAMUSCULAR | Status: DC | PRN
  Administered 2011-05-16 (×2): 2 mg via INTRAVENOUS

## 2011-05-16 MED ORDER — LACTATED RINGERS IV SOLN
INTRAVENOUS | Status: AC
  Administered 2011-05-16: 1000 mL via INTRAVENOUS

## 2011-05-16 MED ORDER — LIDOCAINE HCL 1 % IJ SOLN
INTRAMUSCULAR | Status: DC | PRN
  Administered 2011-05-16: 50 mg via INTRADERMAL

## 2011-05-16 MED ORDER — LACTATED RINGERS IV SOLN: INTRAVENOUS | Status: DC

## 2011-05-16 MED ORDER — HALOPERIDOL LACTATE 5 MG/ML IJ SOLN
INTRAMUSCULAR | Status: AC
  Administered 2011-05-16: 2.5 mg via INTRAVENOUS
  Filled 2011-05-16: qty 1

## 2011-05-16 MED ORDER — ENOXAPARIN SODIUM 30 MG/0.3ML ~~LOC~~ SOLN: 30.0000 mg | Freq: Two times a day (BID) | SUBCUTANEOUS | Status: DC

## 2011-05-16 MED ORDER — FENTANYL CITRATE 0.05 MG/ML IJ SOLN
INTRAMUSCULAR | Status: DC | PRN
  Administered 2011-05-16: 25 ug via INTRAVENOUS
  Administered 2011-05-16: 50 ug via INTRAVENOUS
  Administered 2011-05-16: 25 ug via INTRAVENOUS

## 2011-05-16 MED ORDER — CEFAZOLIN SODIUM 1-5 GM-% IV SOLN
1.0000 g | INTRAVENOUS | Status: AC
  Administered 2011-05-16: 1 g via INTRAVENOUS
  Filled 2011-05-16: qty 50

## 2011-05-16 MED ORDER — LABETALOL HCL 5 MG/ML IV SOLN
INTRAVENOUS | Status: DC | PRN
  Administered 2011-05-16: 10 mg via INTRAVENOUS

## 2011-05-16 MED ORDER — BUPIVACAINE HCL (PF) 0.25 % IJ SOLN
INTRAMUSCULAR | Status: DC | PRN
  Administered 2011-05-16: 10 mL

## 2011-05-16 MED ORDER — FENTANYL CITRATE 0.05 MG/ML IJ SOLN: 25.0000 ug | INTRAMUSCULAR | Status: DC | PRN

## 2011-05-16 MED ORDER — BENZTROPINE MESYLATE 0.5 MG PO TABS
0.5000 mg | ORAL_TABLET | Freq: Once | ORAL | Status: DC
  Filled 2011-05-16: qty 1

## 2011-05-16 MED ORDER — LACTATED RINGERS IV SOLN
INTRAVENOUS | Status: DC | PRN
  Administered 2011-05-16 (×2): via INTRAVENOUS

## 2011-05-16 MED ORDER — MIDAZOLAM HCL 5 MG/5ML IJ SOLN
INTRAMUSCULAR | Status: DC | PRN
  Administered 2011-05-16: 2 mg via INTRAVENOUS

## 2011-05-16 MED ORDER — PROPOFOL 10 MG/ML IV EMUL
INTRAVENOUS | Status: DC | PRN
  Administered 2011-05-16: 90 mg via INTRAVENOUS

## 2011-05-16 MED ORDER — PROMETHAZINE HCL 25 MG/ML IJ SOLN: 6.2500 mg | INTRAMUSCULAR | Status: DC | PRN

## 2011-05-16 MED ORDER — VITAMINS A & D EX OINT
TOPICAL_OINTMENT | CUTANEOUS | Status: AC
  Administered 2011-05-16: 5
  Filled 2011-05-16: qty 5

## 2011-05-16 MED ORDER — CHLORHEXIDINE GLUCONATE 4 % EX LIQD
60.0000 mL | Freq: Once | CUTANEOUS | Status: DC
  Filled 2011-05-16: qty 60

## 2011-05-16 SURGICAL SUPPLY — 48 items
BAG SPEC THK2 15X12 ZIP CLS (MISCELLANEOUS)
BAG ZIPLOCK 12X15 (MISCELLANEOUS) IMPLANT
BANDAGE ELASTIC 3 VELCRO ST LF (GAUZE/BANDAGES/DRESSINGS) ×2 IMPLANT
BANDAGE ELASTIC 4 VELCRO ST LF (GAUZE/BANDAGES/DRESSINGS) ×2 IMPLANT
BIT DRILL 2 FAST STEP (BIT) ×2 IMPLANT
BIT DRILL 2.5X4 QC (BIT) ×2 IMPLANT
BNDG CMPR 9X4 STRL LF SNTH (GAUZE/BANDAGES/DRESSINGS) ×1
BNDG ESMARK 4X9 LF (GAUZE/BANDAGES/DRESSINGS) ×2 IMPLANT
CANISTER SUCTION 2500CC (MISCELLANEOUS) IMPLANT
CLOTH BEACON ORANGE TIMEOUT ST (SAFETY) IMPLANT
CORDS BIPOLAR (ELECTRODE) ×2 IMPLANT
DRAPE LG THREE QUARTER DISP (DRAPES) IMPLANT
DRAPE OEC MINIVIEW 54X84 (DRAPES) ×2 IMPLANT
DRAPE U-SHAPE 47X51 STRL (DRAPES) IMPLANT
DRSG PAD ABDOMINAL 8X10 ST (GAUZE/BANDAGES/DRESSINGS) ×2 IMPLANT
DRSG XEROFORM 1X8 (GAUZE/BANDAGES/DRESSINGS) ×2 IMPLANT
DURAPREP 26ML APPLICATOR (WOUND CARE) ×2 IMPLANT
ELECT REM PT RETURN 9FT ADLT (ELECTROSURGICAL) ×2
ELECTRODE REM PT RTRN 9FT ADLT (ELECTROSURGICAL) ×1 IMPLANT
GAUZE XEROFORM 1X8 LF (GAUZE/BANDAGES/DRESSINGS) ×2 IMPLANT
GLOVE BIO SURGEON STRL SZ8 (GLOVE) ×4 IMPLANT
GLOVE ECLIPSE 8.0 STRL XLNG CF (GLOVE) IMPLANT
GOWN STRL NON-REIN LRG LVL3 (GOWN DISPOSABLE) ×2 IMPLANT
NEEDLE HYPO 22GX1.5 SAFETY (NEEDLE) ×2 IMPLANT
NS IRRIG 1000ML POUR BTL (IV SOLUTION) ×2 IMPLANT
PACK LOWER EXTREMITY WL (CUSTOM PROCEDURE TRAY) ×2 IMPLANT
PADDING WEBRIL 4 STERILE (GAUZE/BANDAGES/DRESSINGS) ×2 IMPLANT
PEG SUBCHONDRAL SMOOTH 2.0X18 (Peg) ×2 IMPLANT
PEG SUBCHONDRAL SMOOTH 2.0X20 (Peg) ×8 IMPLANT
PEG SUBCHONDRAL SMOOTH 2.0X22 (Peg) ×2 IMPLANT
PLATE STAN 24.4X59.5 RT (Plate) ×2 IMPLANT
POSITIONER SURGICAL ARM (MISCELLANEOUS) ×2 IMPLANT
SCREW BN 12X3.5XNS CORT TI (Screw) ×1 IMPLANT
SCREW CORT 3.5X10 LNG (Screw) ×4 IMPLANT
SCREW CORT 3.5X12 (Screw) ×2 IMPLANT
SOL PREP PROV IODINE SCRUB 4OZ (MISCELLANEOUS) IMPLANT
SPLINT PLASTER X FASTSET 4X15 (CAST SUPPLIES) ×2 IMPLANT
SPONGE GAUZE 4X4 FOR O.R. (GAUZE/BANDAGES/DRESSINGS) ×2 IMPLANT
STAPLER VISISTAT 35W (STAPLE) ×2 IMPLANT
STRIP CLOSURE SKIN 1/2X4 (GAUZE/BANDAGES/DRESSINGS) IMPLANT
SUCTION FRAZIER TIP 10 FR DISP (SUCTIONS) IMPLANT
SUT PROLENE 3 0 PS 2 (SUTURE) IMPLANT
SUT VIC AB 3-0 PS2 18 (SUTURE) ×2
SUT VIC AB 3-0 PS2 18XBRD (SUTURE) ×1 IMPLANT
SUT VIC AB 4-0 PS2 27 (SUTURE) ×2 IMPLANT
SYR CONTROL 10ML LL (SYRINGE) ×2 IMPLANT
TOWEL OR 17X26 10 PK STRL BLUE (TOWEL DISPOSABLE) ×4 IMPLANT
WATER STERILE IRR 1500ML POUR (IV SOLUTION) IMPLANT

## 2011-05-16 NOTE — Progress Notes (Signed)
Rec'd phone call from Dr. Ronie Spies office regarding this patient and the need for SNF at d/c.  She is from ALF [facility]?) and will likely need SNF post op given recent falls and wrist fx.  Bethany Corcoran,LCSW, to follow up with patient and proceed with assessment and further d/c planning. Reece Levy, MSW, Theresia Majors [phone]

## 2011-05-16 NOTE — Brief Op Note (Signed)
05/14/2011 - 05/16/2011  12:23 PM  PATIENT:  April Baker  77 y.o. female  PRE-OPERATIVE DIAGNOSIS:  right distal radius fracture  POST-OPERATIVE DIAGNOSIS:  right distal radius fracture   PROCEDURE:  Procedure(s): OPEN REDUCTION INTERNAL FIXATION (ORIF) DISTAL RADIAL FRACTURE  SURGEON:  Surgeon(s): Marlowe Shores, MD  PHYSICIAN ASSISTANT:   ASSISTANTS: none   ANESTHESIA:   general  EBL:  Total I/O In: 1000 [I.V.:1000] Out: -   BLOOD ADMINISTERED:none  DRAINS: none   LOCAL MEDICATIONS USED:  MARCAINE 10CC  SPECIMEN:  No Specimen  DISPOSITION OF SPECIMEN:  N/A  COUNTS:  YES  TOURNIQUET:   Total Tourniquet Time Documented: Upper Arm (Right) - 51 minutes  DICTATION: .Other Dictation: Dictation Number [id number]  PLAN OF CARE: Admit to inpatient   PATIENT DISPOSITION:  PACU - hemodynamically stable.   Delay start of Pharmacological VTE agent (>24hrs) due to surgical blood loss or risk of bleeding:  {YES/NO/NOT APPLICABLE:20182

## 2011-05-16 NOTE — Transfer of Care (Deleted)
Immediate Anesthesia Transfer of Care Note  Patient: April Baker  Procedure(s) Performed:  OPEN REDUCTION INTERNAL FIXATION (ORIF) DISTAL RADIAL FRACTURE - and Ulna   Patient Location: PACU  Anesthesia Type: General  Level of Consciousness: awake, oriented and responds to stimulation  Airway & Oxygen Therapy: Patient Spontanous Breathing and Patient connected to face mask oxygen  Post-op Assessment: Report given to PACU RN and Post -op Vital signs reviewed and stable  Post vital signs: stable  Complications: No apparent anesthesia complications

## 2011-05-16 NOTE — Anesthesia Postprocedure Evaluation (Signed)
  Anesthesia Post-op Note  Patient: April Baker  Procedure(s) Performed:  OPEN REDUCTION INTERNAL FIXATION (ORIF) DISTAL RADIAL FRACTURE - and Ulna   Patient Location: PACU  Anesthesia Type: General  Level of Consciousness: awake and alert   Airway and Oxygen Therapy: Patient Spontanous Breathing with oxygen  Post-op Pain: mild  Post-op Assessment: Post-op Vital signs reviewed, Patient's Cardiovascular Status Stable, Respiratory Function Stable, Patent Airway and No signs of Nausea or vomiting  Post-op Vital Signs: stable  Complications: No apparent anesthesia complications

## 2011-05-16 NOTE — Transfer of Care (Signed)
Immediate Anesthesia Transfer of Care Note  Patient: April Baker  Procedure(s) Performed:  OPEN REDUCTION INTERNAL FIXATION (ORIF) DISTAL RADIAL FRACTURE - and Ulna   Patient Location: PACU  Anesthesia Type: General  Level of Consciousness: awake, sedated and responds to stimulation  Airway & Oxygen Therapy: Patient Spontanous Breathing and Patient connected to face mask oxygen  Post-op Assessment: Report given to PACU RN and Post -op Vital signs reviewed and stable  Post vital signs: stable  Complications: No apparent anesthesia complications

## 2011-05-16 NOTE — Preoperative (Signed)
Beta Blockers   Reason not to administer Beta Blockers:Not Applicable 

## 2011-05-16 NOTE — Op Note (Signed)
[name], April Baker NO.:  [id number]  MEDICAL RECORD NO.:  [id number]  LOCATION:  1312                         FACILITY:  [facility]  PHYSICIAN:  Artist Pais. Seon Gaertner, M.D.DATE OF BIRTH:  [birth year]  DATE OF PROCEDURE:  05/16/2011 DATE OF DISCHARGE:                              OPERATIVE REPORT   PREOPERATIVE DIAGNOSIS:  Displaced radius and ulnar fracture, distal right side.  POSTOPERATIVE DIAGNOSIS:  Displaced radius and ulnar fracture, distal right side.  PROCEDURE:  Open reduction, internal fixation, right distal radius fracture, with DVR plate and screws.  Closed treatment of ulnar fracture.  SURGEON:  Artist Pais. Mina Marble, M.D.  ASSISTANT:  None.  ANESTHESIA:  General.  TOURNIQUET TIME:  50 minutes.  No complications.  No drains.  The patient was taken to operating suite.  After induction of general anesthesia, right upper extremity prepped and draped in sterile fashion. Esmarch was used to exsanguinate the limb.  Tourniquet inflated to 250 mmHg.  At this point in time, a standard volar approach to the distal radius was undertaken.  Skin was incised on the palpable border of the flexor carpi radialis tendon.  The sheath was incised.  The FCR was tracked in the midline, radial artery at the lateral side.  Dissection was carried down the level of pronator quadratus with subperiosteally stripped off the volar aspect of distal radius revealing an old fracture, which had been treated nonoperatively that had healed in apex volar angulation, and shortening of the fracture site was taken down. Reduction was performed with release of brachioradialis and extreme ulnar deviation and flexion.  Once this was done, a standard DVR plate was placed in a volar aspect of the distal radius, fixed with 3 proximal screws and smooth pegs distally.  The ulna was reduced closed. Intraoperative fluoroscopy revealed adequate reduction in AP, lateral, oblique view.  The  wound was thoroughly irrigated.  It was loosely closed in layers with 2-0 undyed Vicryl, and staples on the skin.  Steri-Strips, Xeroform, 4 x 4s, fluffs, and a volar splint was applied.  The patient tolerated the procedure well and went to recovery room in stable fashion.     Artist Pais Mina Marble, M.D.     MAW/MEDQ  D:  05/16/2011  T:  05/16/2011  Job:  [id number]

## 2011-05-16 NOTE — Anesthesia Preprocedure Evaluation (Addendum)
Anesthesia Evaluation  Patient identified by MRN, date of birth, ID band Patient awake    Reviewed: Allergy & Precautions, H&P , NPO status , Patient's Chart, lab work & pertinent test results  Airway Mallampati: II TM Distance: >3 FB Neck ROM: full    Dental  (+) Teeth Intact, Dental Advisory Given and Caps,    Pulmonary neg pulmonary ROS,  clear to auscultation  Pulmonary exam normal       Cardiovascular Exercise Tolerance: Good hypertension, On Medications + CAD, + Past MI and neg cardio ROS + dysrhythmias Atrial Fibrillation + Valvular Problems/Murmurs MVP regular Normal Last ecg is nsr.  S/p CABG.  OK since   Neuro/Psych PSYCHIATRIC DISORDERS dementiaParkinsons disease.  Unilateral carotid stenosis.  Neuromuscular disease    GI/Hepatic negative GI ROS, Neg liver ROS, Poorly Controlled,  Endo/Other  Negative Endocrine ROSDiabetes mellitus-, Well Controlled, Type 2, Insulin Dependent  Renal/GU negative Renal ROS  Genitourinary negative   Musculoskeletal   Abdominal   Peds  Hematology negative hematology ROS (+)   Anesthesia Other Findings   Reproductive/Obstetrics negative OB ROS                       Anesthesia Physical Anesthesia Plan  ASA: II  Anesthesia Plan: General   Post-op Pain Management:    Induction: Intravenous  Airway Management Planned: LMA  Additional Equipment:   Intra-op Plan:   Post-operative Plan:   Informed Consent: I have reviewed the patients History and Physical, chart, labs and discussed the procedure including the risks, benefits and alternatives for the proposed anesthesia with the patient or authorized representative who has indicated his/her understanding and acceptance.   Dental Advisory Given  Plan Discussed with: CRNA and Surgeon  Anesthesia Plan Comments:        Anesthesia Quick Evaluation

## 2011-05-16 NOTE — Progress Notes (Signed)
UR CHART REVIEWED 

## 2011-05-16 NOTE — H&P (Signed)
  See previous noted  preop for orif right radius ulna fractures today  Have spoken to patient and daughter and they wish to proceed  Will follow post op with medicine service with regards to post op placement

## 2011-05-16 NOTE — Progress Notes (Signed)
Subjective: Patient seen and examined ,sleepy after given pain meds this morning ,now feeling better   Objective: Vital signs in last 24 hours: Temp:  [97.5 F (36.4 C)-98.8 F (37.1 C)] 97.5 F (36.4 C) (11/28 0629) Pulse Rate:  [71-81] 78  (11/28 0629) Resp:  [16-20] 16  (11/28 0629) BP: (119-172)/(48-65) 133/57 mmHg (11/28 0629) SpO2:  [95 %-97 %] 95 % (11/28 0629) Weight:  [44 kg (97 lb)-45.768 kg (100 lb 14.4 oz)] 100 lb 14.4 oz (45.768 kg) (11/28 0629) Weight change:     Intake/Output from previous day: 11/27 0701 - 11/28 0700 In: -  Out: 1975 [Urine:1975]     Physical Exam: General: sleepy , in no acute distress. Heart: Regular rate and rhythm, without murmurs, rubs, gallops. Lungs: Clear to auscultation bilaterally. Abdomen: Soft, nontender, nondistended, positive bowel sounds. Extremities: No clubbing cyanosis or edema with positive pedal pulses. Right UE cast     Lab Results: Results for orders placed during the hospital encounter of 05/14/11 (from the past 24 hour(s))  GLUCOSE, CAPILLARY     Status: Abnormal   Collection Time   05/15/11  9:51 AM      Component Value Range   Glucose-Capillary 127 (*) 70 - 99 (mg/dL)  GLUCOSE, CAPILLARY     Status: Abnormal   Collection Time   05/15/11 11:46 AM      Component Value Range   Glucose-Capillary 177 (*) 70 - 99 (mg/dL)  CBC     Status: Abnormal   Collection Time   05/15/11  1:00 PM      Component Value Range   WBC 19.9 (*) 4.0 - 10.5 (K/uL)   RBC 3.99  3.87 - 5.11 (MIL/uL)   Hemoglobin 11.5 (*) 12.0 - 15.0 (g/dL)   HCT 16.1 (*) 09.6 - 46.0 (%)   MCV 85.5  78.0 - 100.0 (fL)   MCH 28.8  26.0 - 34.0 (pg)   MCHC 33.7  30.0 - 36.0 (g/dL)   RDW 04.5  40.9 - 81.1 (%)   Platelets 261  150 - 400 (K/uL)  CREATININE, SERUM     Status: Abnormal   Collection Time   05/15/11  1:00 PM      Component Value Range   Creatinine, Ser 0.82  0.50 - 1.10 (mg/dL)   GFR calc non Af Amer 68 (*) >90 (mL/min)   GFR calc Af  Amer 79 (*) >90 (mL/min)  GLUCOSE, CAPILLARY     Status: Abnormal   Collection Time   05/15/11  3:49 PM      Component Value Range   Glucose-Capillary 110 (*) 70 - 99 (mg/dL)  GLUCOSE, CAPILLARY     Status: Abnormal   Collection Time   05/15/11  5:33 PM      Component Value Range   Glucose-Capillary 64 (*) 70 - 99 (mg/dL)  GLUCOSE, CAPILLARY     Status: Abnormal   Collection Time   05/15/11  6:54 PM      Component Value Range   Glucose-Capillary 225 (*) 70 - 99 (mg/dL)  GLUCOSE, CAPILLARY     Status: Abnormal   Collection Time   05/15/11  8:41 PM      Component Value Range   Glucose-Capillary 169 (*) 70 - 99 (mg/dL)   Comment 1 Notify RN    CBC     Status: Abnormal   Collection Time   05/16/11  8:05 AM      Component Value Range   WBC 20.2 (*) 4.0 - 10.5 (  K/uL)   RBC 3.45 (*) 3.87 - 5.11 (MIL/uL)   Hemoglobin 9.8 (*) 12.0 - 15.0 (g/dL)   HCT 04.5 (*) 40.9 - 46.0 (%)   MCV 87.8  78.0 - 100.0 (fL)   MCH 28.4  26.0 - 34.0 (pg)   MCHC 32.3  30.0 - 36.0 (g/dL)   RDW 81.1  91.4 - 78.2 (%)   Platelets 219  150 - 400 (K/uL)  GLUCOSE, CAPILLARY     Status: Normal   Collection Time   05/16/11  8:18 AM      Component Value Range   Glucose-Capillary 94  70 - 99 (mg/dL)    Studies/Results:  Medications:    . sodium chloride   Intravenous STAT  . ALPRAZolam  0.25 mg Oral QHS  . ampicillin-sulbactam (UNASYN) IV  1.5 g Intravenous Q6H  . benztropine  0.5 mg Oral Once  . Boost  237 mL Oral BID  . busPIRone  10 mg Oral TID  . carbidopa-levodopa  1 tablet Oral QID  . cholecalciferol  2,000 Units Oral Daily  . clopidogrel  75 mg Oral Daily  . dextrose  25 mL Intravenous Once  . dextrose      . digoxin  125 mcg Oral Daily  . docusate sodium  100 mg Oral BID  . enoxaparin  40 mg Subcutaneous QHS  . fenofibrate  160 mg Oral Daily  . furosemide  80 mg Oral Daily  . insulin aspart  0-9 Units Subcutaneous TID WC  . magnesium oxide  400 mg Oral Daily  . morphine      . omega-3  acid ethyl esters  1 g Oral Daily  . polyethylene glycol  17 g Oral Daily  . potassium chloride SA  20 mEq Oral Daily  . QUEtiapine  25 mg Oral QHS  . DISCONTD: sodium chloride  3 mL Intravenous Q12H    acetaminophen, acetaminophen, haloperidol lactate, HYDROcodone-acetaminophen, morphine, nitroGLYCERIN, ondansetron (ZOFRAN) IV, ondansetron, DISCONTD: sodium chloride, DISCONTD: sodium chloride     Assessment/Plan:  1. Fracture of right wrist:  for ORIF per Dr. Mina Marble today Noted to be on plavix ,will inform ortho  Pain control  2. Fall/ Parkinson's disease:  Pt/OT eval  Pending  3. A-fib: continue Asa, rate controlled on digoxin  4. DM (diabetes mellitus): SSI  5. UTI (urinary tract infection): continue Unasyn, urine cultures grew enterococcus sp ,follow sensitivity. 6. Leukocytosis: due to UTI, stress de-margination, afebrile . 7. Parkinson's disease: continue levodopa/carbidopa  8. DVt prophylaxis:lovenox    LOS: 2 days   April Baker 05/16/2011, 8:38 AM

## 2011-05-17 LAB — GLUCOSE, CAPILLARY

## 2011-05-17 LAB — CBC
Hemoglobin: 9.9 g/dL — ABNORMAL LOW (ref 12.0–15.0)
Platelets: 250 10*3/uL (ref 150–400)
RBC: 3.5 MIL/uL — ABNORMAL LOW (ref 3.87–5.11)
WBC: 22.4 10*3/uL — ABNORMAL HIGH (ref 4.0–10.5)

## 2011-05-17 LAB — BASIC METABOLIC PANEL
CO2: 27 mEq/L (ref 19–32)
Chloride: 103 mEq/L (ref 96–112)
Potassium: 3.8 mEq/L (ref 3.5–5.1)
Sodium: 142 mEq/L (ref 135–145)

## 2011-05-17 MED ORDER — HALOPERIDOL LACTATE 5 MG/ML IJ SOLN
2.5000 mg | INTRAMUSCULAR | Status: DC | PRN
  Filled 2011-05-17: qty 1

## 2011-05-17 MED ORDER — LEVOFLOXACIN 250 MG PO TABS
250.0000 mg | ORAL_TABLET | Freq: Every day | ORAL | Status: DC
  Administered 2011-05-17 – 2011-05-18 (×2): 250 mg via ORAL
  Filled 2011-05-17 (×2): qty 1

## 2011-05-17 NOTE — Progress Notes (Signed)
Psychosocial assessment completed and placed in shadow chart.   CSW met w/patient and patients caregiver, Delice Bison,  at bedside. Patient was sleeping. Delice Bison S/that pts daughter, Alpha Gula, [phone], is pts healthcare POA and will make a decision regarding d/c planning. CSW called pts daughter, Morley Kos, She s/that pt was recently in [facility] and was discharged from there on Halloween. Patient has used 60 of her 100 medicare days. She s/that she spoke w/them and they will have a bed available. Called [facility]. They should be able to take patient back. CSW will send information to [facility].  Gurinder Toral C. Kassie Keng MSW, LCSW [phone]

## 2011-05-17 NOTE — Progress Notes (Signed)
Patient ID: April Baker, female   DOB: 12-Feb-1933, 77 y.o.   MRN: [id number] Doing well post op  Will need to see in my office next week   Do not change dressing

## 2011-05-17 NOTE — Plan of Care (Signed)
Problem: Phase II Progression Outcomes Goal: Progress activity as tolerated unless otherwise ordered Outcome: Not Progressing Due to pain and agitation

## 2011-05-17 NOTE — Progress Notes (Signed)
Physical Therapy Evaluation Patient Details Name: April Baker MRN: [id number] DOB: 05-Dec-1932 Today's Date: 05/17/2011 13:10-13:45 EVII  Recommend SNF  Problem List:  Patient Active Problem List  Diagnoses  . Fracture of right wrist  . Fall  . Parkinson's disease  . A-fib  . DM (diabetes mellitus)  . UTI (urinary tract infection)  . CAD (coronary artery disease)    Past Medical History:  Past Medical History  Diagnosis Date  . Hypertension   . Atrial fibrillation   . Parkinson's disease   . Myocardial infarction   . Coronary artery disease   . Hyperlipidemia   . Dementia   . Diabetes mellitus   . Anxiety   . MVP (mitral valve prolapse)     History of  . Unilateral carotid artery stenosis   . Wrist fracture, right   . Peripheral neuropathy   . Lower extremity numbness    Past Surgical History:  Past Surgical History  Procedure Date  . Inner ear surgery 1985    with metal placement-no mri  . Cesarean section 1964  . Wrist reconstruction 1991  . Anal fistulotomy 08/12/2000  . Coronary artery bypass graft   . Cardiac catheterization   . Sternal wire removal 10/29/2003    I&D of 2 upper sternal wires    PT Assessment/Plan/Recommendation PT Assessment  Pt with history of falls, admitted to hospital with fx radius and underwent ORIF.  Pt with agitation post op. Clinical Impression Statement: pt unable to participate or tolerate PT today.  However, she will need conitnued PT at discharge once post op pain subsides to increase mobility and restore the function of right hand PT Recommendation/Assessment: Patient will need skilled PT in the acute care venue;All further PT needs can be met in the next venue of care PT Problem List: Decreased range of motion;Decreased activity tolerance;Decreased mobility;Pain;Decreased safety awareness Barriers to Discharge: Decreased caregiver support PT Therapy Diagnosis : Difficulty walking;Generalized weakness;Acute pain PT  Plan PT Frequency: Min 3X/week PT Treatment/Interventions: Functional mobility training;Gait training PT Recommendation Recommendations for Other Services: OT consult Follow Up Recommendations: Skilled nursing facility Equipment Recommended: Defer to next venue PT Goals  Acute Rehab PT Goals PT Goal Formulation: Patient unable to participate in goal setting Pt will go Supine/Side to Sit: with min assist PT Goal: Supine/Side to Sit - Progress: Not met Pt will Sit at [facility] of Bed: with supervision;3-5 min PT Goal: Sit at Edge Of Bed - Progress: Not met Pt will Transfer Sit to Stand/Stand to Sit: with min assist PT Transfer Goal: Sit to Stand/Stand to Sit - Progress: Not met  PT Evaluation Precautions/Restrictions  Precautions Precaution Comments: pt with ORIF of right forearm, rigid dressing in place Prior Functioning      Cognition Cognition Arousal/Alertness: Awake/alert (pt keeps eyes closed, family reports this is normail) Overall Cognitive Status: Impaired Orientation Level: Disoriented X4 Sensation/Coordination   Extremity Assessment RUE Assessment RUE Assessment: Exceptions to [facility] (short arm cast) RUE AROM (degrees) Overall AROM Right Upper Extremity: Due to pain (edema in fingers, positioned with hand above elbow level) LUE Assessment LUE Assessment: Within Functional Limits (pt in mitt because of pinching, pulling) RLE Assessment RLE Assessment: Within Functional Limits LLE Assessment LLE Assessment: Within Functional Limits Mobility (including Balance) Bed Mobility Bed Mobility: Yes Rolling Right: 1: +2 Total assist (pt =0%) Rolling Right Details (indicate cue type and reason): pt resistant to movement, cries out with mobility Supine to Sit: 1: +2 Total assist (pt=0%) Sit to Supine -  Left: 1: +2 Total assist (pt = 0%) Transfers Sit to Stand: 1: +2 Total assist Sit to Stand Details (indicate cue type and reason): Pt unable despite several attempts at standing.  c/o pain in shoulder with attempts to raise arm to platform walker...unable to perform Ambulation/Gait Ambulation/Gait: No Stairs: No Wheelchair Mobility Wheelchair Mobility: No    Exercise    End of Session PT - End of Session Activity Tolerance: Treatment limited secondary to agitation;Patient limited by pain Patient left: in bed;with bed alarm set (sitter present) General Behavior During Session: Agitated Cognition: Impaired, at baseline  Donnetta Hail 05/17/2011, 2:17 PM

## 2011-05-17 NOTE — Discharge Instructions (Signed)
Wrist Fracture Your caregiver has diagnosed you as having a fracture of the wrist. A fracture is a break in the bone or bones. A cast or splint is used to protect and keep your injured bone(s) from moving. The cast or splint will usually be on for about 5 to 6 weeks. One of the bones of the wrist (the navicular bone) often does not show up as a fracture on X-ray until later or in the healing phase. With this bone your caregiver will often cast as though it is fractured even if not seen on the X-ray. HOME CARE INSTRUCTIONS   To lessen the swelling, keep the injured part elevated while sitting or lying down. Keeping the injury above the level of your heart (the center of the chest) will decrease swelling and pain.   Do not wear rings or jewelry on the injured hand or wrist.   Apply ice to the injury for 15 to 20 minutes, 3 to 4 times per day while awake for 2 days. Put the ice in a plastic bag and place a thin towel between the bag of ice and your cast.   If you have a plaster or fiberglass cast:   Do not try to scratch the skin under the cast using sharp or pointed objects.   Check the skin around the cast every day. You may put lotion on any red or sore areas.   Keep your cast dry and clean.   If you have a plaster splint:   Wear the splint as directed.   You may loosen the elastic around the splint if your fingers become numb, tingle, or turn cold or blue.   If you have been put in a removable splint, wear and use as directed.   Do not use powders or deodorants in or around the cast or splint.   Do not remove padding from your cast or splint.   Do not put pressure on any part of your cast or splint. It may break. Rest your cast or splint only on a pillow the first 24 hours until it is fully hardened.   Gently move your fingers often, so they do not get stiff.   Do not remove the splint unless directed by your caregiver. Casts must be removed by an orthopedist.   Your cast or  splint can be protected during bathing with a plastic bag. Do not lower the cast or splint into water.   Only take over-the-counter or prescription medicines for pain, discomfort, or fever as directed by your caregiver.   Follow up with your caregiver as directed.  SEEK IMMEDIATE MEDICAL CARE IF:   Your cast or splint gets damaged or breaks.   Your cast or splint feels too tight or loose.   You have increased pain, not controlled with medication.   You have increased swelling.   Your skin or nails below the injury turn blue or grey or feel cold or numb.   You have trouble moving or feeling your fingers.   You experience any burning or stinging from the cast or splint.   There is a bad smell coming from under the cast or splint.   New stains or fluids are coming from under the cast or splint.   You have any new injuries while wearing the cast or splint.  Document Released: 03/14/2005 Document Revised: 02/14/2011 Document Reviewed: 01/01/2007 [employer] Patient Information 2012 [employer]. 

## 2011-05-17 NOTE — Progress Notes (Addendum)
Subjective: Patient is seen and examined ,resting comfortably ,not in distress . Reported pain when moving her right upper extremity.  Objective: Vital signs in last 24 hours: Temp:  [98.5 F (36.9 C)-99.3 F (37.4 C)] 98.5 F (36.9 C) (11/29 1354) Pulse Rate:  [73-88] 88  (11/29 1354) Resp:  [16-18] 18  (11/29 1354) BP: (118-165)/(55-74) 126/74 mmHg (11/29 1354) SpO2:  [95 %-100 %] 97 % (11/28 2231) Weight:  [48.716 kg (107 lb 6.4 oz)] 107 lb 6.4 oz (48.716 kg) (11/29 1354) Weight change:     Intake/Output from previous day: 11/28 0701 - 11/29 0700 In: 1560 [P.O.:60; I.V.:1500] Out: 850 [Urine:850] Total I/O In: 449 [P.O.:449] Out: 600 [Urine:600]   Physical Exam:  General: sleepy , in no acute distress.  Heart: Regular rate and rhythm, without murmurs, rubs, gallops.  Lungs: Clear to auscultation bilaterally.  Abdomen: Soft, nontender, nondistended, positive bowel sounds.  Extremities: No clubbing cyanosis or edema with positive pedal pulses.  Right UE dressing in place.    Lab Results: Results for orders placed during the hospital encounter of 05/14/11 (from the past 24 hour(s))  GLUCOSE, CAPILLARY     Status: Abnormal   Collection Time   05/16/11  5:07 PM      Component Value Range   Glucose-Capillary 161 (*) 70 - 99 (mg/dL)   Comment 1 Notify RN    GLUCOSE, CAPILLARY     Status: Abnormal   Collection Time   05/16/11  9:29 PM      Component Value Range   Glucose-Capillary 160 (*) 70 - 99 (mg/dL)   Comment 1 Documented in Chart     Comment 2 Notify RN    CBC     Status: Abnormal   Collection Time   05/17/11  4:00 AM      Component Value Range   WBC 22.4 (*) 4.0 - 10.5 (K/uL)   RBC 3.50 (*) 3.87 - 5.11 (MIL/uL)   Hemoglobin 9.9 (*) 12.0 - 15.0 (g/dL)   HCT 91.4 (*) 78.2 - 46.0 (%)   MCV 87.7  78.0 - 100.0 (fL)   MCH 28.3  26.0 - 34.0 (pg)   MCHC 32.2  30.0 - 36.0 (g/dL)   RDW 95.6  21.3 - 08.6 (%)   Platelets 250  150 - 400 (K/uL)  BASIC METABOLIC  PANEL     Status: Abnormal   Collection Time   05/17/11  4:00 AM      Component Value Range   Sodium 142  135 - 145 (mEq/L)   Potassium 3.8  3.5 - 5.1 (mEq/L)   Chloride 103  96 - 112 (mEq/L)   CO2 27  19 - 32 (mEq/L)   Glucose, Bld 135 (*) 70 - 99 (mg/dL)   BUN 15  6 - 23 (mg/dL)   Creatinine, Ser 5.78  0.50 - 1.10 (mg/dL)   Calcium 8.9  8.4 - 46.9 (mg/dL)   GFR calc non Af Amer 80 (*) >90 (mL/min)   GFR calc Af Amer >90  >90 (mL/min)  GLUCOSE, CAPILLARY     Status: Abnormal   Collection Time   05/17/11  7:51 AM      Component Value Range   Glucose-Capillary 137 (*) 70 - 99 (mg/dL)  GLUCOSE, CAPILLARY     Status: Abnormal   Collection Time   05/17/11 11:56 AM      Component Value Range   Glucose-Capillary 270 (*) 70 - 99 (mg/dL)    Studies/Results: No results found.  Medications:    .  ALPRAZolam  0.25 mg Oral QHS  . ampicillin-sulbactam (UNASYN) IV  1.5 g Intravenous Q6H  . benztropine  0.5 mg Oral Once  . Boost  237 mL Oral BID  . busPIRone  10 mg Oral TID  . carbidopa-levodopa  1 tablet Oral QID  . chlorhexidine  60 mL Topical Once  . cholecalciferol  2,000 Units Oral Daily  . clopidogrel  75 mg Oral Daily  . dextrose  25 mL Intravenous Once  . digoxin  125 mcg Oral Daily  . docusate sodium  100 mg Oral BID  . enoxaparin  30 mg Subcutaneous Q12H  . fenofibrate  160 mg Oral Daily  . furosemide  80 mg Oral Daily  . insulin aspart  0-9 Units Subcutaneous TID WC  . magnesium oxide  400 mg Oral Daily  . omega-3 acid ethyl esters  1 g Oral Daily  . polyethylene glycol  17 g Oral Daily  . potassium chloride SA  20 mEq Oral Daily  . QUEtiapine  25 mg Oral QHS  . vitamin A & D      . DISCONTD: enoxaparin  30 mg Subcutaneous Q12H  . DISCONTD: enoxaparin  40 mg Subcutaneous QHS    acetaminophen, acetaminophen, fentaNYL, haloperidol lactate, haloperidol lactate, HYDROcodone-acetaminophen, morphine, nitroGLYCERIN, ondansetron (ZOFRAN) IV, ondansetron, promethazine       . lactated ringers    . lactated ringers 1,000 mL (05/16/11 1042)  . lactated ringers      Assessment/Plan: 1. Fracture of right wrist:  S/P  ORIF   Pain control  2. Fall/ Parkinson's disease: continue  Pt/OT  3. A-fib: continue Asa, rate controlled on digoxin  4. DM (diabetes mellitus): SSI ,fair  5. UTI (urinary tract infection):  urine cultures grew enterococcus sp ,follow sensitivity. Will change antibiotics to levaquin 250 mg po daily for 3 doses to complete 5 days of therapy. 6. Leukocytosis: due to UTI, stress de-margination, afebrile .  7. Parkinson's disease: continue levodopa/carbidopa  8. DVt prophylaxis:lovenox  Disposition: Awaiting placement to SNF   LOS: 3 days   Niccole Witthuhn 05/17/2011, 3:17 PM

## 2011-05-17 NOTE — Progress Notes (Signed)
Pt very combative, agitated and aggressive from 2300-0400 this shift. Pt's pain was appropriately managed and haldol calmed her down. Will continue to monitor.

## 2011-05-18 LAB — GLUCOSE, CAPILLARY
Glucose-Capillary: 104 mg/dL — ABNORMAL HIGH (ref 70–99)
Glucose-Capillary: 209 mg/dL — ABNORMAL HIGH (ref 70–99)

## 2011-05-18 LAB — CBC
HCT: 28.1 % — ABNORMAL LOW (ref 36.0–46.0)
Hemoglobin: 9.4 g/dL — ABNORMAL LOW (ref 12.0–15.0)
MCH: 28.9 pg (ref 26.0–34.0)
MCHC: 33.5 g/dL (ref 30.0–36.0)
MCV: 86.5 fL (ref 78.0–100.0)

## 2011-05-18 MED ORDER — LEVOFLOXACIN 250 MG PO TABS: 250.0000 mg | ORAL_TABLET | Freq: Every day | ORAL | Status: AC

## 2011-05-18 MED ORDER — TRAMADOL HCL 50 MG PO TABS: 50.0000 mg | ORAL_TABLET | Freq: Four times a day (QID) | ORAL | Status: DC | PRN

## 2011-05-18 MED ORDER — ALPRAZOLAM 0.25 MG PO TABS: 0.2500 mg | ORAL_TABLET | Freq: Every day | ORAL | Status: DC

## 2011-05-18 MED ORDER — QUETIAPINE FUMARATE 25 MG PO TABS: 25.0000 mg | ORAL_TABLET | Freq: Every day | ORAL | Status: DC

## 2011-05-18 NOTE — Progress Notes (Signed)
Subjective: Patient seen and examined ,denies any pain   Objective: Vital signs in last 24 hours: Temp:  [98.5 F (36.9 C)-99.4 F (37.4 C)] 98.6 F (37 C) (11/30 0621) Pulse Rate:  [64-88] 64  (11/30 0621) Resp:  [16-18] 16  (11/30 0621) BP: (126-156)/(49-74) 126/49 mmHg (11/30 0621) SpO2:  [97 %-100 %] 97 % (11/30 0621) Weight:  [48.716 kg (107 lb 6.4 oz)] 107 lb 6.4 oz (48.716 kg) (11/29 1354) Weight change:     Intake/Output from previous day: 11/29 0701 - 11/30 0700 In: 449 [P.O.:449] Out: 2300 [Urine:2300]     Physical Exam: General: alert , in no acute distress.  Heart: Regular rate and rhythm, without murmurs, rubs, gallops.  Lungs: Clear to auscultation bilaterally.  Abdomen: Soft, nontender, nondistended, positive bowel sounds.  Extremities: No clubbing cyanosis or edema with positive pedal pulses.  Right UE dressing in place.     Lab Results: Results for orders placed during the hospital encounter of 05/14/11 (from the past 24 hour(s))  GLUCOSE, CAPILLARY     Status: Abnormal   Collection Time   05/17/11 11:56 AM      Component Value Range   Glucose-Capillary 270 (*) 70 - 99 (mg/dL)  GLUCOSE, CAPILLARY     Status: Abnormal   Collection Time   05/17/11  4:40 PM      Component Value Range   Glucose-Capillary 269 (*) 70 - 99 (mg/dL)   Comment 1 Notify RN    GLUCOSE, CAPILLARY     Status: Abnormal   Collection Time   05/17/11 10:11 PM      Component Value Range   Glucose-Capillary 165 (*) 70 - 99 (mg/dL)   Comment 1 Documented in Chart     Comment 2 Notify RN    CBC     Status: Abnormal   Collection Time   05/18/11  4:43 AM      Component Value Range   WBC 21.3 (*) 4.0 - 10.5 (K/uL)   RBC 3.25 (*) 3.87 - 5.11 (MIL/uL)   Hemoglobin 9.4 (*) 12.0 - 15.0 (g/dL)   HCT 91.4 (*) 78.2 - 46.0 (%)   MCV 86.5  78.0 - 100.0 (fL)   MCH 28.9  26.0 - 34.0 (pg)   MCHC 33.5  30.0 - 36.0 (g/dL)   RDW 95.6  21.3 - 08.6 (%)   Platelets 219  150 - 400 (K/uL)     Studies/Results: No results found.  Medications:    . ALPRAZolam  0.25 mg Oral QHS  . benztropine  0.5 mg Oral Once  . Boost  237 mL Oral BID  . busPIRone  10 mg Oral TID  . carbidopa-levodopa  1 tablet Oral QID  . chlorhexidine  60 mL Topical Once  . cholecalciferol  2,000 Units Oral Daily  . clopidogrel  75 mg Oral Daily  . dextrose  25 mL Intravenous Once  . digoxin  125 mcg Oral Daily  . docusate sodium  100 mg Oral BID  . enoxaparin  30 mg Subcutaneous Q12H  . fenofibrate  160 mg Oral Daily  . furosemide  80 mg Oral Daily  . insulin aspart  0-9 Units Subcutaneous TID WC  . levofloxacin  250 mg Oral Daily  . magnesium oxide  400 mg Oral Daily  . omega-3 acid ethyl esters  1 g Oral Daily  . polyethylene glycol  17 g Oral Daily  . potassium chloride SA  20 mEq Oral Daily  . QUEtiapine  25 mg  Oral QHS  . DISCONTD: ampicillin-sulbactam (UNASYN) IV  1.5 g Intravenous Q6H    fentaNYL, haloperidol lactate, HYDROcodone-acetaminophen, morphine, nitroGLYCERIN, ondansetron (ZOFRAN) IV, ondansetron, promethazine, DISCONTD: acetaminophen, DISCONTD: acetaminophen     . lactated ringers    . lactated ringers 1,000 mL (05/16/11 1042)  . lactated ringers      Assessment/Plan:  1. Fracture of right wrist:  S/P ORIF  Continue Pain control  2. Fall/ Parkinson's disease: continue Pt/OT  3. A-fib: continue Asa, rate controlled on digoxin  4. DM (diabetes mellitus): SSI ,fair  5 .Enterococcus. UTI (urinary tract infection):  Continue levaquin 250 mg po daily for 2 more  doses to complete 5 days of therapy.  6. Leukocytosis: due to UTI, stress de-margination, afebrile . Stable  7. Parkinson's disease: continue levodopa/carbidopa  8. DVt prophylaxis:lovenox  Disposition:  Awaiting placement to SNF    LOS: 4 days   April Baker 05/18/2011, 8:16 AM

## 2011-05-18 NOTE — Discharge Summary (Signed)
Patient ID: April Baker MRN: [id number] DOB/AGE: Jul 28, 1932 77 y.o.  Admit date: 05/14/2011 Discharge date: 05/18/2011  Primary Care Physician:  Nadean Corwin, MD, MD   Discharge Diagnoses:    Present on Admission:  .Fracture of right wrist Mechanical Fall .Parkinson's disease .A-fib .DM (diabetes mellitus) *Enterococcus UTI (urinary tract infection) *postoperative Urinary retention s/p foley catheterization.  Current Discharge Medication List    START taking these medications   Details  levofloxacin (LEVAQUIN) 250 MG tablet Take 1 tablet (250 mg total) by mouth daily. Qty: 3 tablet, Refills: 0      CONTINUE these medications which have CHANGED   Details  traMADol (ULTRAM) 50 MG tablet Take 1 tablet (50 mg total) by mouth every 6 (six) hours as needed. Maximum dose= 8 tablets per day. pain Qty: 30 tablet, Refills: 0      CONTINUE these medications which have NOT CHANGED   Details  ALPRAZolam (XANAX) 0.25 MG tablet Take 0.25 mg by mouth at bedtime.      Boost (BOOST) LIQD Take 237 mLs by mouth 2 (two) times daily.      busPIRone (BUSPAR) 10 MG tablet Take 10 mg by mouth 3 (three) times daily.      carbidopa-levodopa (SINEMET) 25-100 MG per tablet Take 1 tablet by mouth 4 (four) times daily.      cholecalciferol (VITAMIN D) 1000 UNITS tablet Take 2,000 Units by mouth daily.      clopidogrel (PLAVIX) 75 MG tablet Take 75 mg by mouth daily.      digoxin (LANOXIN) 0.125 MG tablet Take 125 mcg by mouth daily.      docusate sodium (COLACE) 100 MG capsule Take 100 mg by mouth 2 (two) times daily.      fenofibrate micronized (LOFIBRA) 134 MG capsule Take 134 mg by mouth daily before breakfast.      fish oil-omega-3 fatty acids 1000 MG capsule Take 1 g by mouth daily.      Flaxseed, Linseed, (FLAX SEED OIL) 1000 MG CAPS Take 1 capsule by mouth daily.      furosemide (LASIX) 80 MG tablet Take 80 mg by mouth daily.      Lutein 20 MG TABS Take 1 tablet by  mouth daily.      magnesium oxide (MAG-OX) 400 MG tablet Take 400 mg by mouth daily.      nitroGLYCERIN (NITROSTAT) 0.4 MG SL tablet Place 0.4 mg under the tongue every 5 (five) minutes as needed. Chest pain     polyethylene glycol (MIRALAX / GLYCOLAX) packet Take 17 g by mouth daily.      potassium chloride SA (K-DUR,KLOR-CON) 20 MEQ tablet Take 20 mEq by mouth daily.      QUEtiapine (SEROQUEL) 25 MG tablet Take 25 mg by mouth at bedtime.      sitaGLIPtin (JANUVIA) 50 MG tablet Take 50 mg by mouth daily.           Consults:   ortopedics  (Dr Mina Marble) Procedure: ORIF  Right radius/ulnar fracture 05/16/11  Significant Diagnostic Studies:  Dg Chest 2 View  05/14/2011  *RADIOLOGY REPORT*  Clinical Data: Wrist .  IMPRESSION: Postoperative findings.  No acute chest process.  Original Report Authenticated By: Judie Petit. Ruel Favors, M.D.   Dg Wrist Complete Right  05/14/2011  *RADIOLOGY REPORT*  Clinical Data: .  IMPRESSION: Acute displaced fractures of the right distal radius and ulna with associated subluxation.  Per CMS PQRS reporting requirements (PQRS Measure 24): Given the patient's age of greater than 21  and the fracture site (hip, distal radius, or spine), the patient should be tested for osteoporosis using DXA, and the appropriate treatment considered based on the DXA results.  Original Report Authenticated By: Judie Petit. Ruel Favors, M.D.    Brief H and P: For complete details please refer to admission H and P, but in brief  April Baker is an 77 y.o. female with history of Parkinson's disease, frequent fall, hypertension, atrial fibrillation not on anticoagulation, adult-onset diabetes on Januvia, known coronary disease status post prior sternotomy, fell yesterday and hit her head without significant injury, had another mechanical fall today and broke her right wrist. She was seen in consultation with orthopedics and her right wrist was placed on a cast. She is a resident of assisted  living and cannot go back to her place because she is unable to care for herself. For this reason, hospitalist was asked to admit patient for rehabilitation placement. Workup in the emergency room also showed that she has leukocytosis with a white count 23,000, and a urinalysis consistent with a persistent infection. She had been on Keflex for prior UTI. Patient herself is confused and although she is able to tell where she lives, and who she has, she did not recall the event of this morning.   Hospital Course:  1. Fracture of right wrist:  S/P ORIF  Follow with Dr Mina Marble in one week ,as per him not to change the dressing . 2. S/P Mechanical Fall: continue Pt/OT  3. A-fib: continue Asa, rate controlled on digoxin  4. DM (diabetes mellitus):  Was on SSI as inpatient  ,CBGs were fair ,will resume Januvia on D/C .monitor CBG. 5 .Enterococcus. UTI (urinary tract infection): Continue levaquin 250 mg po daily for 3 more doses to complete 7 days of therapy. Was treated with Unaysn for 2 days then switched to po levaquin . 6. Leukocytosis: due to UTI, stress de-margination, afebrile . Stable ,continue to monitor as outpatient. 7. Parkinson's disease: continue levodopa/carbidopa  8.Postopertive urinary retention: S/P foley ,voiding trial failed ,will keep the foley in on D/C ,continue to monitor as outpatient and attempt voiding trial ,if failed will need urology evaluation,defer to rounding physician at SNF and PCP.  Subjective:  Patient seen and examined ,denies any pain   Filed Vitals:   05/18/11 0621  BP: 126/49  Pulse: 64  Temp: 98.6 F (37 C)  Resp: 16    General: alert , in no acute distress.  Heart: Regular rate and rhythm, without murmurs, rubs, gallops.  Lungs: Clear to auscultation bilaterally.  Abdomen: Soft, nontender, nondistended, positive bowel sounds.  Extremities: No clubbing cyanosis or edema with positive pedal pulses.  Right UE dressing in place.    Disposition and  Follow-up:  To SNF  Make an appoinment with Dr Mina Marble in 1 week and with PCP in 1-2 weeks    Time spent on Discharge: 50 minutes    Signed: Rosealynn Mateus 05/18/2011, 9:38 AM

## 2011-05-18 NOTE — Progress Notes (Signed)
Patient cleared for d/c. Patient accepted at [facility] and patient and patients daughter are agreeable to same. Ambulance transport set up through [facility]. Iyah Laguna C. Moana Munford MSW, LCSW [phone]

## 2011-05-18 NOTE — Progress Notes (Signed)
Patient stable from AM assessment. SW contacted and transportation called for patient to be transported to [facility].

## 2011-10-22 ENCOUNTER — Ambulatory Visit: Payer: Medicare Other | Attending: Neurology | Admitting: Physical Therapy

## 2011-12-27 ENCOUNTER — Observation Stay (HOSPITAL_COMMUNITY)
Admission: EM | Admit: 2011-12-27 | Discharge: 2012-01-01 | Payer: Medicare Other | Source: Ambulatory Visit | Attending: Family Medicine | Admitting: Family Medicine

## 2011-12-27 ENCOUNTER — Encounter (HOSPITAL_COMMUNITY): Payer: Self-pay | Admitting: Emergency Medicine

## 2011-12-27 DIAGNOSIS — I10 Essential (primary) hypertension: Secondary | ICD-10-CM | POA: Insufficient documentation

## 2011-12-27 DIAGNOSIS — G20A1 Parkinson's disease without dyskinesia, without mention of fluctuations: Secondary | ICD-10-CM

## 2011-12-27 DIAGNOSIS — I519 Heart disease, unspecified: Secondary | ICD-10-CM | POA: Insufficient documentation

## 2011-12-27 DIAGNOSIS — F0280 Dementia in other diseases classified elsewhere without behavioral disturbance: Secondary | ICD-10-CM | POA: Insufficient documentation

## 2011-12-27 DIAGNOSIS — R079 Chest pain, unspecified: Secondary | ICD-10-CM

## 2011-12-27 DIAGNOSIS — G3183 Dementia with Lewy bodies: Secondary | ICD-10-CM | POA: Insufficient documentation

## 2011-12-27 DIAGNOSIS — F0390 Unspecified dementia without behavioral disturbance: Secondary | ICD-10-CM

## 2011-12-27 DIAGNOSIS — R911 Solitary pulmonary nodule: Secondary | ICD-10-CM | POA: Insufficient documentation

## 2011-12-27 DIAGNOSIS — M25539 Pain in unspecified wrist: Secondary | ICD-10-CM | POA: Insufficient documentation

## 2011-12-27 DIAGNOSIS — I2510 Atherosclerotic heart disease of native coronary artery without angina pectoris: Secondary | ICD-10-CM | POA: Insufficient documentation

## 2011-12-27 DIAGNOSIS — D72829 Elevated white blood cell count, unspecified: Secondary | ICD-10-CM

## 2011-12-27 DIAGNOSIS — R0789 Other chest pain: Principal | ICD-10-CM | POA: Insufficient documentation

## 2011-12-27 DIAGNOSIS — E119 Type 2 diabetes mellitus without complications: Secondary | ICD-10-CM

## 2011-12-27 DIAGNOSIS — Z951 Presence of aortocoronary bypass graft: Secondary | ICD-10-CM | POA: Insufficient documentation

## 2011-12-27 DIAGNOSIS — I4891 Unspecified atrial fibrillation: Secondary | ICD-10-CM

## 2011-12-27 DIAGNOSIS — G20 Parkinson's disease: Secondary | ICD-10-CM | POA: Diagnosis present

## 2011-12-27 NOTE — ED Notes (Signed)
PT. ARRIVED WITH EMS FROM [facility] , STAFF REPORTS PT. STATES SUBSTERNAL CHEST PAIN THIS EVENING , RECEIVED 4 BABY ASA AND 1 NTG SL PRIOR TO ARRIVAL WITH SLIGHT RELIEF.  DENIES SOB OR NAUSEA. NO COUGH OR CONGESTION . PT. HAS DNR ( YELLOW FORM ) ORDER.

## 2011-12-28 ENCOUNTER — Emergency Department (HOSPITAL_COMMUNITY): Payer: Medicare Other

## 2011-12-28 ENCOUNTER — Observation Stay (HOSPITAL_COMMUNITY): Payer: Medicare Other

## 2011-12-28 ENCOUNTER — Encounter (HOSPITAL_COMMUNITY): Payer: Self-pay | Admitting: Internal Medicine

## 2011-12-28 DIAGNOSIS — D72829 Elevated white blood cell count, unspecified: Secondary | ICD-10-CM | POA: Diagnosis present

## 2011-12-28 DIAGNOSIS — G20 Parkinson's disease: Secondary | ICD-10-CM | POA: Diagnosis present

## 2011-12-28 DIAGNOSIS — F0390 Unspecified dementia without behavioral disturbance: Secondary | ICD-10-CM | POA: Diagnosis present

## 2011-12-28 DIAGNOSIS — R079 Chest pain, unspecified: Secondary | ICD-10-CM | POA: Diagnosis present

## 2011-12-28 LAB — URINALYSIS, ROUTINE W REFLEX MICROSCOPIC
Bilirubin Urine: NEGATIVE
Glucose, UA: 500 mg/dL — AB
Ketones, ur: NEGATIVE mg/dL
Leukocytes, UA: NEGATIVE
Specific Gravity, Urine: 1.015 (ref 1.005–1.030)
pH: 6.5 (ref 5.0–8.0)

## 2011-12-28 LAB — GLUCOSE, CAPILLARY
Glucose-Capillary: 182 mg/dL — ABNORMAL HIGH (ref 70–99)
Glucose-Capillary: 235 mg/dL — ABNORMAL HIGH (ref 70–99)

## 2011-12-28 LAB — CBC WITH DIFFERENTIAL/PLATELET
Basophils Absolute: 0 10*3/uL (ref 0.0–0.1)
Basophils Absolute: 0 10*3/uL (ref 0.0–0.1)
Eosinophils Absolute: 0.3 10*3/uL (ref 0.0–0.7)
Eosinophils Relative: 1 % (ref 0–5)
Lymphocytes Relative: 82 % — ABNORMAL HIGH (ref 12–46)
Lymphs Abs: 17.8 10*3/uL — ABNORMAL HIGH (ref 0.7–4.0)
MCH: 28 pg (ref 26.0–34.0)
MCHC: 33.4 g/dL (ref 30.0–36.0)
Monocytes Absolute: 0.5 10*3/uL (ref 0.1–1.0)
Monocytes Relative: 3 % (ref 3–12)
Neutrophils Relative %: 14 % — ABNORMAL LOW (ref 43–77)
Neutrophils Relative %: 14 % — ABNORMAL LOW (ref 43–77)
Platelets: 222 10*3/uL (ref 150–400)
Platelets: 210 10*3/uL (ref 150–400)
RBC: 3.96 MIL/uL (ref 3.87–5.11)
RDW: 13.5 % (ref 11.5–15.5)
RDW: 13.6 % (ref 11.5–15.5)
WBC: 21.6 10*3/uL — ABNORMAL HIGH (ref 4.0–10.5)

## 2011-12-28 LAB — CARDIAC PANEL(CRET KIN+CKTOT+MB+TROPI)
CK, MB: 1.8 ng/mL (ref 0.3–4.0)
Relative Index: INVALID (ref 0.0–2.5)
Relative Index: INVALID (ref 0.0–2.5)
Total CK: 31 U/L (ref 7–177)
Total CK: 24 U/L (ref 7–177)
Troponin I: <0.3 ng/mL (ref <0.30)

## 2011-12-28 LAB — PROTIME-INR
INR: 1.06 (ref 0.00–1.49)
Prothrombin Time: 14 seconds (ref 11.6–15.2)

## 2011-12-28 LAB — D-DIMER, QUANTITATIVE: D-Dimer, Quant: 3 ug/mL-FEU — ABNORMAL HIGH (ref 0.00–0.48)

## 2011-12-28 LAB — BASIC METABOLIC PANEL
Calcium: 9.3 mg/dL (ref 8.4–10.5)
Creatinine, Ser: 1.03 mg/dL (ref 0.50–1.10)
GFR calc Af Amer: 59 mL/min — ABNORMAL LOW (ref >90)
GFR calc non Af Amer: 51 mL/min — ABNORMAL LOW (ref >90)
Sodium: 137 mEq/L (ref 135–145)

## 2011-12-28 MED ORDER — IOHEXOL 350 MG/ML SOLN
100.0000 mL | Freq: Once | INTRAVENOUS | Status: AC | PRN
  Administered 2011-12-28: 100 mL via INTRAVENOUS

## 2011-12-28 MED ORDER — DIGOXIN 125 MCG PO TABS
125.0000 ug | ORAL_TABLET | Freq: Every day | ORAL | Status: DC
  Administered 2011-12-28 – 2011-12-31 (×4): 125 ug via ORAL
  Filled 2011-12-28 (×5): qty 1

## 2011-12-28 MED ORDER — INSULIN ASPART 100 UNIT/ML ~~LOC~~ SOLN
0.0000 [IU] | Freq: Three times a day (TID) | SUBCUTANEOUS | Status: DC
  Administered 2011-12-28: 5 [IU] via SUBCUTANEOUS
  Administered 2011-12-28 (×2): 2 [IU] via SUBCUTANEOUS
  Administered 2011-12-29: 7 [IU] via SUBCUTANEOUS
  Administered 2011-12-29: 3 [IU] via SUBCUTANEOUS
  Administered 2011-12-30: 9 [IU] via SUBCUTANEOUS
  Administered 2011-12-30: 2 [IU] via SUBCUTANEOUS
  Administered 2011-12-31: 7 [IU] via SUBCUTANEOUS
  Administered 2011-12-31: 5 [IU] via SUBCUTANEOUS

## 2011-12-28 MED ORDER — BUSPIRONE HCL 10 MG PO TABS
10.0000 mg | ORAL_TABLET | Freq: Three times a day (TID) | ORAL | Status: DC
  Administered 2011-12-28 – 2011-12-31 (×11): 10 mg via ORAL
  Filled 2011-12-28 (×15): qty 1

## 2011-12-28 MED ORDER — DOCUSATE SODIUM 100 MG PO CAPS
100.0000 mg | ORAL_CAPSULE | Freq: Two times a day (BID) | ORAL | Status: DC
  Administered 2011-12-28 – 2011-12-31 (×6): 100 mg via ORAL
  Filled 2011-12-28 (×11): qty 1

## 2011-12-28 MED ORDER — SODIUM CHLORIDE 0.9 % IJ SOLN
3.0000 mL | Freq: Two times a day (BID) | INTRAMUSCULAR | Status: DC
  Administered 2011-12-28 – 2011-12-31 (×7): 3 mL via INTRAVENOUS

## 2011-12-28 MED ORDER — QUETIAPINE FUMARATE 25 MG PO TABS
25.0000 mg | ORAL_TABLET | Freq: Two times a day (BID) | ORAL | Status: DC
  Administered 2011-12-28 – 2011-12-31 (×7): 25 mg via ORAL
  Filled 2011-12-28 (×10): qty 1

## 2011-12-28 MED ORDER — CLOPIDOGREL BISULFATE 75 MG PO TABS
75.0000 mg | ORAL_TABLET | Freq: Every day | ORAL | Status: DC
  Administered 2011-12-28 – 2011-12-31 (×4): 75 mg via ORAL
  Filled 2011-12-28 (×5): qty 1

## 2011-12-28 MED ORDER — MAGNESIUM OXIDE 400 MG PO TABS
400.0000 mg | ORAL_TABLET | Freq: Every day | ORAL | Status: DC
  Administered 2011-12-28 – 2011-12-31 (×4): 400 mg via ORAL
  Filled 2011-12-28 (×5): qty 1

## 2011-12-28 MED ORDER — CARBIDOPA-LEVODOPA 25-100 MG PO TABS
1.0000 | ORAL_TABLET | Freq: Four times a day (QID) | ORAL | Status: DC
  Administered 2011-12-28 – 2011-12-31 (×15): 1 via ORAL
  Filled 2011-12-28 (×20): qty 1

## 2011-12-28 MED ORDER — NITROGLYCERIN 0.4 MG SL SUBL: 0.4000 mg | SUBLINGUAL_TABLET | SUBLINGUAL | Status: DC | PRN

## 2011-12-28 MED ORDER — TRAMADOL HCL 50 MG PO TABS: 50.0000 mg | ORAL_TABLET | Freq: Four times a day (QID) | ORAL | Status: DC | PRN

## 2011-12-28 MED ORDER — POTASSIUM CHLORIDE CRYS ER 20 MEQ PO TBCR
20.0000 meq | EXTENDED_RELEASE_TABLET | Freq: Every day | ORAL | Status: DC
  Administered 2011-12-28 – 2011-12-31 (×4): 20 meq via ORAL
  Filled 2011-12-28 (×5): qty 1

## 2011-12-28 MED ORDER — FENOFIBRATE 160 MG PO TABS
160.0000 mg | ORAL_TABLET | Freq: Every day | ORAL | Status: DC
  Administered 2011-12-28 – 2011-12-31 (×4): 160 mg via ORAL
  Filled 2011-12-28 (×5): qty 1

## 2011-12-28 MED ORDER — ENOXAPARIN SODIUM 30 MG/0.3ML ~~LOC~~ SOLN
30.0000 mg | SUBCUTANEOUS | Status: DC
  Administered 2011-12-28 – 2011-12-31 (×3): 30 mg via SUBCUTANEOUS
  Filled 2011-12-28 (×7): qty 0.3

## 2011-12-28 MED ORDER — ASPIRIN 325 MG PO TABS
325.0000 mg | ORAL_TABLET | Freq: Every day | ORAL | Status: DC
  Administered 2011-12-28 – 2011-12-31 (×4): 325 mg via ORAL
  Filled 2011-12-28 (×5): qty 1

## 2011-12-28 MED ORDER — ACETAMINOPHEN 325 MG PO TABS: 650.0000 mg | ORAL_TABLET | Freq: Four times a day (QID) | ORAL | Status: DC | PRN

## 2011-12-28 MED ORDER — FUROSEMIDE 80 MG PO TABS
80.0000 mg | ORAL_TABLET | Freq: Two times a day (BID) | ORAL | Status: DC
  Administered 2011-12-28 – 2011-12-31 (×6): 80 mg via ORAL
  Filled 2011-12-28 (×10): qty 1

## 2011-12-28 MED ORDER — POLYETHYLENE GLYCOL 3350 17 G PO PACK
17.0000 g | PACK | Freq: Every day | ORAL | Status: DC
  Administered 2011-12-28 – 2011-12-31 (×3): 17 g via ORAL
  Filled 2011-12-28 (×5): qty 1

## 2011-12-28 MED ORDER — ALPRAZOLAM 0.25 MG PO TABS
0.2500 mg | ORAL_TABLET | Freq: Every day | ORAL | Status: DC
  Administered 2011-12-28 – 2011-12-31 (×3): 0.25 mg via ORAL
  Filled 2011-12-28 (×4): qty 1

## 2011-12-28 MED ORDER — SODIUM CHLORIDE 0.9 % IV SOLN
INTRAVENOUS | Status: AC
  Administered 2011-12-28: 03:00:00 via INTRAVENOUS

## 2011-12-28 MED ORDER — ACETAMINOPHEN 650 MG RE SUPP: 650.0000 mg | Freq: Four times a day (QID) | RECTAL | Status: DC | PRN

## 2011-12-28 NOTE — Clinical Social Work Psychosocial (Signed)
     Clinical Social Work Department BRIEF PSYCHOSOCIAL ASSESSMENT 12/28/2011  Patient:  April Baker, April Baker     Account Number:  [id number]     Admit date:  12/27/2011  Clinical Social Worker:  Robin Searing  Date/Time:  12/28/2011 12:47 PM  Referred by:  Physician  Date Referred:  12/28/2011 Referred for  ALF Placement   Other Referral:   Interview type:  Family Other interview type:   DaughterMinda Meo [phone]    PSYCHOSOCIAL DATA Living Status:  FACILITY Admitted from facility:  OTHER Level of care:  Assisted Living Primary support name:  daughter Primary support relationship to patient:  FAMILY Degree of support available:   good    CURRENT CONCERNS Current Concerns  Post-Acute Placement   Other Concerns:    SOCIAL WORK ASSESSMENT / PLAN Patient admitted from [facility]- anticipate return at d/c-  the ALF cannot accept patient back on the weekend- will tentatively plan for d/c Monday if stable-  PT ordered   Assessment/plan status:  Other - See comment Other assessment/ plan:   Information/referral to community resources:   ALF, [facility], [facility]    PATIENTS/FAMILYS RESPONSE TO PLAN OF CARE: Daughter and ALF staff agreeable to plans as above.

## 2011-12-28 NOTE — Progress Notes (Signed)
78 year old woman arrived the EMS from nursing home for substernal chest pain.  She was admitted for atypical chest pain, serial cardiac enzymes. D-dimer was positive but CT of chest was negative for pulmonary embolism.  Past medical history includes atrial fibrillation, myocardial infarction, diabetes, carotid artery stenosis, Parkinson's disease, dementia, chronic leukocytosis.  Afebrile, vital signs stable. Cardiac enzymes negative thus far.EKG EKG shows sinus rhythm with anterior T-wave inversions, no significant change since last test 05/14/2011.  Patient will need outpatient followup for her chronic leukocytosis and consider repeat imaging for pulmonary nodule and followup.  If rules out would anticipate discharge 7/13. Would not pursue any further cardiac workup unless cardiac enzymes positive for check of significantly abnormal.  Agree with exam, assessment and plan as outlined.

## 2011-12-28 NOTE — ED Provider Notes (Signed)
History     CSN: [id number]  Arrival date & time 12/27/11  2344   First MD Initiated Contact with Patient 12/28/11 0055      Chief Complaint  Patient presents with  . Chest Pain    (Consider location/radiation/quality/duration/timing/severity/associated sxs/prior treatment) HPI Level 5 Caveat: dementia. See HPI below.  Past Medical History  Diagnosis Date  . Hypertension   . Atrial fibrillation   . Parkinson's disease   . Myocardial infarction   . Coronary artery disease   . Hyperlipidemia   . Dementia   . Diabetes mellitus   . Anxiety   . MVP (mitral valve prolapse)     History of  . Unilateral carotid artery stenosis   . Wrist fracture, right   . Peripheral neuropathy   . Lower extremity numbness     Past Surgical History  Procedure Date  . Inner ear surgery 1985    with metal placement-no mri  . Cesarean section 1964  . Wrist reconstruction 1991  . Anal fistulotomy 08/12/2000  . Coronary artery bypass graft   . Cardiac catheterization   . Sternal wire removal 10/29/2003    I&D of 2 upper sternal wires    History reviewed. No pertinent family history.  History  Substance Use Topics  . Smoking status: Never Smoker   . Smokeless tobacco: Never Used  . Alcohol Use: No    OB History    Grav Para Term Preterm Abortions TAB SAB Ect Mult Living                  Review of Systems  Unable to perform ROS   Allergies  Latex  Home Medications   Current Outpatient Rx  Name Route Sig Dispense Refill  . ALPRAZOLAM 0.25 MG PO TABS Oral Take 0.25 mg by mouth at bedtime.    . BUSPIRONE HCL 10 MG PO TABS Oral Take 10 mg by mouth 3 (three) times daily.      Marland Kitchen CARBIDOPA-LEVODOPA 25-100 MG PO TABS Oral Take 1 tablet by mouth 4 (four) times daily. 8 am, 12 pm, 4 pm, 9 pm    . VITAMIN D 1000 UNITS PO TABS Oral Take 2,000 Units by mouth daily.      Marland Kitchen CLOPIDOGREL BISULFATE 75 MG PO TABS Oral Take 75 mg by mouth daily.      Marland Kitchen DIGOXIN 0.125 MG PO TABS Oral Take  125 mcg by mouth daily.      Marland Kitchen DOCUSATE SODIUM 100 MG PO CAPS Oral Take 100 mg by mouth 2 (two) times daily.      . FENOFIBRATE MICRONIZED 134 MG PO CAPS Oral Take 134 mg by mouth daily before breakfast.      . OMEGA-3 FATTY ACIDS 1000 MG PO CAPS Oral Take 1 g by mouth daily.      Marland Kitchen FLAX SEED OIL 1000 MG PO CAPS Oral Take 1 capsule by mouth daily.      . FUROSEMIDE 80 MG PO TABS Oral Take 80 mg by mouth 2 (two) times daily.     . LUTEIN 20 MG PO TABS Oral Take 1 tablet by mouth daily.      Marland Kitchen MAGNESIUM OXIDE 400 MG PO TABS Oral Take 400 mg by mouth daily.      Marland Kitchen NITROGLYCERIN 0.4 MG SL SUBL Sublingual Place 0.4 mg under the tongue every 5 (five) minutes as needed. Chest pain     . POLYETHYLENE GLYCOL 3350 PO PACK Oral Take 17 g by mouth  daily.      Marland Kitchen POTASSIUM CHLORIDE CRYS ER 20 MEQ PO TBCR Oral Take 20 mEq by mouth daily.      . QUETIAPINE FUMARATE 25 MG PO TABS Oral Take 25 mg by mouth 2 (two) times daily.    Marland Kitchen SITAGLIPTIN PHOSPHATE 50 MG PO TABS Oral Take 50 mg by mouth daily.      . TRAMADOL HCL 50 MG PO TABS Oral Take 1 tablet (50 mg total) by mouth every 6 (six) hours as needed. Maximum dose= 8 tablets per day. pain 30 tablet 0    BP 110/70  Pulse 62  Temp 97.7 F (36.5 C) (Oral)  Resp 15  SpO2 100%  Physical Exam  HPI This is a 78 year old white female from a nursing home. She has a history of dementia. She was sent do to complaints of chest pain that began sometime prior to arrival. The only thing the patient can say is that it was "terrible". She denies chest pain at this time. The words no reported dyspnea or nausea, cough or congestion. She has been somnolent but arousable which is reportedly her baseline. She states she cannot open her eyes, and this is a common occurrence for her.  Physical Exam General: Well-developed, well-nourished female in no acute distress; appearance consistent with age of record HENT: normocephalic, atraumatic Eyes: pupils equal round and reactive  to light; will not open eyes voluntarily Neck: supple Heart: regular rate and rhythm Lungs: clear to auscultation bilaterally Abdomen: soft; nondistended; nontender; bowel sounds present Extremities: No deformity; full range of motion; pulses normal; trace edema of lower legs Neurologic: Somnolent but arousable; will not open eyes; limited exam due to poor effort Skin: Warm and dry     ED Course  Procedures (including critical care time)     MDM   Nursing notes and vitals signs, including pulse oximetry, reviewed.  Summary of this visit's results, reviewed by myself:  Labs:  Results for orders placed during the hospital encounter of 12/27/11  CBC WITH DIFFERENTIAL      Component Value Range   WBC 26.7 (*) 4.0 - 10.5 K/uL   RBC 3.96  3.87 - 5.11 MIL/uL   Hemoglobin 11.1 (*) 12.0 - 15.0 g/dL   HCT 16.1 (*) 09.6 - 04.5 %   MCV 83.8  78.0 - 100.0 fL   MCH 28.0  26.0 - 34.0 pg   MCHC 33.4  30.0 - 36.0 g/dL   RDW 40.9  81.1 - 91.4 %   Platelets 222  150 - 400 K/uL   Neutrophils Relative 14 (*) 43 - 77 %   Lymphocytes Relative 83 (*) 12 - 46 %   Monocytes Relative 2 (*) 3 - 12 %   Eosinophils Relative 1  0 - 5 %   Basophils Relative 0  0 - 1 %   Neutro Abs 3.7  1.7 - 7.7 K/uL   Lymphs Abs 22.2 (*) 0.7 - 4.0 K/uL   Monocytes Absolute 0.5  0.1 - 1.0 K/uL   Eosinophils Absolute 0.3  0.0 - 0.7 K/uL   Basophils Absolute 0.0  0.0 - 0.1 K/uL   WBC Morphology ATYPICAL LYMPHOCYTES    BASIC METABOLIC PANEL      Component Value Range   Sodium 137  135 - 145 mEq/L   Potassium 3.5  3.5 - 5.1 mEq/L   Chloride 96  96 - 112 mEq/L   CO2 31  19 - 32 mEq/L   Glucose, Bld 219 (*)  70 - 99 mg/dL   BUN 26 (*) 6 - 23 mg/dL   Creatinine, Ser 1.61  0.50 - 1.10 mg/dL   Calcium 9.3  8.4 - 09.6 mg/dL   GFR calc non Af Amer 51 (*) >90 mL/min   GFR calc Af Amer 59 (*) >90 mL/min  PROTIME-INR      Component Value Range   Prothrombin Time 14.0  11.6 - 15.2 seconds   INR 1.06  0.00 - 1.49    POCT I-STAT TROPONIN I      Component Value Range   Troponin i, poc 0.02  0.00 - 0.08 ng/mL   Comment 3           DIGOXIN LEVEL      Component Value Range   Digoxin Level 1.0  0.8 - 2.0 ng/mL  URINALYSIS, ROUTINE W REFLEX MICROSCOPIC      Component Value Range   Color, Urine YELLOW  YELLOW   APPearance CLEAR  CLEAR   Specific Gravity, Urine 1.015  1.005 - 1.030   pH 6.5  5.0 - 8.0   Glucose, UA 500 (*) NEGATIVE mg/dL   Hgb urine dipstick NEGATIVE  NEGATIVE   Bilirubin Urine NEGATIVE  NEGATIVE   Ketones, ur NEGATIVE  NEGATIVE mg/dL   Protein, ur NEGATIVE  NEGATIVE mg/dL   Urobilinogen, UA 0.2  0.0 - 1.0 mg/dL   Nitrite NEGATIVE  NEGATIVE   Leukocytes, UA NEGATIVE  NEGATIVE  CBC WITH DIFFERENTIAL      Component Value Range   WBC 21.6 (*) 4.0 - 10.5 K/uL   RBC 3.87  3.87 - 5.11 MIL/uL   Hemoglobin 10.8 (*) 12.0 - 15.0 g/dL   HCT 04.5 (*) 40.9 - 81.1 %   MCV 83.7  78.0 - 100.0 fL   MCH 27.9  26.0 - 34.0 pg   MCHC 33.3  30.0 - 36.0 g/dL   RDW 91.4  78.2 - 95.6 %   Platelets 210  150 - 400 K/uL   Neutrophils Relative PENDING  43 - 77 %   Neutro Abs PENDING  1.7 - 7.7 K/uL   Band Neutrophils PENDING  0 - 10 %   Lymphocytes Relative PENDING  12 - 46 %   Lymphs Abs PENDING  0.7 - 4.0 K/uL   Monocytes Relative PENDING  3 - 12 %   Monocytes Absolute PENDING  0.1 - 1.0 K/uL   Eosinophils Relative PENDING  0 - 5 %   Eosinophils Absolute PENDING  0.0 - 0.7 K/uL   Basophils Relative PENDING  0 - 1 %   Basophils Absolute PENDING  0.0 - 0.1 K/uL   WBC Morphology PENDING     RBC Morphology PENDING     Smear Review PENDING     nRBC PENDING  0 /100 WBC   Metamyelocytes Relative PENDING     Myelocytes PENDING     Promyelocytes Absolute PENDING     Blasts PENDING    D-DIMER, QUANTITATIVE      Component Value Range   D-Dimer, Quant 3.00 (*) 0.00 - 0.48 ug/mL-FEU    Imaging Studies: Dg Chest 2 View  12/28/2011  *RADIOLOGY REPORT*  Clinical Data: Chest pain  CHEST - 2 VIEW   Comparison: 05/14/2011  Findings: Moderate cardiomegaly.  Normal vascularity.  Postop changes.  No pleural effusion.  No pneumothorax.  No consolidation.  IMPRESSION: Cardiomegaly without edema.  Original Report Authenticated By: Donavan Burnet, M.D.   EKG Interpretation:  Date & Time: 12/28/2011 1:32 AM  Rate: 58  Rhythm: sinus bradycardia  QRS Axis: normal  Intervals: normal  ST/T Wave abnormalities: Inferior and anterior T-wave inversions  Conduction Disutrbances:Borderline AV conduction delay  Narrative Interpretation:   Old EKG Reviewed: Poor data quality limits comparison but anterior T-wave inversions were present previously            Hanley Seamen, MD 12/28/11 [phone]

## 2011-12-28 NOTE — Progress Notes (Signed)
  Echocardiogram 2D Echocardiogram has been performed.  Georgian Co 12/28/2011, 5:04 PM

## 2011-12-28 NOTE — Progress Notes (Signed)
Spoke with patient's daughter Morley Kos [phone])[phone] phone- [facility] and will plan return at d/c- the ALF is unable to accept patient's on the weekend- noted PT consulted and we await that eval/recommendations for d/c planning. Will ask covering CSW to complete FL2 for ALF. Aundra Dubin at ALF- [phone].  Reece Levy, MSW, Theresia Majors [phone]

## 2011-12-28 NOTE — ED Notes (Signed)
[facility] TEL. [phone].

## 2011-12-28 NOTE — Care Management Note (Addendum)
    Page 1 of 1   12/28/2011     11:04:33 AM   CARE MANAGEMENT NOTE 12/28/2011  Patient:  ANAROSA, KUBISIAK   Account Number:  [id number]  Date Initiated:  12/28/2011  Documentation initiated by:  Junius Creamer  Subjective/Objective Assessment:   adm w ch pain     Action/Plan:   from alf, sw ref, pct dr Lucky Cowboy   Anticipated DC Date:     Anticipated DC Plan:  ASSISTED LIVING / REST HOME  In-house referral  Clinical Social Worker      DC Planning Services  CM consult      Choice offered to / List presented to:             Status of service:   Medicare Important Message given?   (If response is "NO", the following Medicare IM given date fields will be blank) Date Medicare IM given:   Date Additional Medicare IM given:    Discharge Disposition:  ASSISTED LIVING  Per UR Regulation:  Reviewed for med. necessity/level of care/duration of stay  If discussed at Long Length of Stay Meetings, dates discussed:    Comments:  7/12 9:52a debbie Karuna Balducci rn,bsn [phone]

## 2011-12-28 NOTE — Progress Notes (Signed)
Physical Therapy Cancellation Note: order received, chart reviewed, unable to evaluate pt at this time due to test (echo) and unable to participate. Will attempt next date. Thanks Delaney Meigs, PT [phone]

## 2011-12-28 NOTE — ED Notes (Signed)
April Baker TEL. [phone]. DAUGHTER .

## 2011-12-28 NOTE — H&P (Addendum)
PCP:   Nadean Corwin, MD   Chief Complaint:  Chest pain  HPI: [name] is a pleasant 78 year old female from [facility] with history of Parkinson's  disease, dementia, diabetes, CABG/PCI, hypertension presents to the emergency room with the above-mentioned complaint. Patient is a very poor historian and says that she started experiencing severe chest pain substernal in location, constant, unable to describe the nature, nonradiating, subsequently EMS was called on route was given 4 baby aspirin with slight improvement in her symptoms, currently her chest pain has resolved but does still feel quite weak. She denies any cough congestion shortness of breath. Denies orthopnea PND lower extremity edema Denies any fevers or chills She does report occasional heartburn  Allergies:   Allergies  Allergen Reactions  . Latex Itching      Past Medical History  Diagnosis Date  . Hypertension   . Atrial fibrillation   . Parkinson's disease   . Myocardial infarction   . Coronary artery disease   . Hyperlipidemia   . Dementia   . Diabetes mellitus   . Anxiety   . MVP (mitral valve prolapse)     History of  . Unilateral carotid artery stenosis   . Wrist fracture, right   . Peripheral neuropathy   . Lower extremity numbness     Past Surgical History  Procedure Date  . Inner ear surgery 1985    with metal placement-no mri  . Cesarean section 1964  . Wrist reconstruction 1991  . Anal fistulotomy 08/12/2000  . Coronary artery bypass graft   . Cardiac catheterization   . Sternal wire removal 10/29/2003    I&D of 2 upper sternal wires    Prior to Admission medications   Medication Sig Start Date End Date Taking? Authorizing Provider  ALPRAZolam (XANAX) 0.25 MG tablet Take 0.25 mg by mouth at bedtime.   Yes Antonieta Pert, MD  busPIRone (BUSPAR) 10 MG tablet Take 10 mg by mouth 3 (three) times daily.     Yes Historical Provider, MD  carbidopa-levodopa  (SINEMET) 25-100 MG per tablet Take 1 tablet by mouth 4 (four) times daily. 8 am, 12 pm, 4 pm, 9 pm   Yes Historical Provider, MD  cholecalciferol (VITAMIN D) 1000 UNITS tablet Take 2,000 Units by mouth daily.     Yes Historical Provider, MD  clopidogrel (PLAVIX) 75 MG tablet Take 75 mg by mouth daily.     Yes Historical Provider, MD  digoxin (LANOXIN) 0.125 MG tablet Take 125 mcg by mouth daily.     Yes Historical Provider, MD  docusate sodium (COLACE) 100 MG capsule Take 100 mg by mouth 2 (two) times daily.     Yes Historical Provider, MD  fenofibrate micronized (LOFIBRA) 134 MG capsule Take 134 mg by mouth daily before breakfast.     Yes Historical Provider, MD  fish oil-omega-3 fatty acids 1000 MG capsule Take 1 g by mouth daily.     Yes Historical Provider, MD  Flaxseed, Linseed, (FLAX SEED OIL) 1000 MG CAPS Take 1 capsule by mouth daily.     Yes Historical Provider, MD  furosemide (LASIX) 80 MG tablet Take 80 mg by mouth 2 (two) times daily.    Yes Historical Provider, MD  Lutein 20 MG TABS Take 1 tablet by mouth daily.     Yes Historical Provider, MD  magnesium oxide (MAG-OX) 400 MG tablet Take 400 mg by mouth daily.     Yes Historical Provider, MD  nitroGLYCERIN (NITROSTAT) 0.4  MG SL tablet Place 0.4 mg under the tongue every 5 (five) minutes as needed. Chest pain    Yes Historical Provider, MD  polyethylene glycol (MIRALAX / GLYCOLAX) packet Take 17 g by mouth daily.     Yes Historical Provider, MD  potassium chloride SA (K-DUR,KLOR-CON) 20 MEQ tablet Take 20 mEq by mouth daily.     Yes Historical Provider, MD  QUEtiapine (SEROQUEL) 25 MG tablet Take 25 mg by mouth 2 (two) times daily.   Yes Antonieta Pert, MD  sitaGLIPtin (JANUVIA) 50 MG tablet Take 50 mg by mouth daily.     Yes Historical Provider, MD  traMADol (ULTRAM) 50 MG tablet Take 1 tablet (50 mg total) by mouth every 6 (six) hours as needed. Maximum dose= 8 tablets per day. pain 05/18/11  Yes Antonieta Pert, MD    Social  History:  reports that she has never smoked. She has never used smokeless tobacco. She reports that she does not drink alcohol or use illicit drugs.  History reviewed. No pertinent family history.  Review of Systems:  Constitutional: Denies fever, chills, diaphoresis, appetite change and fatigue.  HEENT: Denies photophobia, eye pain, redness, hearing loss, ear pain, congestion, sore throat, rhinorrhea, sneezing, mouth sores, trouble swallowing, neck pain, neck stiffness and tinnitus.   Respiratory: Denies SOB, DOE, cough, chest tightness,  and wheezing.   Cardiovascular: Denies chest pain, palpitations and leg swelling.  Gastrointestinal: Denies nausea, vomiting, abdominal pain, diarrhea, constipation, blood in stool and abdominal distention.  Genitourinary: Denies dysuria, urgency, frequency, hematuria, flank pain and difficulty urinating.  Musculoskeletal: Denies myalgias, back pain, joint swelling, arthralgias and gait problem.  Skin: Denies pallor, rash and wound.  Neurological: Denies dizziness, seizures, syncope, weakness, light-headedness, numbness and headaches.  Hematological: Denies adenopathy. Easy bruising, personal or family bleeding history  Psychiatric/Behavioral: Denies suicidal ideation, mood changes, confusion, nervousness, sleep disturbance and agitation   Physical Exam: Blood pressure 110/70, pulse 62, temperature 97.7 F (36.5 C), temperature source Oral, resp. rate 15, SpO2 100.00%. General alert awake oriented to self and place only HEENT oral mucosa moist and pink neck no JVD or lymphadenopathy CVS S1-S2 regular rate rhythm no murmurs rubs or gallops Lungs clear to auscultation bilaterally Abdomen soft nontender with normal bowel sounds no organomegaly Extremities no edema clubbing or cyanosis Neuro slightly increased tone in lower extremities no localizing signs Skin no evidence of rashes or breakdown Musculoskeletal no evidence of synovitis or joint  deformities  Labs on Admission:  Results for orders placed during the hospital encounter of 12/27/11 (from the past 48 hour(s))  CBC WITH DIFFERENTIAL     Status: Abnormal   Collection Time   12/28/11 12:35 AM      Component Value Range Comment   WBC 26.7 (*) 4.0 - 10.5 K/uL    RBC 3.96  3.87 - 5.11 MIL/uL    Hemoglobin 11.1 (*) 12.0 - 15.0 g/dL    HCT 16.1 (*) 09.6 - 46.0 %    MCV 83.8  78.0 - 100.0 fL    MCH 28.0  26.0 - 34.0 pg    MCHC 33.4  30.0 - 36.0 g/dL    RDW 04.5  40.9 - 81.1 %    Platelets 222  150 - 400 K/uL    Neutrophils Relative 14 (*) 43 - 77 %    Lymphocytes Relative 83 (*) 12 - 46 %    Monocytes Relative 2 (*) 3 - 12 %    Eosinophils Relative 1  0 -  5 %    Basophils Relative 0  0 - 1 %    Neutro Abs 3.7  1.7 - 7.7 K/uL    Lymphs Abs 22.2 (*) 0.7 - 4.0 K/uL    Monocytes Absolute 0.5  0.1 - 1.0 K/uL    Eosinophils Absolute 0.3  0.0 - 0.7 K/uL    Basophils Absolute 0.0  0.0 - 0.1 K/uL    WBC Morphology ATYPICAL LYMPHOCYTES     BASIC METABOLIC PANEL     Status: Abnormal   Collection Time   12/28/11 12:35 AM      Component Value Range Comment   Sodium 137  135 - 145 mEq/L    Potassium 3.5  3.5 - 5.1 mEq/L    Chloride 96  96 - 112 mEq/L    CO2 31  19 - 32 mEq/L    Glucose, Bld 219 (*) 70 - 99 mg/dL    BUN 26 (*) 6 - 23 mg/dL    Creatinine, Ser 1.61  0.50 - 1.10 mg/dL    Calcium 9.3  8.4 - 09.6 mg/dL    GFR calc non Af Amer 51 (*) >90 mL/min    GFR calc Af Amer 59 (*) >90 mL/min   PROTIME-INR     Status: Normal   Collection Time   12/28/11 12:35 AM      Component Value Range Comment   Prothrombin Time 14.0  11.6 - 15.2 seconds    INR 1.06  0.00 - 1.49   POCT I-STAT TROPONIN I     Status: Normal   Collection Time   12/28/11 12:47 AM      Component Value Range Comment   Troponin i, poc 0.02  0.00 - 0.08 ng/mL    Comment 3            DIGOXIN LEVEL     Status: Normal   Collection Time   12/28/11  1:22 AM      Component Value Range Comment   Digoxin Level  1.0  0.8 - 2.0 ng/mL   URINALYSIS, ROUTINE W REFLEX MICROSCOPIC     Status: Abnormal   Collection Time   12/28/11  1:48 AM      Component Value Range Comment   Color, Urine YELLOW  YELLOW    APPearance CLEAR  CLEAR    Specific Gravity, Urine 1.015  1.005 - 1.030    pH 6.5  5.0 - 8.0    Glucose, UA 500 (*) NEGATIVE mg/dL    Hgb urine dipstick NEGATIVE  NEGATIVE    Bilirubin Urine NEGATIVE  NEGATIVE    Ketones, ur NEGATIVE  NEGATIVE mg/dL    Protein, ur NEGATIVE  NEGATIVE mg/dL    Urobilinogen, UA 0.2  0.0 - 1.0 mg/dL    Nitrite NEGATIVE  NEGATIVE    Leukocytes, UA NEGATIVE  NEGATIVE MICROSCOPIC NOT DONE ON URINES WITH NEGATIVE PROTEIN, BLOOD, LEUKOCYTES, NITRITE, OR GLUCOSE <1000 mg/dL.  CBC WITH DIFFERENTIAL     Status: Abnormal (Preliminary result)   Collection Time   12/28/11  3:27 AM      Component Value Range Comment   WBC 21.6 (*) 4.0 - 10.5 K/uL    RBC 3.87  3.87 - 5.11 MIL/uL    Hemoglobin 10.8 (*) 12.0 - 15.0 g/dL    HCT 04.5 (*) 40.9 - 46.0 %    MCV 83.7  78.0 - 100.0 fL    MCH 27.9  26.0 - 34.0 pg    MCHC 33.3  30.0 -  36.0 g/dL    RDW 01.0  27.2 - 53.6 %    Platelets 210  150 - 400 K/uL    Neutrophils Relative PENDING  43 - 77 %    Neutro Abs PENDING  1.7 - 7.7 K/uL    Band Neutrophils PENDING  0 - 10 %    Lymphocytes Relative PENDING  12 - 46 %    Lymphs Abs PENDING  0.7 - 4.0 K/uL    Monocytes Relative PENDING  3 - 12 %    Monocytes Absolute PENDING  0.1 - 1.0 K/uL    Eosinophils Relative PENDING  0 - 5 %    Eosinophils Absolute PENDING  0.0 - 0.7 K/uL    Basophils Relative PENDING  0 - 1 %    Basophils Absolute PENDING  0.0 - 0.1 K/uL    WBC Morphology PENDING      RBC Morphology PENDING      Smear Review PENDING      nRBC PENDING  0 /100 WBC    Metamyelocytes Relative PENDING      Myelocytes PENDING      Promyelocytes Absolute PENDING      Blasts PENDING       Radiological Exams on Admission: Dg Chest 2 View  12/28/2011  *RADIOLOGY REPORT*  Clinical  Data: Chest pain  CHEST - 2 VIEW  Comparison: 05/14/2011  Findings: Moderate cardiomegaly.  Normal vascularity.  Postop changes.  No pleural effusion.  No pneumothorax.  No consolidation.  IMPRESSION: Cardiomegaly without edema.  Original Report Authenticated By: Donavan Burnet, M.D.    Assessment/Plan 1. atypical chest pain Admit to telemetry bed, EKG with nonspecific T-wave changes in anterolateral leads unchanged from prior. Check cardiac enzymes x3 Start aspirin 325 mg daily, continue Plavix, also check 2D echo Check stat d-dimer, if abnormal will need CT angiogram to rule out PE  2. H/o CAD/CABG and PCI: continue Plavix  2. Parkinson disease/ dementia/ anxiety: Continue Sinemet, Xanax BuSpar and Seroquel  3. Leukocytosis: This is chronic from prior CBC results noted in our system, this has increased lymphocytosis with atypical lymphocytes, raising concern for a possibility of CLL, will need followup with hematology as outpatient  4. DM: hold Januvia, SSI  CODE STATUS: [place] DO NOT RESUSCITATE on her facility form noted  Time Spent on Admission:  Evora Schechter [facility] Pager: [phone] 12/28/2011, 3:49 AM

## 2011-12-28 NOTE — ED Notes (Signed)
Patient placed on cardiac monitor.

## 2011-12-28 NOTE — Progress Notes (Signed)
[facility] PROGRESS NOTE  [name] ZOX:[id number] DOB: [birth year] DOA: 12/27/2011 PCP: April Corwin, MD  Assessment/Plan: Principal Problem:  *Chest pain Active Problems:  A-fib  DM (diabetes mellitus)  CAD (coronary artery disease)  Dementia  Parkinson disease  Leukocytosis  1. Chest pain: atypical. Much improved.   EKG NSR with nonspecific T-wave changes in anterolateral leads unchanged from prior. Cardiac enzymes neg to date. Elevated d-dimer, CT angio pending but doubt PE. 2decho pending. Continue aspirin 325 mg daily, continue Plavix.  2. H/o CAD/CABG and PCI: continue Plavix . Dr. Donnie Baker cardiologist sees yearly. appt 9/13.  2. Parkinson disease/ dementia/ anxiety: At baseline. Dr April Baker neurologist.  Continue Sinemet, Xanax BuSpar and Seroquel  3. Leukocytosis: Urinalysis neg, chest xray without infiltrate. Afebrile, non-toxic appearing.  This is chronic from prior CBC results noted in our system, this has increased lymphocytosis with atypical lymphocytes, raising concern for a possibility of CLL, will need followup with hematology as outpatient  4. DM: hold Januvia, SSI. CBG 154 5. Afib: currently NSR. Continue dig.  6. HTN: controlled.  7. Dementia: at baseline according to daughter  Code Status: DNR Family Communication: Daughter at bedside Disposition Plan: assisted living. Likely return once medically stable.   Brief narrative: [name] is a pleasant 78 year old female from [facility] facility with history of Parkinson's disease, dementia, diabetes, CABG/PCI, hypertension admitted 12/28/11 with chest pain. Patient is a very poor historian and says that she started experiencing severe chest pain substernal in location, constant, unable to describe the nature, nonradiating. Daughter reports facility called to report pt awakened with complaints CP in the night. Daughter reports no recent illness. No fevers or chills.She does report occasional  heartburn   Consultants:    Procedures:    Antibiotics:    HPI/Subjective: Lying in bed eyes closed, aroused to verbal stimuli. Reports CP resolved. No events during night  Objective: Filed Vitals:   12/27/11 2353 12/28/11 0155 12/28/11 0246 12/28/11 0431  BP: 115/85 109/75 110/70 111/73  Pulse: 68 71 62 78  Temp: 97.5 F (36.4 C)  97.7 F (36.5 C) 98.1 F (36.7 C)  TempSrc: Oral  Oral Oral  Resp: 18 14 15 18   Height:    5\' 4"  (1.626 m)  Weight:    47.446 kg (104 lb 9.6 oz)  SpO2: 98% 99% 100% 96%   No intake or output data in the 24 hours ending 12/28/11 1033  Exam:   General:  Alert, oriented to self and place only.  Thin, somewhat frail appearing.  Cardiovascular: RRR, +murmur, no rub, gallop. No LEE PPP Anterior chest slightly tender to palp  Respiratory: normal effort, slightly shallow, BS somewhat distant but clear, no wheeze, rhonchi.   Abdomen: flat, soft +BS non-tender to palpation no mass/organmegaly  Data Reviewed: Basic Metabolic Panel:  Lab 12/28/11 8119  NA 137  K 3.5  CL 96  CO2 31  GLUCOSE 219*  BUN 26*  CREATININE 1.03  CALCIUM 9.3  MG --  PHOS --   Liver Function Tests: No results found for this basename: AST:5,ALT:5,ALKPHOS:5,BILITOT:5,PROT:5,ALBUMIN:5 in the last 168 hours No results found for this basename: LIPASE:5,AMYLASE:5 in the last 168 hours No results found for this basename: AMMONIA:5 in the last 168 hours CBC:  Lab 12/28/11 0327 12/28/11 0035  WBC 21.6* 26.7*  NEUTROABS 3.0 3.7  HGB 10.8* 11.1*  HCT 32.4* 33.2*  MCV 83.7 83.8  PLT 210 222   Cardiac Enzymes:  Lab 12/28/11 0505  CKTOTAL 31  CKMB 2.6  CKMBINDEX --  TROPONINI <0.30   BNP (last 3 results) No results found for this basename: PROBNP:3 in the last 8760 hours CBG:  Lab 12/28/11 0638  GLUCAP 154*    Recent Results (from the past 240 hour(s))  MRSA PCR SCREENING     Status: Normal   Collection Time   12/28/11  6:33 AM      Component Value  Range Status Comment   MRSA by PCR NEGATIVE  NEGATIVE Final      Studies: Dg Chest 2 View  12/28/2011  *RADIOLOGY REPORT*  Clinical Data: Chest pain  CHEST - 2 VIEW  Comparison: 05/14/2011  Findings: Moderate cardiomegaly.  Normal vascularity.  Postop changes.  No pleural effusion.  No pneumothorax.  No consolidation.  IMPRESSION: Cardiomegaly without edema.  Original Report Authenticated By: April Baker, M.D.    Scheduled Meds:   . sodium chloride   Intravenous STAT  . ALPRAZolam  0.25 mg Oral QHS  . aspirin  325 mg Oral Daily  . busPIRone  10 mg Oral TID  . carbidopa-levodopa  1 tablet Oral QID  . clopidogrel  75 mg Oral Daily  . digoxin  125 mcg Oral Daily  . docusate sodium  100 mg Oral BID  . enoxaparin (LOVENOX) injection  30 mg Subcutaneous Q24H  . fenofibrate  160 mg Oral Daily  . furosemide  80 mg Oral BID  . insulin aspart  0-9 Units Subcutaneous TID WC  . magnesium oxide  400 mg Oral Daily  . polyethylene glycol  17 g Oral Daily  . potassium chloride SA  20 mEq Oral Daily  . QUEtiapine  25 mg Oral BID  . sodium chloride  3 mL Intravenous Q12H   Continuous Infusions:    [facility] M,NP [facility] Pager [phone]  If 7PM-7AM, please contact night-coverage [email or web address] Password [facility] 12/28/2011, 10:33 AM   LOS: 1 day

## 2011-12-29 LAB — CBC
Hemoglobin: 12.3 g/dL (ref 12.0–15.0)
Platelets: 228 10*3/uL (ref 150–400)
RBC: 4.34 MIL/uL (ref 3.87–5.11)

## 2011-12-29 LAB — GLUCOSE, CAPILLARY
Glucose-Capillary: 136 mg/dL — ABNORMAL HIGH (ref 70–99)
Glucose-Capillary: 314 mg/dL — ABNORMAL HIGH (ref 70–99)

## 2011-12-29 NOTE — Progress Notes (Signed)
[facility] PROGRESS NOTE  April Baker ZOX:[id number] DOB: [birth year] DOA: 12/27/2011 PCP: Nadean Corwin, MD  Assessment/Plan: 1. Atypical chest pain: Resolved. Cardiac enzymes negative. CT angiogram of chest was negative. 2-D echocardiogram is pending. No further evaluation recommended at this point. 2. Chronic leukocytosis: No evidence of infection. Dates to at least January 2009. Followup as an outpatient as needed. 3. Pulmonary nodule: Consider followup in 1 year. 4. Diabetes mellitus: Fair control. Continue sliding scale insulin. Resume Januvia on discharge. 5. Atrial fibrillation: Stable. Continue digoxin. 6. History coronary artery disease: Continue Plavix. 7. Dementia, Parkinson's: At baseline. Continue Seroquel, BuSpar, Xanax and Sinemet.  Code Status: DO NOT RESUSCITATE Family Communication: None at bedside. No answer at [phone], [phone]. Disposition Plan: ALF vs. SNF 7/15. Medically stable for discharge 7/13 but ALF will not accept over the weekend.  Brendia Sacks, MD  [facility] Pager [phone]. If 8PM-8AM, please contact night-coverage at [email or web address], password [facility] 12/29/2011, 2:22 PM  LOS: 2 days   Brief narrative: 78 year old woman arrived the EMS from nursing home for substernal chest pain. She was admitted for atypical chest pain, serial cardiac enzymes. D-dimer was positive but CT of chest was negative for pulmonary embolism.   Consultants:  None  Procedures:  2-D echocardiogram  HPI/Subjective: No complaints today. No chest pain. No shortness of breath.  Objective: Filed Vitals:   12/28/11 1334 12/28/11 1900 12/29/11 0443 12/29/11 1404  BP: 105/66 122/56 131/63 129/64  Pulse: 66 68 61 74  Temp: 98 F (36.7 C) 98.1 F (36.7 C) 97.8 F (36.6 C) 98.6 F (37 C)  TempSrc: Oral Oral Oral Oral  Resp: 16 16 17 16   Height:      Weight:   47.3 kg (104 lb 4.4 oz)   SpO2: 94% 94% 95% 97%    Intake/Output Summary  (Last 24 hours) at 12/29/11 1422 Last data filed at 12/29/11 1406  Gross per 24 hour  Intake    240 ml  Output   1010 ml  Net   -770 ml    Exam:   General:  Appears calm and comfortable.  Cardiovascular: Regular rate and rhythm. No murmur, rub, gallop. No lower extremity edema.  Respiratory: Clear to auscultation bilaterally. No wheezes, rales, rhonchi. Normal respiratory effort.  Musculoskeletal: Grossly normal tone and strength upper and lower extremities bilaterally.  Data Reviewed: Basic Metabolic Panel:  Lab 12/28/11 5284  NA 137  K 3.5  CL 96  CO2 31  GLUCOSE 219*  BUN 26*  CREATININE 1.03  CALCIUM 9.3  MG --  PHOS --   CBC:  Lab 12/29/11 0800 12/28/11 0327 12/28/11 0035  WBC 20.5* 21.6* 26.7*  NEUTROABS -- 3.0 3.7  HGB 12.3 10.8* 11.1*  HCT 36.7 32.4* 33.2*  MCV 84.6 83.7 83.8  PLT 228 210 222   Cardiac Enzymes:  Lab 12/28/11 1958 12/28/11 1217 12/28/11 0505  CKTOTAL 24 25 31   CKMB 1.8 2.0 2.6  CKMBINDEX -- -- --  TROPONINI <0.30 <0.30 <0.30   CBG:  Lab 12/29/11 1108 12/29/11 0648 12/28/11 2133 12/28/11 1626 12/28/11 1108  GLUCAP 314* 136* 235* 182* 261*    Recent Results (from the past 240 hour(s))  MRSA PCR SCREENING     Status: Normal   Collection Time   12/28/11  6:33 AM      Component Value Range Status Comment   MRSA by PCR NEGATIVE  NEGATIVE Final      Studies: Dg Chest 2 View  12/28/2011  *RADIOLOGY REPORT*  Clinical Data: Chest pain  CHEST - 2 VIEW  Comparison: 05/14/2011  Findings: Moderate cardiomegaly.  Normal vascularity.  Postop changes.  No pleural effusion.  No pneumothorax.  No consolidation.  IMPRESSION: Cardiomegaly without edema.  Original Report Authenticated By: Donavan Burnet, M.D.   Ct Angio Chest W/cm &/or Wo Cm  12/28/2011  *RADIOLOGY REPORT*  Clinical Data: Chest pain since last evening  CT ANGIOGRAPHY CHEST  Technique:  Multidetector CT imaging of the chest using the standard protocol during bolus administration  of intravenous contrast. Multiplanar reconstructed images including MIPs were obtained and reviewed to evaluate the vascular anatomy.  Contrast: OMNIPAQUE IOHEXOL 350 MG/ML SOLN  Comparison: CT thorax 03/30 1012  Findings: There is no filling defects within the pulmonary arteries to suggest acute pulmonary embolism.  No acute findings of the aorta or great vessels.  No pericardial fluid.  Coronary calcifications present.  There is gas in the esophagus small amount of high density fluid which may represent refluxed liquid oral medication or barium.  No acute pulmonary parenchymal abnormality.  No evidence of pneumothorax, pleural fluid or pneumonia. There is a small pulmonary nodule in the right upper lobe measuring 3 mm (image 50).  Limited view of the upper abdomen is unremarkable.  The spleen may be enlarged.  Limited view of the skeleton shows no acute findings.  IMPRESSION:  1.  No acute pulmonary embolism. 2.  Coronary calcifications.  3.  Small amount high density fluid within the esophagus may represent refluxed oral medication.  4.  Small right upper lobe pulmonary nodule. If the patient is at high risk for bronchogenic carcinoma, follow-up chest CT at 1 year is recommended.  If the patient is at low risk, no follow-up is needed.  This recommendation follows the consensus statement: Guidelines for Management of Small Pulmonary Nodules Detected on CT Scans:  A Statement from the Fleischner Society as published in Radiology 2005; 237:395-400. 6.  Although the spleen is not entirely imaged if appears mildly enlarged.  Original Report Authenticated By: Genevive Bi, M.D.    Scheduled Meds:   . sodium chloride   Intravenous STAT  . ALPRAZolam  0.25 mg Oral QHS  . aspirin  325 mg Oral Daily  . busPIRone  10 mg Oral TID  . carbidopa-levodopa  1 tablet Oral QID  . clopidogrel  75 mg Oral Daily  . digoxin  125 mcg Oral Daily  . docusate sodium  100 mg Oral BID  . enoxaparin (LOVENOX) injection   30 mg Subcutaneous Q24H  . fenofibrate  160 mg Oral Daily  . furosemide  80 mg Oral BID  . insulin aspart  0-9 Units Subcutaneous TID WC  . magnesium oxide  400 mg Oral Daily  . polyethylene glycol  17 g Oral Daily  . potassium chloride SA  20 mEq Oral Daily  . QUEtiapine  25 mg Oral BID  . sodium chloride  3 mL Intravenous Q12H   Continuous Infusions:   Principal Problem:  *Chest pain Active Problems:  A-fib  DM (diabetes mellitus)  CAD (coronary artery disease)  Dementia  Parkinson disease  Leukocytosis     Brendia Sacks, MD  [facility] Pager [phone]. If 8PM-8AM, please contact night-coverage at [email or web address], password [facility] 12/29/2011, 2:22 PM  LOS: 2 days

## 2011-12-29 NOTE — Evaluation (Signed)
Physical Therapy Evaluation Patient Details Name: April Baker MRN: [id number] DOB: [birth year] Today's Date: 12/29/2011 Time: 9528-4132 PT Time Calculation (min): 31 min  PT Assessment / Plan / Recommendation Clinical Impression  Pt admitted with CP from [facility]. Pt very agitated, condescending, and cursing with any movement or any assist for mobility that is not initiated or suggested by pt even if unsafe. Pt lacks reasoning and problem solving skills. Pt states she ambulated with wheelchair with " a therapy master" but adamantly refused any attempts at mobility or AD use today even just bed to chair. Pt refused sitting up in chair and returned to bed. Will attempt trial of PT acutely but very feel pt has poor ability to follow and retain education for mobility and most likely at baselin. Due to pt decreased safety, strength, mentation and need for assist feel SNF would be most appropriate discharge plan. Will follow acutely to attempt to increase mobility, strength and balance.     PT Assessment  Patient needs continued PT services    Follow Up Recommendations  Skilled nursing facility    Barriers to Discharge None      Equipment Recommendations  Defer to next venue    Recommendations for Other Services     Frequency  (trial)    Precautions / Restrictions Precautions Precautions: Fall Restrictions Weight Bearing Restrictions: No   Pertinent Vitals/Pain Pt states chronic back pain all the time      Mobility  Bed Mobility Bed Mobility: Supine to Sit;Sit to Supine;Scooting to HOB Supine to Sit: 4: Min assist;HOB elevated Sit to Supine: 4: Min assist;HOB elevated Scooting to [facility]: 1: +1 Total assist Details for Bed Mobility Assistance: HOB 20degrees throughout denied attempt with bed flat. Pad for use of scooting to [facility]. Assist to elevate trunk to sitting and assist to bring legs into bed with return to supine Transfers Transfers: Sit to Stand;Stand to Sit;Stand Pivot  Transfers Sit to Stand: 3: Mod assist;From bed;From chair/3-in-1 Stand to Sit: 3: Mod assist;To bed;To chair/3-in-1 Stand Pivot Transfers: 3: Mod assist Details for Transfer Assistance: Pt stood from bed and pivoted to [facility] with inability/refusal to follow cues for safe hand placement. Pt stood from [facility] and grasped RW for pericare in standing then pivoted 180degrees holding onto recliner armrests into chair. Grossly 30 sec in recliner pt began standing again without warning then agitated that bed not ready to get in. Pt would not try sitting up in chair and adament for return to bed. Pt pivoted recliner back to bed.  Ambulation/Gait Ambulation/Gait Assistance: Not tested (comment) (pt refused) Stairs: No    Exercises     PT Diagnosis: Difficulty walking;Generalized weakness;Altered mental status  PT Problem List: Decreased strength;Decreased activity tolerance;Decreased balance;Decreased mobility;Decreased cognition;Decreased safety awareness;Decreased knowledge of use of DME PT Treatment Interventions: DME instruction;Gait training;Functional mobility training;Therapeutic activities;Therapeutic exercise;Balance training;Patient/family education   PT Goals Acute Rehab PT Goals PT Goal Formulation: Patient unable to participate in goal setting Time For Goal Achievement: 01/05/12 Potential to Achieve Goals: Poor Pt will go Supine/Side to Sit: with supervision PT Goal: Supine/Side to Sit - Progress: Goal set today Pt will go Sit to Supine/Side: with supervision PT Goal: Sit to Supine/Side - Progress: Goal set today Pt will go Sit to Stand: with min assist PT Goal: Sit to Stand - Progress: Goal set today Pt will go Stand to Sit: with min assist PT Goal: Stand to Sit - Progress: Goal set today Pt will Transfer Bed to Chair/Chair  to Bed: with min assist PT Transfer Goal: Bed to Chair/Chair to Bed - Progress: Goal set today Pt will Ambulate: 1 - 15 feet;with least restrictive assistive  device;with mod assist PT Goal: Ambulate - Progress: Goal set today  Visit Information  Last PT Received On: 12/29/11 Assistance Needed: +1    Subjective Data  Subjective: "oh, damn it you look like a baby but sound like an adult" Patient Stated Goal: "to get the hell out of here"   Prior Functioning  Home Living Lives With: Other (Comment) Available Help at Discharge: Available 24 hours/day Type of Home: Assisted living Home Access: Level entry Home Layout: One level Bathroom Shower/Tub: Health visitor: Handicapped height Home Adaptive Equipment: Bedside commode/3-in-1 Prior Function Level of Independence: Needs assistance Needs Assistance: Bathing;Dressing;Meal Prep;Light Housekeeping;Toileting;Transfers Bath: Maximal Dressing: Maximal Toileting: Maximal Meal Prep: Total Light Housekeeping: Total Transfer Assistance: min-mod assist for transfers to stand pivot Able to Take Stairs?: No Driving: No Vocation: Retired Musician: No difficulties    Cognition  Overall Cognitive Status: History of cognitive impairments - at baseline Arousal/Alertness: Awake/alert Orientation Level: Disoriented X4;Place;Situation;Time Behavior During Session: Agitated Cognition - Other Comments: Pt very agitated with any suggestion or request throughout tx. Pt stating she can't urinate- during urination on [facility]    Extremity/Trunk Assessment Right Lower Extremity Assessment RLE ROM/Strength/Tone: Deficits;Unable to fully assess;Due to impaired cognition RLE ROM/Strength/Tone Deficits: grossly 2/5 strength- pt would not allow formal testing Left Lower Extremity Assessment LLE ROM/Strength/Tone: Deficits;Unable to fully assess;Due to impaired cognition Trunk Assessment Trunk Assessment: Kyphotic   Balance Static Sitting Balance Static Sitting - Balance Support: No upper extremity supported;Feet supported Static Sitting - Level of Assistance: 5: Stand by  assistance Static Sitting - Comment/# of Minutes: 5 EOB Static Standing Balance Static Standing - Balance Support: Bilateral upper extremity supported Static Standing - Level of Assistance: 3: Mod assist Static Standing - Comment/# of Minutes: 2  End of Session PT - End of Session Equipment Utilized During Treatment: Gait belt Activity Tolerance: Treatment limited secondary to agitation Patient left: in bed;with call bell/phone within reach;with bed alarm set Nurse Communication: Mobility status  GP     Delorse Lek 12/29/2011, 9:11 AM  Delaney Meigs, PT [phone]

## 2011-12-30 DIAGNOSIS — I4891 Unspecified atrial fibrillation: Secondary | ICD-10-CM

## 2011-12-30 LAB — GLUCOSE, CAPILLARY
Glucose-Capillary: 141 mg/dL — ABNORMAL HIGH (ref 70–99)
Glucose-Capillary: 388 mg/dL — ABNORMAL HIGH (ref 70–99)
Glucose-Capillary: 339 mg/dL — ABNORMAL HIGH (ref 70–99)

## 2011-12-30 NOTE — Progress Notes (Addendum)
[facility] PROGRESS NOTE  April Baker NWG:[id number] DOB: 08/27/1932 DOA: 12/27/2011 PCP: Nadean Corwin, MD  Assessment/Plan: 1. Atypical chest pain: Resolved. Cardiac enzymes negative. CT angiogram of chest was negative. 2-D echocardiogram interpretation is pending. No further evaluation recommended at this point. 2. Chronic leukocytosis: No evidence of infection. Dates to at least January 2009. Followup as an outpatient as needed. 3. Pulmonary nodule: Consider followup in 1 year. 4. Diabetes mellitus: Fair control. Continue sliding scale insulin. Resume Januvia on discharge. 5. Atrial fibrillation: Stable. Continue digoxin. 6. History coronary artery disease: Continue Plavix. 7. Dementia, Parkinson's: At baseline. Continue Seroquel, BuSpar, Xanax and Sinemet.  Assessment and plan remains current 7/14.  Code Status: DO NOT RESUSCITATE Family Communication: None at bedside. No answer at [phone], [phone]. Disposition Plan: ALF vs. SNF 7/15. Medically stable for discharge 7/13 but ALF will not accept over the weekend.  Brendia Sacks, MD  [facility] Pager [phone]. If 8PM-8AM, please contact night-coverage at [email or web address], password [facility] 12/30/2011, 10:03 AM  LOS: 3 days   Brief narrative: 78 year old woman arrived the EMS from nursing home for substernal chest pain. She was admitted for atypical chest pain, serial cardiac enzymes. D-dimer was positive but CT of chest was negative for pulmonary embolism.   Consultants:  None  Procedures:  2-D echocardiogram  HPI/Subjective: Feels fine today. No chest pain or shortness of breath.  Objective: Filed Vitals:   12/29/11 0443 12/29/11 1404 12/29/11 1927 12/30/11 0533  BP: 131/63 129/64 122/71 124/69  Pulse: 61 74 74 70  Temp: 97.8 F (36.6 C) 98.6 F (37 C) 98.7 F (37.1 C) 98.5 F (36.9 C)  TempSrc: Oral Oral Oral Oral  Resp: 17 16 18 18   Height:      Weight: 47.3 kg (104 lb 4.4  oz)   46.2 kg (101 lb 13.6 oz)  SpO2: 95% 97% 96% 96%    Intake/Output Summary (Last 24 hours) at 12/30/11 1003 Last data filed at 12/29/11 1649  Gross per 24 hour  Intake    360 ml  Output      0 ml  Net    360 ml    Exam:   General:  Appears calm and comfortable.  Cardiovascular: Regular rate and rhythm. No murmur, rub, gallop. No lower extremity edema.  Telemetry: Sinus rhythm. No arrhythmias.  Respiratory: Clear to auscultation bilaterally. No wheezes, rales, rhonchi. Normal respiratory effort.  Data Reviewed: Basic Metabolic Panel:  Lab 12/28/11 6440  NA 137  K 3.5  CL 96  CO2 31  GLUCOSE 219*  BUN 26*  CREATININE 1.03  CALCIUM 9.3  MG --  PHOS --   CBC:  Lab 12/29/11 0800 12/28/11 0327 12/28/11 0035  WBC 20.5* 21.6* 26.7*  NEUTROABS -- 3.0 3.7  HGB 12.3 10.8* 11.1*  HCT 36.7 32.4* 33.2*  MCV 84.6 83.7 83.8  PLT 228 210 222   Cardiac Enzymes:  Lab 12/28/11 1958 12/28/11 1217 12/28/11 0505  CKTOTAL 24 25 31   CKMB 1.8 2.0 2.6  CKMBINDEX -- -- --  TROPONINI <0.30 <0.30 <0.30   CBG:  Lab 12/30/11 0555 12/29/11 2102 12/29/11 1609 12/29/11 1108 12/29/11 0648  GLUCAP 141* 155* 200* 314* 136*    Recent Results (from the past 240 hour(s))  MRSA PCR SCREENING     Status: Normal   Collection Time   12/28/11  6:33 AM      Component Value Range Status Comment   MRSA by PCR NEGATIVE  NEGATIVE Final  Studies:   Scheduled Meds:    . ALPRAZolam  0.25 mg Oral QHS  . aspirin  325 mg Oral Daily  . busPIRone  10 mg Oral TID  . carbidopa-levodopa  1 tablet Oral QID  . clopidogrel  75 mg Oral Daily  . digoxin  125 mcg Oral Daily  . docusate sodium  100 mg Oral BID  . enoxaparin (LOVENOX) injection  30 mg Subcutaneous Q24H  . fenofibrate  160 mg Oral Daily  . furosemide  80 mg Oral BID  . insulin aspart  0-9 Units Subcutaneous TID WC  . magnesium oxide  400 mg Oral Daily  . polyethylene glycol  17 g Oral Daily  . potassium chloride SA  20 mEq  Oral Daily  . QUEtiapine  25 mg Oral BID  . sodium chloride  3 mL Intravenous Q12H   Continuous Infusions:   Principal Problem:  *Chest pain Active Problems:  DM (diabetes mellitus)  A-fib  CAD (coronary artery disease)  Dementia  Parkinson disease  Leukocytosis     Brendia Sacks, MD  [facility] Pager [phone]. If 8PM-8AM, please contact night-coverage at [email or web address], password [facility] 12/30/2011, 10:03 AM  LOS: 3 days

## 2011-12-31 LAB — GLUCOSE, CAPILLARY

## 2011-12-31 MED ORDER — INSULIN GLARGINE 100 UNIT/ML ~~LOC~~ SOLN: 5.0000 [IU] | Freq: Every day | SUBCUTANEOUS | Status: DC

## 2011-12-31 MED ORDER — LINAGLIPTIN 5 MG PO TABS: 10.0000 mg | ORAL_TABLET | Freq: Every day | ORAL | Status: DC

## 2011-12-31 MED ORDER — INSULIN GLARGINE 100 UNIT/ML ~~LOC~~ SOLN: 10.0000 [IU] | Freq: Every day | SUBCUTANEOUS | Status: DC

## 2011-12-31 MED ORDER — CARVEDILOL 3.125 MG PO TABS
3.1250 mg | ORAL_TABLET | Freq: Two times a day (BID) | ORAL | Status: DC
  Administered 2011-12-31: 3.125 mg via ORAL
  Filled 2011-12-31 (×4): qty 1

## 2011-12-31 MED ORDER — LINAGLIPTIN 5 MG PO TABS
5.0000 mg | ORAL_TABLET | Freq: Every day | ORAL | Status: DC
  Filled 2011-12-31: qty 1

## 2011-12-31 NOTE — Progress Notes (Signed)
Spoke with April Baker at ALF to advise her of PT recommendations for SNF- per her report the PT recommendations that I have read to her is her "baseline" and they are comfortable taking her back. Also advised her that the MD is planning d/c tomorrow due to echo and med adjustments- will f/u with patient's daughter as well and f/u tomorrow morning for further d/c planning. Reece Levy, MSW, [facility] [phone]

## 2011-12-31 NOTE — Progress Notes (Signed)
Inpatient Diabetes Program Recommendations  AACE/ADA: New Consensus Statement on Inpatient Glycemic Control (2009)  Target Ranges:  Prepandial:   less than 140 mg/dL      Peak postprandial:   less than 180 mg/dL (1-2 hours)      Critically ill patients:  140 - 180 mg/dL   Reason for Visit:   Inpatient Diabetes Program Recommendations Correction (SSI): Please add HS correction scale Insulin - Meal Coverage: Please consider addition of Novolog meal coverage at 4o3-4 units tidwc and/or Tradjenta per below. Oral Agents: Pt takes Januvia 50 mg at home.  Our generic is Tradjenta equivalent to 10 mg/day.  Please order and/or add meal coverage.  Note: Thank you, Lenor Coffin, RN, CNS, Diabetes Coordinator [phone])

## 2011-12-31 NOTE — Progress Notes (Signed)
Physical Therapy Treatment Patient Details Name: April Baker MRN: [id number] DOB: 1933/06/15 Today's Date: 12/31/2011 Time: 7829-5621 PT Time Calculation (min): 23 min  PT Assessment / Plan / Recommendation Comments on Treatment Session  Pt unable to open her eyes without use of bilateral UE to pry open eyelids.  Pt activity tolerance much improved and pt appear less confused.  Daughter reports pt to discharge back to ALF.       Follow Up Recommendations  Skilled nursing facility    Barriers to Discharge        Equipment Recommendations  Defer to next venue    Recommendations for Other Services    Frequency Min 2X/week   Plan Discharge plan remains appropriate;Frequency remains appropriate    Precautions / Restrictions Precautions Precautions: Fall Restrictions Weight Bearing Restrictions: No   Pertinent Vitals/Pain C/o Right wrist pain but unable to quantify.  RN notified.     Mobility  Bed Mobility Bed Mobility: Not assessed Transfers Transfers: Sit to Stand;Stand to Sit Sit to Stand: 4: Min assist;From chair/3-in-1;With upper extremity assist Stand to Sit: 4: Min assist;To chair/3-in-1;With upper extremity assist Stand Pivot Transfers: Not tested (comment) Details for Transfer Assistance: Min assist via verbal cues for hand placement and manual facilitation to steady pt secondary to LE weakness.  Pt c/o R wrist pain when bearing weight through R UE to initiate standing or control descent to sit.   Ambulation/Gait Ambulation/Gait Assistance: 4: Min assist Ambulation Distance (Feet): 140 Feet Assistive device: Rolling walker Ambulation/Gait Assistance Details: Verbal cueing for pt' to decrease distance from walker.  Min assist to steer walker as pt has difficulty opening eyes (Parikinson's).  Cueing to increase bilateral step length. Pt able to take longer steps for a few feet then revert to shuffle gait.  Gait is very slow and unsteady, with occasional scissoring of  LEs.  Gait Pattern: Step-to pattern;Decreased step length - right;Decreased step length - left;Decreased stride length;Decreased hip/knee flexion - right;Decreased hip/knee flexion - left;Shuffle;Scissoring;Trunk flexed;Narrow base of support Gait velocity: excessively slow Stairs: No Wheelchair Mobility Wheelchair Mobility: No    Exercises     PT Diagnosis:    PT Problem List:   PT Treatment Interventions:     PT Goals Acute Rehab PT Goals PT Goal Formulation: With patient/family Time For Goal Achievement: 01/05/12 Potential to Achieve Goals: Fair Pt will go Supine/Side to Sit: with supervision Pt will go Sit to Stand: with min assist PT Goal: Sit to Stand - Progress: Met Pt will go Stand to Sit: with min assist PT Goal: Stand to Sit - Progress: Met Pt will Ambulate: 1 - 15 feet;with least restrictive assistive device;with mod assist PT Goal: Ambulate - Progress: Met  Visit Information  Last PT Received On: 12/31/11 Assistance Needed: +1    Subjective Data      Cognition  Overall Cognitive Status: History of cognitive impairments - at baseline Arousal/Alertness: Lethargic Orientation Level: Disoriented X4;Place;Situation;Time Behavior During Session: [facility] for tasks performed    Balance  Balance Balance Assessed: No  End of Session PT - End of Session Equipment Utilized During Treatment: Gait belt Activity Tolerance: Patient tolerated treatment well Patient left: in chair;with call bell/phone within reach;with family/visitor present Nurse Communication: Mobility status   GP     April Baker 12/31/2011, 4:10 PM April Baker DPT [phone]

## 2011-12-31 NOTE — Progress Notes (Signed)
OT Cancellation Note  Treatment cancelled today due to pt's in ability to stay awake for evaluation. Will attempt later as schedule allows.  April Baker 12/31/2011, 11:21 AM

## 2011-12-31 NOTE — Progress Notes (Signed)
[facility] PROGRESS NOTE  April Baker OZH:[id number] DOB: 1932/08/31 DOA: 12/27/2011 PCP: Nadean Corwin, MD Cardiologist: Georga Hacking, MD  Assessment/Plan: 1. Atypical chest pain: Resolved. Cardiac enzymes negative. CT angiogram of chest was negative. 2-D echocardiogram noted.  2. New systolic dysfunction: Case discussed with Dr. Donnie Aho who has reviewed the patient's chart and provides additional information: Last echocardiogram on file 08/2010: Left ventricular ejection fraction 50-55%. Mild inferior hypokinesis. Dr. Donnie Aho has reviewed echocardiogram from this hospitalization and feels that the left ventricular ejection fraction is 55-60% and mild hypokinesis is present there is no akinesis. Dr. Donnie Aho is recommended no further investigation for echocardiogram findings. He has recommended low-dose ACE inhibitor and beta blocker as blood pressure can tolerate. He will followup in the office September/2012. 3. Chronic leukocytosis: No evidence of infection. Dates to at least January 2009. Followup as an outpatient as needed. 4. Pulmonary nodule: Consider followup in 1 year. 5. Diabetes mellitus: Poor control. Continue sliding scale insulin. Resume Januvia on discharge. Start low-dose Lantus. 6. Atrial fibrillation: Stable. Continue digoxin. 7. History coronary artery disease: Continue Plavix. 8. Dementia, Parkinson's: At baseline. Continue Seroquel, BuSpar, Xanax and Sinemet.  As recommended by cardiology we will start low-dose beta blocker and observed. If remains stable plan discharge 7/15. Discussed with daughter by telephone and updated him care.  Code Status: DO NOT RESUSCITATE Family Communication: None at bedside. No answer at [phone], [phone]. Disposition Plan: ALF vs. SNF 7/15. Medically stable for discharge 7/13 but ALF will not accept over the weekend.  Brendia Sacks, MD  [facility] Pager [phone]. If 8PM-8AM, please contact night-coverage  at [email or web address], password [facility] 12/31/2011, 11:49 AM  LOS: 4 days   Brief narrative: 78 year old woman arrived the EMS from nursing home for substernal chest pain. She was admitted for atypical chest pain, serial cardiac enzymes. D-dimer was positive but CT of chest was negative for pulmonary embolism.   Consultants:  None  Procedures:  2-D echocardiogram: LVEF 35%-40%. Akinesis inferior myocardium. Grade 1 diastolic dysfunction. Discussed with Dr. Donnie Aho who has reviewed these images and reports that LVEF is mildly reduced and is closer to 45-50% and that hypokinesis.  Previous echocardiogram from Dr. York Spaniel office--08/2011 LVEF 50-55% mild inferior hypokinesis.  HPI/Subjective: No complaints today. No chest pain.  Objective: Filed Vitals:   12/30/11 1309 12/30/11 1946 12/31/11 0443 12/31/11 1001  BP: 144/66 119/61 118/53   Pulse: 74 78 63 78  Temp: 98.5 F (36.9 C) 98.6 F (37 C) 97.6 F (36.4 C)   TempSrc: Oral Oral Oral   Resp: 16 18 16    Height:      Weight:   46 kg (101 lb 6.6 oz)   SpO2: 94% 96% 95%     Intake/Output Summary (Last 24 hours) at 12/31/11 1149 Last data filed at 12/31/11 1023  Gross per 24 hour  Intake   1495 ml  Output      0 ml  Net   1495 ml    Exam:   General:  Appears calm and comfortable.  Cardiovascular: Regular rate and rhythm. No murmur, rub, gallop. No lower extremity edema.  Telemetry: Sinus rhythm. No arrhythmias.  Respiratory: Clear to auscultation bilaterally. No wheezes, rales, rhonchi. Normal respiratory effort.  Data Reviewed: Basic Metabolic Panel:  Lab 12/28/11 7425  NA 137  K 3.5  CL 96  CO2 31  GLUCOSE 219*  BUN 26*  CREATININE 1.03  CALCIUM 9.3  MG --  PHOS --   CBC:  Lab 12/29/11 0800  12/28/11 0327 12/28/11 0035  WBC 20.5* 21.6* 26.7*  NEUTROABS -- 3.0 3.7  HGB 12.3 10.8* 11.1*  HCT 36.7 32.4* 33.2*  MCV 84.6 83.7 83.8  PLT 228 210 222   Cardiac Enzymes:  Lab 12/28/11 1958 12/28/11 1217  12/28/11 0505  CKTOTAL 24 25 31   CKMB 1.8 2.0 2.6  CKMBINDEX -- -- --  TROPONINI <0.30 <0.30 <0.30   CBG:  Lab 12/31/11 1123 12/30/11 2038 12/30/11 1617 12/30/11 1100 12/30/11 0555  GLUCAP 306* 339* 165* 388* 141*    Recent Results (from the past 240 hour(s))  MRSA PCR SCREENING     Status: Normal   Collection Time   12/28/11  6:33 AM      Component Value Range Status Comment   MRSA by PCR NEGATIVE  NEGATIVE Final      Studies:   Scheduled Meds:    . ALPRAZolam  0.25 mg Oral QHS  . aspirin  325 mg Oral Daily  . busPIRone  10 mg Oral TID  . carbidopa-levodopa  1 tablet Oral QID  . clopidogrel  75 mg Oral Daily  . digoxin  125 mcg Oral Daily  . docusate sodium  100 mg Oral BID  . enoxaparin (LOVENOX) injection  30 mg Subcutaneous Q24H  . fenofibrate  160 mg Oral Daily  . furosemide  80 mg Oral BID  . insulin aspart  0-9 Units Subcutaneous TID WC  . magnesium oxide  400 mg Oral Daily  . polyethylene glycol  17 g Oral Daily  . potassium chloride SA  20 mEq Oral Daily  . QUEtiapine  25 mg Oral BID  . sodium chloride  3 mL Intravenous Q12H   Continuous Infusions:   Principal Problem:  *Chest pain Active Problems:  DM (diabetes mellitus)  A-fib  CAD (coronary artery disease)  Dementia  Parkinson disease  Leukocytosis     Brendia Sacks, MD  [facility] Pager [phone]. If 8PM-8AM, please contact night-coverage at [email or web address], password [facility] 12/31/2011, 11:49 AM  LOS: 4 days

## 2011-12-31 NOTE — Progress Notes (Signed)
Utilization review complete 

## 2012-01-01 LAB — GLUCOSE, CAPILLARY: Glucose-Capillary: 150 mg/dL — ABNORMAL HIGH (ref 70–99)

## 2012-01-01 MED ORDER — CARVEDILOL 3.125 MG PO TABS: 3.1250 mg | ORAL_TABLET | Freq: Two times a day (BID) | ORAL | Status: DC

## 2012-01-01 MED ORDER — LISINOPRIL 2.5 MG PO TABS: 2.5000 mg | ORAL_TABLET | Freq: Every day | ORAL | Status: DC

## 2012-01-01 MED ORDER — ALPRAZOLAM 0.25 MG PO TABS: 0.2500 mg | ORAL_TABLET | Freq: Every day | ORAL | Status: DC

## 2012-01-01 NOTE — Progress Notes (Signed)
Patient for d/c back today to [facility]- Daughter and ALF staff agreeable to plans. Daughter to transport via EMS.  Reece Levy, MSW, Theresia Majors [phone]

## 2012-01-01 NOTE — Evaluation (Signed)
Occupational Therapy Evaluation Patient Details Name: April Baker MRN: [id number] DOB: 09-17-1932 Today's Date: 01/01/2012    OT Assessment / Plan / Recommendation Clinical Impression  Pt presents to OT with decreased I with ADL activity.  Pt is appropriate got DC back to ALF as she will have help with ADL activity. Pt will benefit from OT at SNF to encourage I and safety with ADL activity.    OT Assessment  Patient needs continued OT Services    Follow Up Recommendations  Home health OT;Other (comment) (at ALF)       Equipment Recommendations  Defer to next venue       Frequency  Min 2X/week           ADL  Eating/Feeding: Simulated;Set up Where Assessed - Eating/Feeding: Edge of bed Grooming: Performed;Set up;Brushing hair Where Assessed - Grooming: Unsupported sitting Upper Body Bathing: Simulated;Minimal assistance Where Assessed - Upper Body Bathing: Unsupported sitting Lower Body Bathing: Simulated;Moderate assistance Where Assessed - Lower Body Bathing: Supported sit to stand Upper Body Dressing: Simulated;Minimal assistance Where Assessed - Upper Body Dressing: Unsupported sitting Lower Body Dressing: Maximal assistance Where Assessed - Lower Body Dressing: Supported sit to stand Toilet Transfer: Simulated;Moderate assistance Toilet Transfer Method: Sit to stand;Stand pivot Toilet Transfer Equipment: Comfort height toilet Toileting - Clothing Manipulation and Hygiene: Simulated;Maximal assistance Where Assessed - Toileting Clothing Manipulation and Hygiene: Standing    OT Diagnosis: Generalized weakness  OT Treatment Interventions: Self-care/ADL training;Patient/family education   OT Goals Acute Rehab OT Goals OT Goal Formulation: With patient ADL Goals Pt Will Perform Grooming: with supervision;Standing at sink ADL Goal: Grooming - Progress: Goal set today Pt Will Transfer to Toilet: with supervision;Comfort height toilet ADL Goal: Toilet Transfer -  Progress: Goal set today Pt Will Perform Toileting - Clothing Manipulation: with supervision;Standing ADL Goal: Toileting - Clothing Manipulation - Progress: Goal set today Pt Will Perform Toileting - Hygiene: with supervision ADL Goal: Toileting - Hygiene - Progress: Goal set today  Visit Information  Last OT Received On: 01/01/12       Prior Functioning  Vision/Perception  Home Living Lives With: Other (Comment) Available Help at Discharge: Available 24 hours/day Type of Home: Assisted living Home Access: Level entry Home Layout: One level Bathroom Shower/Tub: Health visitor: Handicapped height Home Adaptive Equipment: Bedside commode/3-in-1 Prior Function Level of Independence: Needs assistance Needs Assistance: Bathing;Dressing;Meal Prep;Light Housekeeping;Toileting;Transfers Bath: Maximal Dressing: Maximal Toileting: Maximal Meal Prep: Total Light Housekeeping: Total Transfer Assistance: min-mod assist for transfers to stand pivot Able to Take Stairs?: No Driving: No Vocation: Retired Musician: No difficulties   Vision - Assessment Eye Alignment: Within Media planner  Overall Cognitive Status: History of cognitive impairments - at baseline Arousal/Alertness: Awake/alert Behavior During Session: [facility] for tasks performed    Extremity/Trunk Assessment Right Upper Extremity Assessment RUE ROM/Strength/Tone: [facility] for tasks assessed (Pt reports pain in R wrist with sit to stand) Left Upper Extremity Assessment LUE ROM/Strength/Tone: [facility] for tasks assessed   Mobility Bed Mobility Bed Mobility: Supine to Sit Supine to Sit: 4: Min assist Scooting to HOB: 4: Min assist Transfers Transfers: Sit to Stand;Stand to Sit Sit to Stand: 3: Mod assist;With upper extremity assist;From bed Stand to Sit: 3: Mod assist;With upper extremity assist;To chair/3-in-1         End of Session OT - End of Session Activity Tolerance:  Patient tolerated treatment well Patient left: in chair;with call bell/phone within reach;with chair alarm set  GO  Alba Cory 01/01/2012, 9:57 AM

## 2012-01-01 NOTE — Progress Notes (Signed)
[facility] PROGRESS NOTE  April Baker ZOX:[id number] DOB: October 03, 1932 DOA: 12/27/2011 PCP: Nadean Corwin, MD Cardiologist: Georga Hacking, MD  Assessment/Plan: 1. Atypical chest pain: Resolved. Cardiac enzymes negative. CT angiogram of chest was negative. 2-D echocardiogram noted.  2. New systolic dysfunction: Low-dose beta blocker and ACE inhibitor started. Case discussed with Dr. Donnie Aho who has reviewed the patient's chart and provides additional information: Last echocardiogram on file 08/2010: Left ventricular ejection fraction 50-55%. Mild inferior hypokinesis. Dr. Donnie Aho has reviewed echocardiogram from this hospitalization and feels that the left ventricular ejection fraction is 45-50% and mild hypokinesis is present there is no akinesis. Dr. Donnie Aho has recommended no further investigation for echocardiogram findings. He has recommended low-dose ACE inhibitor and beta blocker as blood pressure can tolerate. He will followup in the office September/2013. 3. Chronic leukocytosis: No evidence of infection. Dates to at least January 2009. Followup as an outpatient as needed. 4. Pulmonary nodule: Consider followup in 1 year. 5. Diabetes mellitus:  Resume Januvia on discharge.  6. Atrial fibrillation: Stable. Continue digoxin. 7. History coronary artery disease: Continue Plavix. Low-dose beta blocker started. 8. Dementia, Parkinson's: At baseline. Continue Seroquel, BuSpar, Xanax and Sinemet.  Stable for discharge today.  Code Status: DO NOT RESUSCITATE Family Communication: None at bedside. No answer at [phone], [phone]. Disposition Plan: ALF today.  Brendia Sacks, MD  [facility] Pager [phone]. If 8PM-8AM, please contact night-coverage at [email or web address], password [facility] 01/01/2012, 12:20 PM  LOS: 5 days   Brief narrative: 78 year old woman arrived the EMS from nursing home for substernal chest pain. She was admitted for atypical chest pain, serial  cardiac enzymes. D-dimer was positive but CT of chest was negative for pulmonary embolism.   Consultants:  None  Procedures:  2-D echocardiogram: LVEF 35%-40%. Akinesis inferior myocardium. Grade 1 diastolic dysfunction. Discussed with Dr. Donnie Aho who has reviewed these images and reports that LVEF is mildly reduced and is closer to 45-50% and that hypokinesis.  Previous echocardiogram from Dr. York Spaniel office--08/2011 LVEF 50-55% mild inferior hypokinesis.  HPI/Subjective: No complaints.  Objective: Filed Vitals:   12/31/11 1425 12/31/11 1947 01/01/12 0405 01/01/12 1028  BP: 131/68 130/69 146/70   Pulse: 68 70 74 79  Temp: 98.2 F (36.8 C) 98.8 F (37.1 C) 97.8 F (36.6 C)   TempSrc: Oral Oral Oral   Resp: 17 18 18    Height:      Weight:   45.7 kg (100 lb 12 oz)   SpO2: 98% 95% 96%     Intake/Output Summary (Last 24 hours) at 01/01/12 1220 Last data filed at 01/01/12 1100  Gross per 24 hour  Intake    460 ml  Output   1200 ml  Net   -740 ml    Exam:   General:  Appears calm and comfortable.  Cardiovascular: Regular rate and rhythm. No murmur, rub, gallop. No lower extremity edema.  Respiratory: Clear to auscultation bilaterally. No wheezes, rales, rhonchi. Normal respiratory effort.  Data Reviewed: Basic Metabolic Panel:  Lab 12/28/11 5284  NA 137  K 3.5  CL 96  CO2 31  GLUCOSE 219*  BUN 26*  CREATININE 1.03  CALCIUM 9.3  MG --  PHOS --   CBC:  Lab 12/29/11 0800 12/28/11 0327 12/28/11 0035  WBC 20.5* 21.6* 26.7*  NEUTROABS -- 3.0 3.7  HGB 12.3 10.8* 11.1*  HCT 36.7 32.4* 33.2*  MCV 84.6 83.7 83.8  PLT 228 210 222   Cardiac Enzymes:  Lab 12/28/11 1958 12/28/11 1217 12/28/11 0505  CKTOTAL 24 25 31   CKMB 1.8 2.0 2.6  CKMBINDEX -- -- --  TROPONINI <0.30 <0.30 <0.30   CBG:  Lab 01/01/12 1134 01/01/12 0602 12/31/11 1123 12/30/11 2038 12/30/11 1617  GLUCAP 337* 150* 306* 339* 165*    Recent Results (from the past 240 hour(s))  MRSA PCR  SCREENING     Status: Normal   Collection Time   12/28/11  6:33 AM      Component Value Range Status Comment   MRSA by PCR NEGATIVE  NEGATIVE Final      Studies:   Scheduled Meds:    . ALPRAZolam  0.25 mg Oral QHS  . aspirin  325 mg Oral Daily  . busPIRone  10 mg Oral TID  . carbidopa-levodopa  1 tablet Oral QID  . carvedilol  3.125 mg Oral BID WC  . clopidogrel  75 mg Oral Daily  . digoxin  125 mcg Oral Daily  . docusate sodium  100 mg Oral BID  . enoxaparin (LOVENOX) injection  30 mg Subcutaneous Q24H  . fenofibrate  160 mg Oral Daily  . furosemide  80 mg Oral BID  . insulin aspart  0-9 Units Subcutaneous TID WC  . linagliptin  5 mg Oral Daily  . magnesium oxide  400 mg Oral Daily  . polyethylene glycol  17 g Oral Daily  . potassium chloride SA  20 mEq Oral Daily  . QUEtiapine  25 mg Oral BID  . sodium chloride  3 mL Intravenous Q12H  . DISCONTD: insulin glargine  10 Units Subcutaneous QHS  . DISCONTD: insulin glargine  5 Units Subcutaneous QHS  . DISCONTD: linagliptin  10 mg Oral Daily   Continuous Infusions:   Principal Problem:  *Chest pain Active Problems:  DM (diabetes mellitus)  A-fib  CAD (coronary artery disease)  Dementia  Parkinson disease  Leukocytosis     Brendia Sacks, MD  [facility] Pager [phone]. If 8PM-8AM, please contact night-coverage at [email or web address], password [facility] 01/01/2012, 12:20 PM  LOS: 5 days

## 2012-01-01 NOTE — Progress Notes (Signed)
Paged Dr. Irene Limbo to see if patient was medically stable for d/c today- he does anticipate her d/c and plans to see her shortly-  RN and daughter advised. Reece Levy, MSW, Theresia Majors [phone]

## 2012-01-01 NOTE — Progress Notes (Signed)
Patient d/c to SNF with daughter discharge instruction review with family. Questions and concerns answered. Education on new medications and appointment.    Brianna Bennett rn

## 2012-01-01 NOTE — Discharge Summary (Signed)
Physician Discharge Summary  TRIVIA HEFFELFINGER ZOX:[id number] DOB: [birth year] DOA: 12/27/2011  PCP: Nadean Corwin, MD Cardiologist: Georga Hacking, MD  Admit date: 12/27/2011 Discharge date: 01/01/2012  Recommendations for Outpatient Follow-up:  1. Continue to follow blood pressure with the addition of new agents--carvedilol and lisinopril. 2. Could consider repeat basic metabolic panel approximately one week with initiation of lisinopril. 3. Keep followup appointment with cardiologist in the future. 4. Physical and occupational therapy recommended--ALF feels comfortable providing. 5. Consider outpatient evaluation for chronic leukocytosis, if clinically indicated. 6. Could consider repeat imaging in one year to follow up pulmonary nodule, if clinically indicated.   Follow-up Information    Follow up with MCKEOWN,WILLIAM DAVID, MD in 1 week.   Contact information:   [address] [place] [address] [phone]         Discharge Diagnoses:  1. Atypical chest pain 2. New systolic dysfunction, asymptomatic, compensated 3. Chronic leukocytosis 4. Pulmonary nodule  Discharge Condition: Improved Disposition: Return to assisted living facility with physical and occupational therapy.  Diet recommendation: Heart healthy, diabetic  History of present illness:  78 year old woman arrived the EMS from nursing home for substernal chest pain. She was admitted for atypical chest pain, serial cardiac enzymes. D-dimer was positive but CT of chest was negative for pulmonary embolism.   Hospital Course:  Ms. Burich was admitted to the medical floor. Serial cardiac enzymes were negative and she had no further chest pain. 2-D echocardiogram did reveal new systolic dysfunction which is felt to be somewhat different from previous studies. This was discussed with her cardiologist who recommended adding low-dose beta blocker and ACE inhibitor but no further  investigation recommended. Stable for discharge. Chronic leukocytosis based on these January 2009. This could be evaluated as an outpatient and felt to be clinically indicated and family will like to pursue. Other issues as outlined below. 1. Atypical chest pain: Resolved. Cardiac enzymes negative. CT angiogram of chest was negative. 2-D echocardiogram noted.    2. New systolic dysfunction: Low-dose beta blocker and ACE inhibitor started. Case discussed with Dr. Donnie Aho who has reviewed the patient's chart and provides additional information: Last echocardiogram on file 08/2010: Left ventricular ejection fraction 50-55%. Mild inferior hypokinesis. Dr. Donnie Aho has reviewed echocardiogram from this hospitalization and feels that the left ventricular ejection fraction is 45-50% and mild hypokinesis is present there is no akinesis. Dr. Donnie Aho has recommended no further investigation for echocardiogram findings. He has recommended low-dose ACE inhibitor and beta blocker as blood pressure can tolerate. He will followup in the office September/2013.  3. Chronic leukocytosis: No evidence of infection. Dates to at least January 2009. Followup as an outpatient as needed.  4. Pulmonary nodule: Consider followup in 1 year.  5. Diabetes mellitus:  Resume Januvia on discharge.    6. Atrial fibrillation: Stable. Continue digoxin.  7. History coronary artery disease: Continue Plavix. Low-dose beta blocker started.  8. Dementia, Parkinson's: At baseline. Continue Seroquel, BuSpar, Xanax and Sinemet.  Consultants:  None  Procedures:  2-D echocardiogram: LVEF 35%-40%. Akinesis inferior myocardium. Grade 1 diastolic dysfunction. Discussed with Dr. Donnie Aho who has reviewed these images and reports that LVEF is mildly reduced and is closer to 45-50% and that hypokinesis.   Previous echocardiogram from Dr. York Spaniel office--08/2011 LVEF 50-55% mild inferior hypokinesis.  Discharge Instructions  Discharge Orders    Future  Orders Please Complete By Expires   Diet - low sodium heart healthy      Increase activity slowly  Discharge instructions      Comments:   1. New medications are carvedilol and lisinopril. These medications are for your heart and blood pressure. 2. Keep your followup appointment with your cardiologist.     Medication List  As of 01/01/2012 12:32 PM   STOP taking these medications         traMADol 50 MG tablet         TAKE these medications         ALPRAZolam 0.25 MG tablet   Commonly known as: XANAX   Take 1 tablet (0.25 mg total) by mouth at bedtime.      busPIRone 10 MG tablet   Commonly known as: BUSPAR   Take 10 mg by mouth 3 (three) times daily.      carbidopa-levodopa 25-100 MG per tablet   Commonly known as: SINEMET IR   Take 1 tablet by mouth 4 (four) times daily. 8 am, 12 pm, 4 pm, 9 pm      carvedilol 3.125 MG tablet   Commonly known as: COREG   Take 1 tablet (3.125 mg total) by mouth 2 (two) times daily with a meal.      cholecalciferol 1000 UNITS tablet   Commonly known as: VITAMIN D   Take 2,000 Units by mouth daily.      clopidogrel 75 MG tablet   Commonly known as: PLAVIX   Take 75 mg by mouth daily.      digoxin 0.125 MG tablet   Commonly known as: LANOXIN   Take 125 mcg by mouth daily.      docusate sodium 100 MG capsule   Commonly known as: COLACE   Take 100 mg by mouth 2 (two) times daily.      fenofibrate micronized 134 MG capsule   Commonly known as: LOFIBRA   Take 134 mg by mouth daily before breakfast.      fish oil-omega-3 fatty acids 1000 MG capsule   Take 1 g by mouth daily.      Flax Seed Oil 1000 MG Caps   Take 1 capsule by mouth daily.      furosemide 80 MG tablet   Commonly known as: LASIX   Take 80 mg by mouth 2 (two) times daily.      lisinopril 2.5 MG tablet   Commonly known as: PRINIVIL,ZESTRIL   Take 1 tablet (2.5 mg total) by mouth daily.      Lutein 20 MG Tabs   Take 1 tablet by mouth daily.      magnesium  oxide 400 MG tablet   Commonly known as: MAG-OX   Take 400 mg by mouth daily.      nitroGLYCERIN 0.4 MG SL tablet   Commonly known as: NITROSTAT   Place 0.4 mg under the tongue every 5 (five) minutes as needed. Chest pain      polyethylene glycol packet   Commonly known as: MIRALAX / GLYCOLAX   Take 17 g by mouth daily.      potassium chloride SA 20 MEQ tablet   Commonly known as: K-DUR,KLOR-CON   Take 20 mEq by mouth daily.      QUEtiapine 25 MG tablet   Commonly known as: SEROQUEL   Take 25 mg by mouth 2 (two) times daily.      sitaGLIPtin 50 MG tablet   Commonly known as: JANUVIA   Take 50 mg by mouth daily.           The results of significant diagnostics  from this hospitalization (including imaging, microbiology, ancillary and laboratory) are listed below for reference.    Significant Diagnostic Studies: Dg Chest 2 View  12/28/2011  *RADIOLOGY REPORT*  Clinical Data: Chest pain  CHEST - 2 VIEW  Comparison: 05/14/2011  Findings: Moderate cardiomegaly.  Normal vascularity.  Postop changes.  No pleural effusion.  No pneumothorax.  No consolidation.  IMPRESSION: Cardiomegaly without edema.  Original Report Authenticated By: Donavan Burnet, M.D.   Ct Angio Chest W/cm &/or Wo Cm  12/28/2011  *RADIOLOGY REPORT*  Clinical Data: Chest pain since last evening  CT ANGIOGRAPHY CHEST  Technique:  Multidetector CT imaging of the chest using the standard protocol during bolus administration of intravenous contrast. Multiplanar reconstructed images including MIPs were obtained and reviewed to evaluate the vascular anatomy.  Contrast: OMNIPAQUE IOHEXOL 350 MG/ML SOLN  Comparison: CT thorax 03/30 1012  Findings: There is no filling defects within the pulmonary arteries to suggest acute pulmonary embolism.  No acute findings of the aorta or great vessels.  No pericardial fluid.  Coronary calcifications present.  There is gas in the esophagus small amount of high density fluid which may  represent refluxed liquid oral medication or barium.  No acute pulmonary parenchymal abnormality.  No evidence of pneumothorax, pleural fluid or pneumonia. There is a small pulmonary nodule in the right upper lobe measuring 3 mm (image 50).  Limited view of the upper abdomen is unremarkable.  The spleen may be enlarged.  Limited view of the skeleton shows no acute findings.  IMPRESSION:  1.  No acute pulmonary embolism. 2.  Coronary calcifications.  3.  Small amount high density fluid within the esophagus may represent refluxed oral medication.  4.  Small right upper lobe pulmonary nodule. If the patient is at high risk for bronchogenic carcinoma, follow-up chest CT at 1 year is recommended.  If the patient is at low risk, no follow-up is needed.  This recommendation follows the consensus statement: Guidelines for Management of Small Pulmonary Nodules Detected on CT Scans:  A Statement from the Fleischner Society as published in Radiology 2005; 237:395-400. 6.  Although the spleen is not entirely imaged if appears mildly enlarged.  Original Report Authenticated By: Genevive Bi, M.D.   Microbiology: Recent Results (from the past 240 hour(s))  MRSA PCR SCREENING     Status: Normal   Collection Time   12/28/11  6:33 AM      Component Value Range Status Comment   MRSA by PCR NEGATIVE  NEGATIVE Final     Labs: Basic Metabolic Panel:  Lab 12/28/11 1610  NA 137  K 3.5  CL 96  CO2 31  GLUCOSE 219*  BUN 26*  CREATININE 1.03  CALCIUM 9.3  MG --  PHOS --   CBC:  Lab 12/29/11 0800 12/28/11 0327 12/28/11 0035  WBC 20.5* 21.6* 26.7*  NEUTROABS -- 3.0 3.7  HGB 12.3 10.8* 11.1*  HCT 36.7 32.4* 33.2*  MCV 84.6 83.7 83.8  PLT 228 210 222   Cardiac Enzymes:  Lab 12/28/11 1958 12/28/11 1217 12/28/11 0505  CKTOTAL 24 25 31   CKMB 1.8 2.0 2.6  CKMBINDEX -- -- --  TROPONINI <0.30 <0.30 <0.30   CBG:  Lab 01/01/12 1134 01/01/12 0602 12/31/11 1123 12/30/11 2038 12/30/11 1617  GLUCAP 337* 150*  306* 339* 165*    Principal Problem:  *Chest pain Active Problems:  DM (diabetes mellitus)  A-fib  CAD (coronary artery disease)  Dementia  Parkinson disease  Leukocytosis   Time  coordinating discharge: 20 minutes.  Signed:  Brendia Sacks, MD [facility] 01/01/2012, 12:32 PM

## 2012-01-02 LAB — GLUCOSE, CAPILLARY: Glucose-Capillary: 319 mg/dL — ABNORMAL HIGH (ref 70–99)

## 2012-01-16 NOTE — Progress Notes (Signed)
Late entry based on Lori Redding, OTR/L evaluation on 01/08/12.  01-08-12 0957  OT G-codes **NOT FOR INPATIENT CLASS**  Functional Assessment Tool Used Clinical judgement based off of chart review  Functional Limitation Self care  Self Care Current Status (Z6109) CM  Self Care Goal Status (U0454) CI  OT General Charges  $OT Visit 1 Procedure  OT Evaluation  $Initial OT Evaluation Tier II 1 Procedure   Ignacia Palma, OTR/L [phone] 01/16/2012

## 2012-01-16 NOTE — Progress Notes (Addendum)
PT Late Entry on 01-25-2012 1138 for Jan 07, 2012:  Reviewed medical record including Maija Prater's PT evaluation on 01/07/12 to determine G-Codes   01-25-12 1100  PT G-Codes **NOT FOR INPATIENT CLASS**  Functional Assessment Tool Used Clicical judgement based on review of medical record  Functional Limitation Mobility: Walking and moving around  Mobility: Walking and Moving Around Current Status [phone]) CK  Mobility: Walking and Moving Around Goal Status [phone]) Cristy Folks, PT  [phone] 01-25-12

## 2012-02-06 ENCOUNTER — Inpatient Hospital Stay (HOSPITAL_COMMUNITY)
Admission: EM | Admit: 2012-02-06 | Discharge: 2012-02-11 | DRG: 682 | Disposition: A | Discharge status: Skilled Nursing Facility | Payer: Medicare Other | Attending: Internal Medicine | Admitting: Internal Medicine

## 2012-02-06 ENCOUNTER — Encounter (HOSPITAL_COMMUNITY): Payer: Self-pay | Admitting: Emergency Medicine

## 2012-02-06 DIAGNOSIS — E46 Unspecified protein-calorie malnutrition: Secondary | ICD-10-CM | POA: Diagnosis present

## 2012-02-06 DIAGNOSIS — N179 Acute kidney failure, unspecified: Principal | ICD-10-CM

## 2012-02-06 DIAGNOSIS — I959 Hypotension, unspecified: Secondary | ICD-10-CM | POA: Diagnosis present

## 2012-02-06 DIAGNOSIS — Z79899 Other long term (current) drug therapy: Secondary | ICD-10-CM

## 2012-02-06 DIAGNOSIS — F0390 Unspecified dementia without behavioral disturbance: Secondary | ICD-10-CM

## 2012-02-06 DIAGNOSIS — G20A1 Parkinson's disease without dyskinesia, without mention of fluctuations: Secondary | ICD-10-CM

## 2012-02-06 DIAGNOSIS — E876 Hypokalemia: Secondary | ICD-10-CM

## 2012-02-06 DIAGNOSIS — E1149 Type 2 diabetes mellitus with other diabetic neurological complication: Secondary | ICD-10-CM | POA: Diagnosis present

## 2012-02-06 DIAGNOSIS — F0280 Dementia in other diseases classified elsewhere without behavioral disturbance: Secondary | ICD-10-CM | POA: Diagnosis present

## 2012-02-06 DIAGNOSIS — I252 Old myocardial infarction: Secondary | ICD-10-CM

## 2012-02-06 DIAGNOSIS — I2510 Atherosclerotic heart disease of native coronary artery without angina pectoris: Secondary | ICD-10-CM | POA: Diagnosis present

## 2012-02-06 DIAGNOSIS — Z681 Body mass index (BMI) 19 or less, adult: Secondary | ICD-10-CM

## 2012-02-06 DIAGNOSIS — E860 Dehydration: Secondary | ICD-10-CM

## 2012-02-06 DIAGNOSIS — Z66 Do not resuscitate: Secondary | ICD-10-CM | POA: Diagnosis present

## 2012-02-06 DIAGNOSIS — F068 Other specified mental disorders due to known physiological condition: Secondary | ICD-10-CM | POA: Diagnosis present

## 2012-02-06 DIAGNOSIS — E1142 Type 2 diabetes mellitus with diabetic polyneuropathy: Secondary | ICD-10-CM | POA: Diagnosis present

## 2012-02-06 DIAGNOSIS — D72829 Elevated white blood cell count, unspecified: Secondary | ICD-10-CM

## 2012-02-06 DIAGNOSIS — I4891 Unspecified atrial fibrillation: Secondary | ICD-10-CM

## 2012-02-06 DIAGNOSIS — D638 Anemia in other chronic diseases classified elsewhere: Secondary | ICD-10-CM

## 2012-02-06 DIAGNOSIS — I10 Essential (primary) hypertension: Secondary | ICD-10-CM | POA: Diagnosis present

## 2012-02-06 DIAGNOSIS — N390 Urinary tract infection, site not specified: Secondary | ICD-10-CM

## 2012-02-06 DIAGNOSIS — E119 Type 2 diabetes mellitus without complications: Secondary | ICD-10-CM

## 2012-02-06 DIAGNOSIS — G20 Parkinson's disease: Secondary | ICD-10-CM | POA: Diagnosis present

## 2012-02-06 DIAGNOSIS — G3183 Dementia with Lewy bodies: Secondary | ICD-10-CM | POA: Diagnosis present

## 2012-02-06 DIAGNOSIS — R532 Functional quadriplegia: Secondary | ICD-10-CM

## 2012-02-06 LAB — BASIC METABOLIC PANEL
CO2: 28 mEq/L (ref 19–32)
Calcium: 9.3 mg/dL (ref 8.4–10.5)
GFR calc non Af Amer: 26 mL/min — ABNORMAL LOW (ref >90)
Sodium: 133 mEq/L — ABNORMAL LOW (ref 135–145)

## 2012-02-06 LAB — URINALYSIS, ROUTINE W REFLEX MICROSCOPIC
Bilirubin Urine: NEGATIVE
Nitrite: NEGATIVE
Specific Gravity, Urine: 1.016 (ref 1.005–1.030)
Urobilinogen, UA: 0.2 mg/dL (ref 0.0–1.0)

## 2012-02-06 LAB — HEPATIC FUNCTION PANEL
ALT: <5 U/L (ref 0–35)
AST: 25 U/L (ref 0–37)
Alkaline Phosphatase: 43 U/L (ref 39–117)
Bilirubin, Direct: <0.1 mg/dL (ref 0.0–0.3)
Total Bilirubin: 0.3 mg/dL (ref 0.3–1.2)

## 2012-02-06 LAB — CBC
MCH: 27.8 pg (ref 26.0–34.0)
Platelets: 286 10*3/uL (ref 150–400)
RBC: 3.85 MIL/uL — ABNORMAL LOW (ref 3.87–5.11)

## 2012-02-06 LAB — POCT I-STAT TROPONIN I: Troponin i, poc: 0.01 ng/mL (ref 0.00–0.08)

## 2012-02-06 LAB — CK TOTAL AND CKMB (NOT AT ARMC): CK, MB: 2.9 ng/mL (ref 0.3–4.0)

## 2012-02-06 LAB — DIGOXIN LEVEL: Digoxin Level: 0.7 ng/mL — ABNORMAL LOW (ref 0.8–2.0)

## 2012-02-06 MED ORDER — SODIUM CHLORIDE 0.9 % IV BOLUS (SEPSIS)
250.0000 mL | Freq: Once | INTRAVENOUS | Status: AC
  Administered 2012-02-06: 250 mL via INTRAVENOUS

## 2012-02-06 MED ORDER — DEXTROSE 5 % IV SOLN
2.0000 g | Freq: Once | INTRAVENOUS | Status: AC
  Administered 2012-02-06: 2 g via INTRAVENOUS
  Filled 2012-02-06: qty 2

## 2012-02-06 NOTE — ED Notes (Signed)
Pt transferred to 3rd floor.

## 2012-02-06 NOTE — ED Notes (Signed)
EKG given to EDP, Bonk, MD. 

## 2012-02-06 NOTE — ED Notes (Signed)
Pt reports burning with urination and urinary frequency/urgency. NAD noted at this time.

## 2012-02-06 NOTE — ED Notes (Signed)
April Baker (daughter, [place]): [phone],   [phone],     [phone]

## 2012-02-06 NOTE — ED Notes (Signed)
Pt alert, arrives from [facility], sent per Lab work, pt found to be in ARF, resp even unlabored, skin pwd

## 2012-02-06 NOTE — ED Provider Notes (Signed)
History     CSN: [id number]  Arrival date & time 02/06/12  1836   First MD Initiated Contact with Patient 02/06/12 2007      Chief Complaint  Patient presents with  . Sent per Lab work, ARF     (Consider location/radiation/quality/duration/timing/severity/associated sxs/prior treatment) The history is provided by the patient and a caregiver.   April Baker is a 78 y.o. female has an extensive PMH, but most recently was admitted for chest pain and had a CT with PE protocol 12/27/2011, also has h/o Parkinson's and dementia.  Pt is switching physicians and had routine lab work drawn two days ago, pt and family were notified today to go to an ER because of acute renal failure.  On accompanying paperwork, BMP is as follows Na 139, K 4.0, Cl 95, CO2 28, Glucose 104, BUN 85, Creatinine 3.01, Calcium 9.7 with GFR of 14.  Pt has been more "sleepy" recently, and say she has had a recent "cold" with rhinorrhea, but otherwise denies chest pain, productive cough, abdominal pain, nausea, vomiting, diarrhea fever or chills.  Her daughter says when visting with her mother last night, she seemed a little tired, but otherwise normal.  No other complaints per [facility].  Past Medical History  Diagnosis Date  . Hypertension   . Atrial fibrillation   . Parkinson's disease   . Myocardial infarction   . Coronary artery disease   . Hyperlipidemia   . Dementia   . Diabetes mellitus   . Anxiety   . MVP (mitral valve prolapse)     History of  . Unilateral carotid artery stenosis   . Wrist fracture, right   . Peripheral neuropathy   . Lower extremity numbness     Past Surgical History  Procedure Date  . Inner ear surgery 1985    with metal placement-no mri  . Cesarean section 1964  . Wrist reconstruction 1991  . Anal fistulotomy 08/12/2000  . Coronary artery bypass graft   . Cardiac catheterization   . Sternal wire removal 10/29/2003    I&D of 2 upper sternal wires    No family history on  file.  History  Substance Use Topics  . Smoking status: Never Smoker   . Smokeless tobacco: Never Used  . Alcohol Use: No    OB History    Grav Para Term Preterm Abortions TAB SAB Ect Mult Living                  Review of Systems GENERAL: No fevers or chills, weight loss. Positive for fatigue. HEENT: No change in vision, sinus congestion. NECK: No pain or stiffness. CARDIOVASCULAR: No chest pain or pressure, palpitations, syncope. PULMONARY: No shortness of breath, cough or wheeze. GASTROINTESTINAL: No abdominal pain, nausea, vomiting or diarrhea GENITOURINARY: No urinary frequency, urgency, hesitancy.  Positive for mild dysuria. MUSCULOSKELETAL: No joint or muscle pain, no back pain, no recent trauma. DERMATOLOGIC: No rash, no itching, no lesions. NEUROLOGIC: No headache, numbness, tingling or weakness.   Allergies  Latex  Home Medications   Current Outpatient Rx  Name Route Sig Dispense Refill  . ALPRAZOLAM 0.25 MG PO TABS Oral Take 0.25 mg by mouth at bedtime.    . BUSPIRONE HCL 10 MG PO TABS Oral Take 10 mg by mouth 3 (three) times daily.      Marland Kitchen CARBIDOPA-LEVODOPA 25-100 MG PO TABS Oral Take 1 tablet by mouth 4 (four) times daily. 8 am, 12 pm, 4 pm, 9 pm    .  CARVEDILOL 3.125 MG PO TABS Oral Take 3.125 mg by mouth 2 (two) times daily with a meal.    . VITAMIN D 1000 UNITS PO TABS Oral Take 1,000 Units by mouth daily.     Marland Kitchen CLOPIDOGREL BISULFATE 75 MG PO TABS Oral Take 75 mg by mouth daily.      Marland Kitchen DIGOXIN 0.125 MG PO TABS Oral Take 125 mcg by mouth daily.      Marland Kitchen DOCUSATE SODIUM 100 MG PO CAPS Oral Take 100 mg by mouth 2 (two) times daily.      . FUROSEMIDE 80 MG PO TABS Oral Take 80 mg by mouth 2 (two) times daily.     Marland Kitchen LISINOPRIL 2.5 MG PO TABS Oral Take 2.5 mg by mouth daily.    Marland Kitchen POLYETHYLENE GLYCOL 3350 PO PACK Oral Take 17 g by mouth daily.      Marland Kitchen POTASSIUM CHLORIDE CRYS ER 20 MEQ PO TBCR Oral Take 20 mEq by mouth daily.      . QUETIAPINE FUMARATE 25 MG PO TABS Oral  Take 25 mg by mouth 2 (two) times daily.    Marland Kitchen SITAGLIPTIN PHOSPHATE 50 MG PO TABS Oral Take 50 mg by mouth daily.        BP 96/59  Pulse 68  Temp 98.1 F (36.7 C) (Oral)  Resp 14  SpO2 98%  Physical Exam  Constitutional: She is oriented to person, place, and time. She appears well-developed and well-nourished.       Frail appearing. Somnolent - nods off in wheelchair.  Eyes: Conjunctivae and EOM are normal. Pupils are equal, round, and reactive to light.  Neck: Normal range of motion. Neck supple.  Cardiovascular: Normal rate, regular rhythm and intact distal pulses.   Murmur heard.  Systolic murmur is present with a grade of 3/6       Loud 3/6 murmur at left sternal border.  Pulmonary/Chest: Effort normal and breath sounds normal.  Abdominal: Soft. Bowel sounds are normal.  Musculoskeletal: Normal range of motion.  Neurological: She is alert and oriented to person, place, and time. No cranial nerve deficit or sensory deficit. She exhibits normal muscle tone. Coordination normal.       Able to walk unassisted.    Skin: Skin is warm and dry.    ED Course  Procedures (including critical care time)  Labs Reviewed  URINALYSIS, ROUTINE W REFLEX MICROSCOPIC - Abnormal; Notable for the following:    APPearance CLOUDY (*)     Glucose, UA >1000 (*)     Leukocytes, UA LARGE (*)     All other components within normal limits  CBC - Abnormal; Notable for the following:    WBC 22.1 (*)     RBC 3.85 (*)     Hemoglobin 10.7 (*)     HCT 31.7 (*)     All other components within normal limits  BASIC METABOLIC PANEL - Abnormal; Notable for the following:    Sodium 133 (*)     Chloride 90 (*)     Glucose, Bld 209 (*)     BUN 78 (*)     Creatinine, Ser 1.80 (*)     GFR calc non Af Amer 26 (*)     GFR calc Af Amer 30 (*)     All other components within normal limits  DIGOXIN LEVEL - Abnormal; Notable for the following:    Digoxin Level 0.7 (*)     All other components within normal  limits  URINE  MICROSCOPIC-ADD ON - Abnormal; Notable for the following:    Bacteria, UA MANY (*)     All other components within normal limits  CK TOTAL AND CKMB  HEPATIC FUNCTION PANEL  POCT I-STAT TROPONIN I  SODIUM, URINE, RANDOM  CREATININE, URINE, RANDOM  OSMOLALITY  BETA-HYDROXYBUTYRIC ACID   No results found.   No diagnosis found.    MDM  April Baker is a 78 y.o. female with complex medical history with concern for ARF - could be secondary to IV Contrast, progressive heart failure, dehydration, medication, patient also had elevated A1C on lab work - could have mild DKA or glucose induced diuresis.  Pt also endorses mild dysuria - also consider UTI, early sepsis.  Currently she is hemodynamically stable.  Will also check EKG, Digoxin level and cardiac biomarkers with basic labs.  Lab results show a mild hyponatremia likely secondary to slightly elevated glucose.  UA indicated UTI - rocephin started, BC not indicated.  Renal function is poor, but better than prior labs with BUN 78 and Creat 1.8.  Pt somnolence make be due in part to uremia.  Pt has leuckocytosis, this appears to be chronic and is non-specific.  EKG shows a sinus rhythm, normal rate, normal rhythm, baseline noise - likely secondary to tremor, no ST or T wave abnormalities c/w acute ischemia or infarction.  She does have inverted T's in V2 and V3 which were seen on prior EKG.  Will admit pt for hydration, IV antibiotics.  11:16 PM D/W Dr Conley Rolls for admission for ARF, UTI, and mild dehydration.      Jones Skene, MD 02/06/12 2356

## 2012-02-07 DIAGNOSIS — N179 Acute kidney failure, unspecified: Secondary | ICD-10-CM

## 2012-02-07 DIAGNOSIS — N390 Urinary tract infection, site not specified: Secondary | ICD-10-CM | POA: Diagnosis present

## 2012-02-07 DIAGNOSIS — E876 Hypokalemia: Secondary | ICD-10-CM | POA: Diagnosis present

## 2012-02-07 DIAGNOSIS — E860 Dehydration: Secondary | ICD-10-CM | POA: Diagnosis present

## 2012-02-07 DIAGNOSIS — E119 Type 2 diabetes mellitus without complications: Secondary | ICD-10-CM

## 2012-02-07 DIAGNOSIS — I4891 Unspecified atrial fibrillation: Secondary | ICD-10-CM

## 2012-02-07 LAB — BASIC METABOLIC PANEL
Calcium: 8.8 mg/dL (ref 8.4–10.5)
GFR calc Af Amer: 35 mL/min — ABNORMAL LOW (ref >90)
GFR calc non Af Amer: 30 mL/min — ABNORMAL LOW (ref >90)
Glucose, Bld: 129 mg/dL — ABNORMAL HIGH (ref 70–99)
Potassium: 2.9 mEq/L — ABNORMAL LOW (ref 3.5–5.1)
Sodium: 138 mEq/L (ref 135–145)

## 2012-02-07 LAB — GLUCOSE, CAPILLARY
Glucose-Capillary: 116 mg/dL — ABNORMAL HIGH (ref 70–99)
Glucose-Capillary: 217 mg/dL — ABNORMAL HIGH (ref 70–99)
Glucose-Capillary: 152 mg/dL — ABNORMAL HIGH (ref 70–99)

## 2012-02-07 LAB — CBC
Hemoglobin: 9.6 g/dL — ABNORMAL LOW (ref 12.0–15.0)
MCH: 28.2 pg (ref 26.0–34.0)
MCHC: 34.2 g/dL (ref 30.0–36.0)
Platelets: 245 10*3/uL (ref 150–400)
RBC: 3.41 MIL/uL — ABNORMAL LOW (ref 3.87–5.11)

## 2012-02-07 LAB — URINE CULTURE: Special Requests: NORMAL

## 2012-02-07 LAB — SODIUM, URINE, RANDOM: Sodium, Ur: 40 mEq/L

## 2012-02-07 LAB — MRSA PCR SCREENING: MRSA by PCR: NEGATIVE

## 2012-02-07 MED ORDER — CARBIDOPA-LEVODOPA 25-100 MG PO TABS
1.0000 | ORAL_TABLET | Freq: Four times a day (QID) | ORAL | Status: DC
  Administered 2012-02-07 – 2012-02-11 (×17): 1 via ORAL
  Filled 2012-02-07 (×20): qty 1

## 2012-02-07 MED ORDER — LIDOCAINE HCL 2 % IJ SOLN
INTRAMUSCULAR | Status: AC
  Filled 2012-02-07: qty 1

## 2012-02-07 MED ORDER — GLUCERNA SHAKE PO LIQD
237.0000 mL | Freq: Two times a day (BID) | ORAL | Status: DC
  Administered 2012-02-08 (×2): 237 mL via ORAL
  Filled 2012-02-07 (×2): qty 237

## 2012-02-07 MED ORDER — DEXTROSE 5 % IV SOLN
1.0000 g | INTRAVENOUS | Status: DC
  Administered 2012-02-07 – 2012-02-10 (×4): 1 g via INTRAVENOUS
  Filled 2012-02-07 (×5): qty 10

## 2012-02-07 MED ORDER — CARVEDILOL 3.125 MG PO TABS
3.1250 mg | ORAL_TABLET | Freq: Two times a day (BID) | ORAL | Status: DC
  Administered 2012-02-07 – 2012-02-08 (×3): 3.125 mg via ORAL
  Filled 2012-02-07 (×11): qty 1

## 2012-02-07 MED ORDER — LINAGLIPTIN 5 MG PO TABS
5.0000 mg | ORAL_TABLET | Freq: Every day | ORAL | Status: DC
  Administered 2012-02-07 – 2012-02-11 (×5): 5 mg via ORAL
  Filled 2012-02-07 (×5): qty 1

## 2012-02-07 MED ORDER — DOCUSATE SODIUM 100 MG PO CAPS
100.0000 mg | ORAL_CAPSULE | Freq: Two times a day (BID) | ORAL | Status: DC
  Administered 2012-02-07 – 2012-02-11 (×9): 100 mg via ORAL
  Filled 2012-02-07 (×11): qty 1

## 2012-02-07 MED ORDER — VITAMIN D3 25 MCG (1000 UNIT) PO TABS
1000.0000 [IU] | ORAL_TABLET | Freq: Every day | ORAL | Status: DC
  Administered 2012-02-07 – 2012-02-11 (×5): 1000 [IU] via ORAL
  Filled 2012-02-07 (×5): qty 1

## 2012-02-07 MED ORDER — BUSPIRONE HCL 10 MG PO TABS
10.0000 mg | ORAL_TABLET | Freq: Three times a day (TID) | ORAL | Status: DC
  Administered 2012-02-07 – 2012-02-11 (×13): 10 mg via ORAL
  Filled 2012-02-07 (×15): qty 1

## 2012-02-07 MED ORDER — INSULIN ASPART 100 UNIT/ML ~~LOC~~ SOLN
0.0000 [IU] | Freq: Three times a day (TID) | SUBCUTANEOUS | Status: DC
  Administered 2012-02-07: 2 [IU] via SUBCUTANEOUS
  Administered 2012-02-07: 3 [IU] via SUBCUTANEOUS
  Administered 2012-02-08: 2 [IU] via SUBCUTANEOUS
  Administered 2012-02-09: 5 [IU] via SUBCUTANEOUS
  Administered 2012-02-09: 2 [IU] via SUBCUTANEOUS
  Administered 2012-02-10: 1 [IU] via SUBCUTANEOUS
  Administered 2012-02-10: 2 [IU] via SUBCUTANEOUS
  Administered 2012-02-11: 1 [IU] via SUBCUTANEOUS

## 2012-02-07 MED ORDER — CLOPIDOGREL BISULFATE 75 MG PO TABS
75.0000 mg | ORAL_TABLET | Freq: Every day | ORAL | Status: DC
  Administered 2012-02-07 – 2012-02-11 (×5): 75 mg via ORAL
  Filled 2012-02-07 (×7): qty 1

## 2012-02-07 MED ORDER — POTASSIUM CHLORIDE 10 MEQ/100ML IV SOLN
10.0000 meq | INTRAVENOUS | Status: AC
  Administered 2012-02-07 (×3): 10 meq via INTRAVENOUS
  Filled 2012-02-07 (×3): qty 100

## 2012-02-07 MED ORDER — DIGOXIN 125 MCG PO TABS
125.0000 ug | ORAL_TABLET | Freq: Every day | ORAL | Status: DC
  Administered 2012-02-07 – 2012-02-11 (×5): 125 ug via ORAL
  Filled 2012-02-07 (×5): qty 1

## 2012-02-07 MED ORDER — HEPARIN SODIUM (PORCINE) 5000 UNIT/ML IJ SOLN
5000.0000 [IU] | Freq: Three times a day (TID) | INTRAMUSCULAR | Status: DC
  Administered 2012-02-07 – 2012-02-11 (×12): 5000 [IU] via SUBCUTANEOUS
  Filled 2012-02-07 (×16): qty 1

## 2012-02-07 MED ORDER — POLYETHYLENE GLYCOL 3350 17 G PO PACK
17.0000 g | PACK | Freq: Every day | ORAL | Status: DC
  Administered 2012-02-07 – 2012-02-10 (×4): 17 g via ORAL
  Filled 2012-02-07 (×5): qty 1

## 2012-02-07 MED ORDER — SODIUM CHLORIDE 0.9 % IV SOLN
INTRAVENOUS | Status: DC
  Administered 2012-02-07 – 2012-02-10 (×5): via INTRAVENOUS

## 2012-02-07 NOTE — Progress Notes (Signed)
Clinical Social Work Department BRIEF PSYCHOSOCIAL ASSESSMENT 02/07/2012  Patient:  April Baker, April Baker     Account Number:  [id number]     Admit date:  02/06/2012  Clinical Social Worker:  Skip Mayer  Date/Time:  02/07/2012 10:30 AM  Referred by:  RN  Date Referred:  02/07/2012 Referred for  ALF Placement   Other Referral:   Interview type:  Family Other interview type:    PSYCHOSOCIAL DATA Living Status:  FACILITY Admitted from facility:  [facility] Level of care:  Assisted Living Primary support name:  Greggory Stallion Ann/dtr/[phone] (c) Primary support relationship to patient:  CHILD, ADULT Degree of support available:   Adequate    CURRENT CONCERNS Current Concerns  Post-Acute Placement   Other Concerns:    SOCIAL WORK ASSESSMENT / PLAN CSW spoke with pt's dtr/POA via phone [phone] re: d/c plan.  Pt admitted from [facility] and plan is for return at d/c, per dtr.  CSW confirmed with admissions director at [facility], [employer], pt is able to return when stable.  CSW to complete FL2 and place on chart for MD signature.   Assessment/plan status:  Information/Referral to [facility] Other assessment/ plan:   Information/referral to community resources:   ALF    PATIENT'S/FAMILY'S RESPONSE TO PLAN OF CARE: Pt with some confusion at time of visit.  Pt's dtr reports pt's with positive experience at current ALF and plan is for return.  CSW will continue to follow and assist with d/c when stable.        Dellie Burns, MSW, [place] [phone] (coverage)

## 2012-02-07 NOTE — Progress Notes (Signed)
Addendum to Admission note done 02/07/2012  Subjective: Patient was seen and examined at bedside. I have reviewed labs below.  Assessment and Plan: 78 year old female with multiple medical issues transferred from NH for acute kidney injury. Patient was found to have UTI on this admission.  Principal Problem: Acute kidney injury - likely due to dehydration, UTI - we will continue Rocephin - follow up urine culture results  Active Problems: Parkinson's disease - continue sinemet  Atrial fibrillation - continue digoxin and coreg  Hypokalemia - replete - follow up BMP in am  Diabetes Mellitus - continue linagliptin and sliding scale insulin  Disposition: PT evaluation - pending  Manson Passey [facility] Pager: [phone]  Objective: Vital signs in last 24 hours: Temp:  [98.1 F (36.7 C)-99.9 F (37.7 C)] 99.9 F (37.7 C) (08/22 0609) Pulse Rate:  [62-68] 64  (08/22 0609) Resp:  [14-16] 16  (08/22 0609) BP: (94-132)/(29-59) 114/58 mmHg (08/22 0609) SpO2:  [97 %-100 %] 97 % (08/22 0609) Weight:  [45.7 kg (100 lb 12 oz)] 45.7 kg (100 lb 12 oz) (08/22 0200) Weight change:  Last BM Date:  (PTA; pt states within the past week)  Physical Exam: GEN: no acute distress CVS: S1, S2, irregular rhythm, rate controlled Respiratory: clear to ausculation bilaterally ABD: no tenderness appreciated, (+) BS  Lab Results:  [facility] 02/07/12 0354 02/06/12 2127  WBC 19.4* 22.1*  HGB 9.6* 10.7*  HCT 28.1* 31.7*  PLT 245 286   BMET  Basename 02/07/12 0354 02/06/12 2127  NA 138 133*  K 2.9* 3.8  CL 98 90*  CO2 28 28  GLUCOSE 129* 209*  BUN 66* 78*  CREATININE 1.58* 1.80*  CALCIUM 8.8 9.3      . busPIRone  10 mg Oral TID  . carbidopa-levodopa  1 tablet Oral QID  . carvedilol  3.125 mg Oral BID WC  . cefTRIAXone   1 g Intravenous Q24H  . cholecalciferol  1,000 Unit Oral Daily  . clopidogrel  75 mg Oral Daily  . digoxin  125 mcg Oral Daily  . docusate sodium  100 mg Oral BID  .  heparin  5,000 Units Subcutaneous Q8H  . insulin aspart  0-9 Units Subcutaneous TID WC  . linagliptin  5 mg Oral Daily  . polyethylene glycol  17 g Oral Daily  . potassium chloride  10 mEq Intravenous Q1 Hr x 3

## 2012-02-07 NOTE — Evaluation (Signed)
Physical Therapy Evaluation Patient Details Name: April Baker MRN: [id number] DOB: 11/29/1932 Today's Date: 02/07/2012 Time: 5621-3086 PT Time Calculation (min): 25 min  PT Assessment / Plan / Recommendation Clinical Impression  78 yo female admitted with UTI from [facility].  She has been requiring assistance for transfers and used a wheelchair for locomotion. Pt is likely at that level.  If [facility] can continue that level of care, recommend d/c there with trial of HHPT there.  If they are unable to provide that level of care, recommend SNF or 24/7 care at [facility] assist level with follow up PT    PT Assessment  Patient needs continued PT services    Follow Up Recommendations  Home health PT;Skilled nursing facility    Barriers to Discharge        Equipment Recommendations  None recommended by PT    Recommendations for Other Services OT consult   Frequency Min 3X/week (trial of PT)    Precautions / Restrictions     Pertinent Vitals/Pain Pt with no c/o pain.  Easily agitated with PT      Mobility  Bed Mobility Bed Mobility: Rolling Right;Rolling Left Rolling Right: 4: Min assist Rolling Left: 4: Min assist Details for Bed Mobility Assistance: verbal cues to position leg to push and reach with opposite arm Transfers Details for Transfer Assistance: pt defers transfers Ambulation/Gait Assistive device: None Wheelchair Mobility Wheelchair Mobility: No    Exercises General Exercises - Lower Extremity Ankle Circles/Pumps: Both;5 reps;Supine;AAROM Heel Slides: AAROM;5 reps;Both;Supine   PT Diagnosis:    PT Problem List: Decreased strength;Decreased activity tolerance;Decreased mobility;Decreased cognition;Decreased safety awareness PT Treatment Interventions: Gait training;Functional mobility training;Therapeutic activities;Therapeutic exercise;Balance training;Patient/family education   PT Goals Acute Rehab PT Goals PT Goal Formulation: With patient/family Time  For Goal Achievement: 02/21/12 Potential to Achieve Goals: Fair Pt will go Supine/Side to Sit: with supervision PT Goal: Supine/Side to Sit - Progress: Goal set today Pt will go Sit to Stand: with supervision PT Goal: Sit to Stand - Progress: Goal set today Pt will Ambulate: 16 - 50 feet PT Goal: Ambulate - Progress: Goal set today  Visit Information  Last PT Received On: 02/07/12 Assistance Needed: +2    Subjective Data  Subjective: daughter reports pt has been at [facility] 2x before Patient Stated Goal: daughter does not want pt to go back to  SNF because pt did not do well with "hospital environment"   Prior Functioning  Home Living Type of Home: Assisted living Home Adaptive Equipment: Wheelchair - manual Prior Function Level of Independence: Needs assistance Needs Assistance: Transfers Transfer Assistance: pt reports she had assistance to get out of bed to W/C, but she could propel her W/C all over the place Communication Communication: No difficulties    Cognition  Overall Cognitive Status: Difficult to assess Arousal/Alertness: Awake/alert Behavior During Session: Agitated Current Attention Level: Focused Cognition - Other Comments: Pt participated well with PT at first, but after pt was brought to EOB, she c/o that I was  moving too fast, I was too rough, I was unkind.  Pt initially said she had to use the bathroom, but would not transfer to [facility], or get on bedpan. She asked me to leave    Extremity/Trunk Assessment Right Lower Extremity Assessment RLE ROM/Strength/Tone: Within functional levels RLE Sensation: WFL - Light Touch;WFL - Proprioception Left Lower Extremity Assessment LLE ROM/Strength/Tone: Within functional levels LLE Sensation: WFL - Light Touch;WFL - Proprioception Trunk Assessment Trunk Assessment: Normal;Other exceptions Trunk Exceptions:  pt keeps eyes closed; daughter reports it is due to her parkinsons.  Pt manually opens eyes and they will  stay open momentarily  pt generally thin with decreased muscle mass   Balance Balance Balance Assessed: Yes Static Sitting Balance Static Sitting - Balance Support: Feet unsupported Static Sitting - Level of Assistance: 5: Stand by assistance Static Sitting - Comment/# of Minutes: 5  End of Session PT - End of Session Activity Tolerance: Treatment limited secondary to agitation Patient left: in bed;with family/visitor present Nurse Communication: Other (comment) (pt behavior)  GP Functional Assessment Tool Used: clinical judgement Functional Limitation: Mobility: Walking and moving around Mobility: Walking and Moving Around Current Status (Z6109): At least 80 percent but less than 100 percent impaired, limited or restricted Mobility: Walking and Moving Around Goal Status [phone]): At least 40 percent but less than 60 percent impaired, limited or restricted   Rosey Bath K. Brown,PT [phone] 02/07/2012, 2:52 PM

## 2012-02-07 NOTE — H&P (Signed)
[facility] History and Physical  April Baker ZOX:[id number] DOB: 1933/01/04    PCP:   Nadean Corwin, MD   Chief Complaint:  Transfer here for abnormal labs.  HPI: April Baker is an 78 y.o. female with hx of parkinson's disease, afib on digitalis, HTN, dementia, CAD, DM, sent here from her NH as routine lab showed Cr of 3.0.   Repeat labs here showed Cr of 1.8, Na 133, and BS of 209.  She is demented and doesn't have any complaints.  Her UA is with TNCT WBC and many bacteria.  She was given IVF, IV Rocephin, and hospitalist was asked to admit her to observation for ARF, UTI, and dehydration.  I noted she is on Lasix and ACE-I.   Her dig level is 0.7.  Rewiew of Systems:  Constitutional: Negative for malaise, fever and chills. No significant weight loss or weight gain Eyes: Negative for eye pain, redness and discharge, diplopia, visual changes, or flashes of light. ENMT: Negative for ear pain, hoarseness, nasal congestion, sinus pressure and sore throat. No headaches; tinnitus, drooling, or problem swallowing. Cardiovascular: Negative for chest pain, palpitations, diaphoresis, dyspnea and peripheral edema. ; No orthopnea, PND Respiratory: Negative for cough, hemoptysis, wheezing and stridor. No pleuritic chestpain. Gastrointestinal: Negative for nausea, vomiting, diarrhea, constipation, abdominal pain, melena, blood in stool, hematemesis, jaundice and rectal bleeding.    Genitourinary: Negative for frequency, dysuria, incontinence,flank pain and hematuria; Musculoskeletal: Negative for back pain and neck pain. Negative for swelling and trauma.;  Skin: . Negative for pruritus, rash, abrasions, bruising and skin lesion.; ulcerations Neuro: Negative for headache, lightheadedness and neck stiffness. Negative for weakness, altered level of consciousness , altered mental status, extremity weakness, burning feet, involuntary movement, seizure and syncope.  Psych: negative for  anxiety, depression, insomnia, tearfulness, panic attacks, hallucinations, paranoia, suicidal or homicidal ideation    Past Medical History  Diagnosis Date  . Hypertension   . Atrial fibrillation   . Parkinson's disease   . Myocardial infarction   . Coronary artery disease   . Hyperlipidemia   . Dementia   . Diabetes mellitus   . Anxiety   . MVP (mitral valve prolapse)     History of  . Unilateral carotid artery stenosis   . Wrist fracture, right   . Peripheral neuropathy   . Lower extremity numbness     Past Surgical History  Procedure Date  . Inner ear surgery 1985    with metal placement-no mri  . Cesarean section 1964  . Wrist reconstruction 1991  . Anal fistulotomy 08/12/2000  . Coronary artery bypass graft   . Cardiac catheterization   . Sternal wire removal 10/29/2003    I&D of 2 upper sternal wires    Medications:  HOME MEDS: Prior to Admission medications   Medication Sig Start Date End Date Taking? Authorizing Provider  ALPRAZolam (XANAX) 0.25 MG tablet Take 0.25 mg by mouth at bedtime. 01/01/12  Yes Standley Brooking, MD  busPIRone (BUSPAR) 10 MG tablet Take 10 mg by mouth 3 (three) times daily.     Yes Historical Provider, MD  carbidopa-levodopa (SINEMET) 25-100 MG per tablet Take 1 tablet by mouth 4 (four) times daily. 8 am, 12 pm, 4 pm, 9 pm   Yes Historical Provider, MD  carvedilol (COREG) 3.125 MG tablet Take 3.125 mg by mouth 2 (two) times daily with a meal. 01/01/12 12/31/12 Yes Standley Brooking, MD  cholecalciferol (VITAMIN D) 1000 UNITS tablet Take 1,000 Units by mouth  daily.    Yes Historical Provider, MD  clopidogrel (PLAVIX) 75 MG tablet Take 75 mg by mouth daily.     Yes Historical Provider, MD  digoxin (LANOXIN) 0.125 MG tablet Take 125 mcg by mouth daily.     Yes Historical Provider, MD  docusate sodium (COLACE) 100 MG capsule Take 100 mg by mouth 2 (two) times daily.     Yes Historical Provider, MD  furosemide (LASIX) 80 MG tablet Take 80 mg by  mouth 2 (two) times daily.    Yes Historical Provider, MD  lisinopril (PRINIVIL,ZESTRIL) 2.5 MG tablet Take 2.5 mg by mouth daily. 01/01/12 12/31/12 Yes Standley Brooking, MD  polyethylene glycol [facility] E[facility] Hal) packet Take 17 g by mouth daily.     Yes Historical Provider, MD  potassium chloride SA (K-DUR,KLOR-CON) 20 MEQ tablet Take 20 mEq by mouth daily.     Yes Historical Provider, MD  QUEtiapine (SEROQUEL) 25 MG tablet Take 25 mg by mouth 2 (two) times daily.   Yes Antonieta Pert, MD  sitaGLIPtin (JANUVIA) 50 MG tablet Take 50 mg by mouth daily.     Yes Historical Provider, MD     Allergies:  Allergies  Allergen Reactions  . Latex Itching    Social History:   reports that she has never smoked. She has never used smokeless tobacco. She reports that she does not drink alcohol or use illicit drugs.  Family History: No family history on file.   Physical Exam: Filed Vitals:   02/06/12 2042 02/06/12 2130 02/06/12 2230 02/07/12 0017  BP: 94/29 132/47 118/32 100/56  Pulse:  64 64 62  Temp:    98.3 F (36.8 C)  TempSrc:    Oral  Resp: 14 16 14 16   SpO2: 100% 99% 97% 98%   Blood pressure 100/56, pulse 62, temperature 98.3 F (36.8 C), temperature source Oral, resp. rate 16, SpO2 98.00%.  GEN:  Pleasant  patient lying in the stretcher in no acute distress; cooperative with exam. She doesn't know why she is here. PSYCH: Knows her name and know [facility] ; does not appear anxious or depressed; affect is appropriate. HEENT: Mucous membranes pink and anicteric; PERRLA; EOM intact; no cervical lymphadenopathy nor thyromegaly or carotid bruit; no JVD; There were no stridor. Neck is very supple. Breasts:: Not examined CHEST WALL: No tenderness CHEST: Normal respiration, clear to auscultation bilaterally.  HEART: Regular rate and rhythm.  There are a 2-3 Blowing Holosystolic murmur at LSB to the Apex.  BACK: No kyphosis or scoliosis; no CVA tenderness ABDOMEN: soft and non-tender;  no masses, no organomegaly, normal abdominal bowel sounds; no pannus; no intertriginous candida. There is no rebound and no distention. Rectal Exam: Not done EXTREMITIES: No bone or joint deformity; age-appropriate arthropathy of the hands and knees; no edema; no ulcerations.  There is no calf tenderness. Genitalia: not examined PULSES: 2+ and symmetric SKIN: Normal hydration no rash or ulceration CNS: Cranial nerves 2-12 grossly intact no focal lateralizing neurologic deficit.  Speech is fluent; uvula elevated with phonation, facial symmetry and tongue midline. DTR are normal bilaterally, cerebella exam is intact, barbinski is negative and strengths are equaled bilaterally.  No sensory loss.   Labs on Admission:  Basic Metabolic Panel:  Lab 02/06/12 4098  NA 133*  K 3.8  CL 90*  CO2 28  GLUCOSE 209*  BUN 78*  CREATININE 1.80*  CALCIUM 9.3  MG --  PHOS --   Liver Function Tests:  Lab 02/06/12 2127  AST 25  ALT <5  ALKPHOS 43  BILITOT 0.3  PROT 7.4  ALBUMIN 4.0   No results found for this basename: LIPASE:5,AMYLASE:5 in the last 168 hours No results found for this basename: AMMONIA:5 in the last 168 hours CBC:  Lab 02/06/12 2127  WBC 22.1*  NEUTROABS --  HGB 10.7*  HCT 31.7*  MCV 82.3  PLT 286   Cardiac Enzymes:  Lab 02/06/12 2127  CKTOTAL 41  CKMB 2.9  CKMBINDEX --  TROPONINI --    CBG: No results found for this basename: GLUCAP:5 in the last 168 hours   Radiological Exams on Admission: No results found.  EKG: Independently reviewed.    Assessment/Plan Present on Admission:  .A-fib .Parkinson disease .Dementia .Leukocytosis ARF Dehydration UTI.  PLAN:  For her UTI, we will continue her IV Rocephin.  She is dehydrated and will get gentle IVF after initial bolus.  She has slight elevated Cr, and I suspect it is from volume depletion.  I will stop her ACE-I, Lasix, and K supplement.  For her DM, will add sensitve scale.  She is DNR as noted in  her recent hospitalization.  Will continue DNR code status. Her Leukocytosis is chronic.  She is stable and will be admitted to general medical floor under [facility] service.  Other plans as per orders.  Code Status: DNR.   Houston Siren, MD. [facility] Pager [phone] 7pm to 7am.  02/07/2012, 12:34 AM

## 2012-02-07 NOTE — Progress Notes (Signed)
INITIAL ADULT NUTRITION ASSESSMENT Date: 02/07/2012   Time: 5:06 PM Reason for Assessment: Poor intake   INTERVENTION: Glucerna shake BID. Will order snacks per pt preference. Will monitor.   Pt meets criteria for severe PCM of chronic illness AEB likely <75% estimated energy intake for at least the past month with severe muscle and subcutaneous fat loss   ASSESSMENT: Female 78 y.o.  Dx: Acute kidney injury  Food/Nutrition Related Hx: Pt with dementia but was able to state that she was eating only small portions at [facility]. Pt reports poor appetite PTA. Pt denies any problems chewing/swallowing. Pt reports usual weight of 100 pounds. Pt appear thins and has decreased strength per PT notes and was using a wheelchair PTA.   Hx:  Past Medical History  Diagnosis Date  . Hypertension   . Atrial fibrillation   . Parkinson's disease   . Myocardial infarction   . Coronary artery disease   . Hyperlipidemia   . Dementia   . Diabetes mellitus   . Anxiety   . MVP (mitral valve prolapse)     History of  . Unilateral carotid artery stenosis   . Wrist fracture, right   . Peripheral neuropathy   . Lower extremity numbness    Related Meds:  Scheduled Meds:   . busPIRone  10 mg Oral TID  . carbidopa-levodopa  1 tablet Oral QID  . carvedilol  3.125 mg Oral BID WC  . cefTRIAXone (ROCEPHIN)  IV  1 g Intravenous Q24H  . cefTRIAXone (ROCEPHIN)  IV  2 g Intravenous Once  . cholecalciferol  1,000 Units Oral Daily  . clopidogrel  75 mg Oral Daily  . digoxin  125 mcg Oral Daily  . docusate sodium  100 mg Oral BID  . heparin  5,000 Units Subcutaneous Q8H  . insulin aspart  0-9 Units Subcutaneous TID WC  . lidocaine      . linagliptin  5 mg Oral Daily  . polyethylene glycol  17 g Oral Daily  . potassium chloride  10 mEq Intravenous Q1 Hr x 3  . sodium chloride  250 mL Intravenous Once  . sodium chloride  250 mL Intravenous Once   Continuous Infusions:   . sodium chloride 50 mL/hr at  02/07/12 0216   PRN Meds:.  Ht: 5\' 4"  (162.6 cm)  Wt: 100 lb 12 oz (45.7 kg)  Ideal Wt: 120 lb % Ideal Wt: 120  Usual Wt: 100 lb per pt report % Usual Wt: 100  Body mass index is 17.29 kg/(m^2). Underweight   Labs:  CMP     Component Value Date/Time   NA 138 02/07/2012 0354   K 2.9* 02/07/2012 0354   CL 98 02/07/2012 0354   CO2 28 02/07/2012 0354   GLUCOSE 129* 02/07/2012 0354   BUN 66* 02/07/2012 0354   CREATININE 1.58* 02/07/2012 0354   CALCIUM 8.8 02/07/2012 0354   PROT 7.4 02/06/2012 2127   ALBUMIN 4.0 02/06/2012 2127   AST 25 02/06/2012 2127   ALT <5 02/06/2012 2127   ALKPHOS 43 02/06/2012 2127   BILITOT 0.3 02/06/2012 2127   GFRNONAA 30* 02/07/2012 0354   GFRAA 35* 02/07/2012 0354   Lab Results  Component Value Date   HGBA1C 6.9* 05/15/2011   CBG (last 3)   Basename 02/07/12 1314 02/07/12 0742  GLUCAP 217* 116*    Intake/Output Summary (Last 24 hours) at 02/07/12 1715 Last data filed at 02/07/12 1200  Gross per 24 hour  Intake 186.67 ml  Output    700 ml  Net -513.33 ml   Last BM - 02/04/12  Diet Order: Carb Control   IVF:    sodium chloride Last Rate: 50 mL/hr at 02/07/12 0216    Estimated Nutritional Needs:   Kcal:1600-1800 Protein:70-85g Fluid:1.6-1.8L  NUTRITION DIAGNOSIS: -Inadequate oral intake (NI-2.1).  Status: Ongoing  RELATED TO: dementia, poor appetite  AS EVIDENCE BY: pt statement, underweight  MONITORING/EVALUATION(Goals): Pt to consume >75% of meals/supplements.   EDUCATION NEEDS: -No education needs identified at this time   Dietitian #: [phone]  DOCUMENTATION CODES Per approved criteria  -Severe malnutrition in the context of chronic illness -Underweight    Marshall Cork 02/07/2012, 5:06 PM

## 2012-02-08 DIAGNOSIS — D638 Anemia in other chronic diseases classified elsewhere: Secondary | ICD-10-CM | POA: Diagnosis present

## 2012-02-08 DIAGNOSIS — I4891 Unspecified atrial fibrillation: Secondary | ICD-10-CM

## 2012-02-08 DIAGNOSIS — E860 Dehydration: Secondary | ICD-10-CM

## 2012-02-08 DIAGNOSIS — R532 Functional quadriplegia: Secondary | ICD-10-CM | POA: Diagnosis present

## 2012-02-08 LAB — BASIC METABOLIC PANEL
CO2: 28 mEq/L (ref 19–32)
Calcium: 8.7 mg/dL (ref 8.4–10.5)
GFR calc non Af Amer: 42 mL/min — ABNORMAL LOW (ref >90)
Glucose, Bld: 89 mg/dL (ref 70–99)
Potassium: 3.4 mEq/L — ABNORMAL LOW (ref 3.5–5.1)
Sodium: 140 mEq/L (ref 135–145)

## 2012-02-08 LAB — CBC
Hemoglobin: 8.7 g/dL — ABNORMAL LOW (ref 12.0–15.0)
MCH: 27.4 pg (ref 26.0–34.0)
MCHC: 32.6 g/dL (ref 30.0–36.0)
Platelets: 224 10*3/uL (ref 150–400)
RBC: 3.17 MIL/uL — ABNORMAL LOW (ref 3.87–5.11)

## 2012-02-08 LAB — GLUCOSE, CAPILLARY

## 2012-02-08 MED ORDER — BOOST / RESOURCE BREEZE PO LIQD
1.0000 | Freq: Two times a day (BID) | ORAL | Status: DC
  Administered 2012-02-08 – 2012-02-10 (×5): 1 via ORAL

## 2012-02-08 MED ORDER — POTASSIUM CHLORIDE 10 MEQ/100ML IV SOLN
10.0000 meq | INTRAVENOUS | Status: AC
  Administered 2012-02-08 (×3): 10 meq via INTRAVENOUS
  Filled 2012-02-08 (×3): qty 100

## 2012-02-08 NOTE — Progress Notes (Addendum)
[facility] PROGRESS NOTE  April Baker ZOX:[id number] DOB: 06/20/1932 DOA: 02/06/2012 PCP: Nadean Corwin, MD  Brief narrative: 78 year old female with multiple medical issues including but not limited to atrial fibrillation, Parkinson's dementia, wheelchair down who was transferred from [facility] 02/06/2012 for acute kidney injury. In addition, patient was found to have UTI on this admission.  Assessment/Plan:  Principal Problem:  *UTI (lower urinary tract infection) - Continue Rocephin - Urine culture shows no growth to date  Active Problems: Atrial fibrillation - Rate controlled with digoxin and Coreg   DM (diabetes mellitus) - Controlled with linagliptin - continue SSI in addition to the above regimen   Parkinson disease and dementia - Continue Sinemet   Leukocytosis - Secondary to urinary tract infection - Continue Rocephin - Blood cell count trending down from 22 on admission to 17.8 today   Dehydration - Secondary to urinary tract infection - May continue normal saline at 50 cc an hour - Encourage by mouth intake   Hypokalemia - Potassium 3.4 today - We will repeat with IV potassium - Followup BMP in the morning   Acute kidney injury - Likely prerenal, secondary to UTI and dehydration - Creatinine trending down to 1.31 today (creatinine 1.8 on admission)   Anemia of chronic disease - Likely combination of poor oral intake, dementia - Hemoglobin stable at 8.7 - No signs of active bleed   Functional quadriplegia - Patient is wheelchair bound - PT evaluation pending  Protein calorie malnutrition - Nutrition consulted  Code Status: DO NOT RESUSCITATE Family Communication: Family is not present at bedside Disposition Plan: To assisted living facility when stable; also pending PT evaluation  Manson Passey, MD  [facility] Pager [phone]  If 7PM-7AM, please contact night-coverage [email or web address] Password [email or web address] 02/08/2012, 12:45  PM   LOS: 2 days   Consultants:  Physical therapy  Procedures:  None  Antibiotics:  Rocephin 02/06/2012 -->  HPI/Subjective: No acute events overnight.  Objective:  02/07/12 1400 02/07/12 2334 02/08/12 0643  BP: 124/54 118/60 126/59  Pulse: 66 56 61  Temp: 99.1 F (37.3 C) 98.6 F (37 C) 98.7 F (37.1 C)  TempSrc: Oral Oral Oral  Resp: 16 20 20   SpO2: 95% 96% 94%    Intake/Output Summary (Last 24 hours) at 02/08/12 1245 Last data filed at 02/08/12 0201  Gross per 24 hour  Intake    700 ml  Output    500 ml  Net    200 ml    Exam:   General:  Pt is not in acute distress, confused  Cardiovascular: Irregular rhythm, rate controlled, S1/S2, no murmurs, no rubs, no gallops  Respiratory: Clear to auscultation bilaterally, no wheezing, no crackles, no rhonchi  Abdomen: Soft, non tender, non distended, bowel sounds present, no guarding  Extremities: No edema, pulses DP and PT palpable bilaterally  Neuro: Grossly nonfocal  Data Reviewed: Basic Metabolic Panel:  Lab 02/08/12 8295 02/07/12 0354 02/06/12 2127  NA 140 138 133*  K 3.4* 2.9* 3.8  CL 103 98 90*  CO2 28 28 28   GLUCOSE 89 129* 209*  BUN 39* 66* 78*  CREATININE 1.21* 1.58* 1.80*  CALCIUM 8.7 8.8 9.3   Liver Function Tests:  Lab 02/06/12 2127  AST 25  ALT <5  ALKPHOS 43  BILITOT 0.3  PROT 7.4  ALBUMIN 4.0   CBC:  Lab 02/08/12 0310 02/07/12 0354 02/06/12 2127  WBC 17.8* 19.4* 22.1*  HGB 8.7* 9.6* 10.7*  HCT 26.7* 28.1* 31.7*  MCV 84.2 82.4 82.3  PLT 224 245 286   Cardiac Enzymes:  Lab 02/06/12 2127  CKTOTAL 41  CKMB 2.9  CKMBINDEX --  TROPONINI --   CBG:  Lab 02/08/12 1138 02/08/12 0815 02/07/12 2150 02/07/12 1814 02/07/12 1314  GLUCAP 172* 101* 83 152* 217*    URINE CULTURE     Status: Normal   Collection Time   02/06/12  8:45 PM      Component Value Range Status Comment   Specimen Description URINE  Final    Culture NO GROWTH   Final    Report Status 02/07/2012  FINAL   Final   MRSA PCR SCREENING     Status: Normal   MRSA by PCR NEGATIVE  NEGATIVE Final      Studies: No results found.  Scheduled Meds:   . busPIRone  10 mg Oral TID  . carbidopa-levodopa  1 tablet Oral QID  . carvedilol  3.125 mg Oral BID WC  . cefTRIAXone   1 g Intravenous Q24H  . cholecalciferol  1,000 Units Oral Daily  . clopidogrel  75 mg Oral Daily  . digoxin  125 mcg Oral Daily  . docusate sodium  100 mg Oral BID  . feeding supplement  237 mL Oral BID BM  . heparin  5,000 Units Subcutaneous Q8H  . insulin aspart  0-9 Units Subcutaneous TID WC  . linagliptin  5 mg Oral Daily  . polyethylene glycol  17 g Oral Daily  . potassium chloride  10 mEq Intravenous Q1 Hr x 3

## 2012-02-08 NOTE — Progress Notes (Signed)
CRITICAL VALUE ALERT  Critical value received CBG 58  Date of notification:  02/08/12  Time of notification:  1635  Critical value read back:: yes  Nurse who received alert:  Harrietta Guardian RN  MD notified (1st page):    Time of first page:    MD notified (2nd page):  Time of second page:  Responding MD:    Time MD responded:

## 2012-02-08 NOTE — Progress Notes (Signed)
Nutrition Brief Note  - Full nutrition assessment completed yesterday. Noted pt told RN today that she did not like the Glucerna shakes and was concerned about gaining weight and taking too many calories. Pt with dementia. Discussed with pt's daughter who reported pt did not like the Glucerna shakes because of the taste, not likely because of the concern of weight gain. Daughter stated pt typically has the "appetite of a horse". Pt getting assisted by RN with setting up dinner tray. Will try [employer] instead as a nutrition supplement which was agreeable with pt's daughter. Will continue to monitor intake.   Dietitian# [phone]

## 2012-02-08 NOTE — Progress Notes (Signed)
Contacted [facility] to advise them of PT evaluation and recommendations- they feel they can accommodate the patient there at the level of assistance needed- prior to admission she was requiring some assistance with wheelchair transfers at the ALF- Family contacted to discuss plans and agrees with this plan as well- ALF rep advised CSW they cannot accept patients back over the weekend. Reece Levy, MSW, Theres[facility] Majors [phone]

## 2012-02-08 NOTE — Clinical Documentation Improvement (Signed)
MALNUTRITION DOCUMENTATION CLARIFICATION  THIS DOCUMENT IS NOT A PERMANENT PART OF THE MEDICAL RECORD  TO RESPOND TO THE THIS QUERY, FOLLOW THE INSTRUCTIONS BELOW:  1. If needed, update documentation for the patient's encounter via the notes activity.  2. Access this query again and click edit on the In [employer].  3. After updating, or not, click F2 to complete all highlighted (required) fields concerning your review. Select "additional documentation in the medical record" OR "no additional documentation provided".  4. Click Sign note button.  5. The deficiency will fall out of your In Basket *Please let us know if you are not able to complete this workflow by phone or e-mail (listed below).  Please update your documentation within the medical record to reflect your response to this query.                                                                                        02/08/12   Dear [facility],  In a better effort to capture your patient's severity of illness, reflect appropriate length of stay and utilization of resources, a review of the patient medical record has revealed the following indicators.    Based on your clinical judgment, please clarify and document in a progress note and/or discharge summary the clinical condition associated with the following supporting information:  In responding to this query please exercise your independent judgment.  The fact that a query is asked, does not imply that any particular answer is desired or expected.  02/07/12 Nutrition Assesment noted with documentation criteria for "-Severe malnutrition in the context of chronic illness -Underweight".Marland KitchenMarland KitchenFor accurate Dx specificity & severity please help verify nutr dx for clinical cond being eval'd, mon'd & tx. Thank you  Possible Clinical Conditions? . Mild Malnutrition  . Moderate Malnutrition . Severe Malnutrition   . Protein Calorie Malnutrition . Severe Protein  Calorie Malnutrition  . Emaciation  . Cachexia   Other Condition (please specify) Cannot Clinically Determine  Supporting Information: Risk Factors: See Nutrition Assessment 02/07/12  Signs & Symptoms: See Nutrition Assessment 02/07/12  -Diagnostics: See Nutrition Assessment 02/07/12  Treatments: See Nutrition Assessment 02/07/12  -Medications:  -Nutrition Consult: See Nutrition Assessment 02/07/12  You may use possible, probable, or suspect with inpatient documentation. possible, probable, suspected diagnoses MUST be documented at the time of discharge  Reviewed:additional information provided in progress note 02/08/2012  Thank You,  Toribio Harbour, RN, BSN, CCDS Certified Clinical Documentation Specialist Pager: [phone]  Health Information Management [place]

## 2012-02-08 NOTE — Plan of Care (Signed)
Problem: Phase I Progression Outcomes Goal: OOB as tolerated unless otherwise ordered Outcome: Not Met (add Reason) PT evaluated pt. Pt is wheelchair-bound at baseline.

## 2012-02-08 NOTE — Progress Notes (Signed)
Pt states she does not like the Glucerna shakes. She did drink two today, but does not want anymore. Pt is concerned about gaining weight and taking in too many calories.

## 2012-02-08 NOTE — Progress Notes (Signed)
Coreg medication held at 1700. Blood Pressure has been soft at 1400, Blood Pressure is improving, but Heart Rate is 60. MD notified of Blood Pressure and nursing call to hold medication; MD Dr. Elisabeth Pigeon is in agreement with not giving Coreg this afternoon. Will continue to monitor patient.

## 2012-02-08 NOTE — Progress Notes (Addendum)
Pt is getting ready to eat dinner and shows no signs of hypoglycemic.  Pt given orange juice in additional to her meal tray.  Will recheck CBG.

## 2012-02-09 ENCOUNTER — Inpatient Hospital Stay (HOSPITAL_COMMUNITY): Payer: Medicare Other

## 2012-02-09 DIAGNOSIS — D638 Anemia in other chronic diseases classified elsewhere: Secondary | ICD-10-CM

## 2012-02-09 LAB — BASIC METABOLIC PANEL
BUN: 27 mg/dL — ABNORMAL HIGH (ref 6–23)
CO2: 27 mEq/L (ref 19–32)
Calcium: 9 mg/dL (ref 8.4–10.5)
Chloride: 106 mEq/L (ref 96–112)
Creatinine, Ser: 1.18 mg/dL — ABNORMAL HIGH (ref 0.50–1.10)
Glucose, Bld: 143 mg/dL — ABNORMAL HIGH (ref 70–99)

## 2012-02-09 LAB — GLUCOSE, CAPILLARY
Glucose-Capillary: 171 mg/dL — ABNORMAL HIGH (ref 70–99)
Glucose-Capillary: 94 mg/dL (ref 70–99)
Glucose-Capillary: 276 mg/dL — ABNORMAL HIGH (ref 70–99)

## 2012-02-09 LAB — URINALYSIS, ROUTINE W REFLEX MICROSCOPIC
Bilirubin Urine: NEGATIVE
Glucose, UA: >1000 mg/dL — AB
Ketones, ur: NEGATIVE mg/dL
pH: 6 (ref 5.0–8.0)

## 2012-02-09 LAB — CBC
HCT: 27.7 % — ABNORMAL LOW (ref 36.0–46.0)
MCH: 27.6 pg (ref 26.0–34.0)
MCV: 86 fL (ref 78.0–100.0)
RBC: 3.22 MIL/uL — ABNORMAL LOW (ref 3.87–5.11)
WBC: 18.4 10*3/uL — ABNORMAL HIGH (ref 4.0–10.5)

## 2012-02-09 MED ORDER — SODIUM CHLORIDE 0.9 % IV BOLUS (SEPSIS)
500.0000 mL | Freq: Once | INTRAVENOUS | Status: AC
  Administered 2012-02-09: 500 mL via INTRAVENOUS

## 2012-02-09 MED ORDER — SODIUM CHLORIDE 0.9 % IV SOLN
Freq: Once | INTRAVENOUS | Status: AC
  Administered 2012-02-09: 17:00:00 via INTRAVENOUS

## 2012-02-09 NOTE — Progress Notes (Addendum)
[facility] PROGRESS NOTE  April Baker ZOX:[id number] DOB: 1933/04/10 DOA: 02/06/2012 PCP: Nadean Corwin, MD  Brief narrative: 78 year old female with multiple medical issues including but not limited to atrial fibrillation, Parkinson's dementia, wheelchair down who was transferred from [facility] 02/06/2012 for acute kidney injury. In addition, patient was found to have UTI on this admission.   Assessment/Plan:   Principal Problem:  *UTI (lower urinary tract infection)  - Will continue Rocephin  - Urine culture shows no growth to date   Active Problems:  Atrial fibrillation  - Rate controlled with digoxin and Coreg   DM (diabetes mellitus)  - Controlled with linagliptin  - continue SSI in addition to the above regimen   Parkinson disease and dementia  - Will continue Sinemet   Leukocytosis  - Secondary to urinary tract infection  - Continue Rocephin  - WBC count trending down   Dehydration and hypotension - unclear why patient still hypotensive - will continue fluid challenges - check procalcitonin and lactic acid level   Hypokalemia  - Potassium WNL today   Acute kidney injury  - Likely prerenal, secondary to UTI and dehydration  - Creatinine trending down    Anemia of chronic disease  - Likely combination of poor oral intake, dementia  - Hemoglobin stable  - No signs of active bleed   Functional quadriplegia  - Patient is wheelchair bound  - PT evaluation pending   Protein calorie malnutrition  - Nutrition consulted   Code Status: DO NOT RESUSCITATE  Family Communication: Family is not present at bedside  Disposition Plan: To assisted living facility when stable; also pending PT evaluation   Consultants:  Physical therapy Procedures:  None Antibiotics:  Rocephin 02/06/2012 -->  Manson Passey, MD  [facility] Pager [phone]  If 7PM-7AM, please contact night-coverage [email or web address] Password [email or web address] 02/09/2012, 9:03 AM   LOS: 3 days   HPI/Subjective: No acute events overnight.  Objective: Filed Vitals:   02/08/12 1732 02/08/12 2053 02/08/12 2158 02/09/12 0547  BP: 120/38 102/62 86/42 91/50   Pulse:   61 57  Temp:   98.8 F (37.1 C) 98.5 F (36.9 C)  TempSrc:   Oral Oral  Resp: 18  16 16   Height:      Weight:    46.5 kg (102 lb 8.2 oz)  SpO2:   98% 98%    Intake/Output Summary (Last 24 hours) at 02/09/12 0903 Last data filed at 02/09/12 0600  Gross per 24 hour  Intake   2240 ml  Output   1001 ml  Net   1239 ml    Exam:   General:  Pt is in no acute distress, not oriented  Cardiovascular: Regular rate and rhythm, S1/S2, no murmurs, no rubs, no gallops  Respiratory: Clear to auscultation bilaterally, no wheezing, no crackles, no rhonchi  Abdomen: Soft, non tender, non distended, bowel sounds present, no guarding  Extremities: LE edema, pulses DP and PT palpable bilaterally  Neuro: Grossly nonfocal  Data Reviewed: Basic Metabolic Panel:  Lab 02/09/12 8295 02/08/12 0310 02/07/12 0354 02/06/12 2127  NA 142 140 138 133*  K 3.5 3.4* 2.9* 3.8  CL 106 103 98 90*  CO2 27 28 28 28   GLUCOSE 143* 89 129* 209*  BUN 27* 39* 66* 78*  CREATININE 1.18* 1.21* 1.58* 1.80*  CALCIUM 9.0 8.7 8.8 9.3  MG -- -- -- --  PHOS -- -- -- --   Liver Function Tests:  Lab 02/06/12 2127  AST 25  ALT <5  ALKPHOS 43  BILITOT 0.3  PROT 7.4  ALBUMIN 4.0   No results found for this basename: LIPASE:5,AMYLASE:5 in the last 168 hours No results found for this basename: AMMONIA:5 in the last 168 hours CBC:  Lab 02/09/12 0539 02/08/12 0310 02/07/12 0354 02/06/12 2127  WBC 18.4* 17.8* 19.4* 22.1*  NEUTROABS -- -- -- --  HGB 8.9* 8.7* 9.6* 10.7*  HCT 27.7* 26.7* 28.1* 31.7*  MCV 86.0 84.2 82.4 82.3  PLT 223 224 245 286   Cardiac Enzymes:  Lab 02/06/12 2127  CKTOTAL 41  CKMB 2.9  CKMBINDEX --  TROPONINI --   BNP: No components found with this basename: POCBNP:5 CBG:  Lab 02/09/12 0748  02/08/12 2126 02/08/12 1727 02/08/12 1625 02/08/12 1138  GLUCAP 171* 170* 124* 58* 172*    Recent Results (from the past 240 hour(s))  URINE CULTURE     Status: Normal   Collection Time   02/06/12  8:45 PM      Component Value Range Status Comment   Specimen Description URINE, CLEAN CATCH   Final    Special Requests none at collection   ceftriaxone started Normal   Final    Culture  Setup Time 02/07/2012 13:28   Final    Colony Count NO GROWTH   Final    Culture NO GROWTH   Final    Report Status 02/07/2012 FINAL   Final   MRSA PCR SCREENING     Status: Normal   Collection Time   02/07/12  9:57 AM      Component Value Range Status Comment   MRSA by PCR NEGATIVE  NEGATIVE Final      Studies: No results found.  Scheduled Meds:   . busPIRone  10 mg Oral TID  . carbidopa-levodopa  1 tablet Oral QID  . carvedilol  3.125 mg Oral BID WC  . cefTRIAXone (ROCEPHIN)  IV  1 g Intravenous Q24H  . cholecalciferol  1,000 Units Oral Daily  . clopidogrel  75 mg Oral Daily  . digoxin  125 mcg Oral Daily  . docusate sodium  100 mg Oral BID  . feeding supplement  1 Container Oral BID PC  . heparin  5,000 Units Subcutaneous Q8H  . insulin aspart  0-9 Units Subcutaneous TID WC  . linagliptin  5 mg Oral Daily  . polyethylene glycol  17 g Oral Daily  . potassium chloride  10 mEq Intravenous Q1 Hr x 3  . DISCONTD: feeding supplement  237 mL Oral BID BM   Continuous Infusions:   . sodium chloride 50 mL/hr at 02/08/12 1551

## 2012-02-09 NOTE — Progress Notes (Signed)
Pt. BP remains low at 78/42, Hr=60. Denies complaints of chest pain or dizziness. No shortness of breath noted. Dr. Elisabeth Pigeon notified and new orders received. Continue to assess and monitor.

## 2012-02-09 NOTE — Progress Notes (Signed)
Merdis Delay, NP, notified of BP of 91/50. No new orders were given.

## 2012-02-09 NOTE — Progress Notes (Signed)
Pt. automatic BP=75/45, manual recheck = 78/54 HR=60, Resp.= 16. Pt denies complaints of chest pain and dizziness. No shortness of breath noted. Dr. Elisabeth Pigeon notified, new orders received. Med. With 500 cc Normal Saline Bolus. Continue to assess and monitor.

## 2012-02-10 LAB — CARDIAC PANEL(CRET KIN+CKTOT+MB+TROPI)
Relative Index: INVALID (ref 0.0–2.5)
Troponin I: <0.3 ng/mL (ref <0.30)

## 2012-02-10 LAB — GLUCOSE, CAPILLARY
Glucose-Capillary: 168 mg/dL — ABNORMAL HIGH (ref 70–99)
Glucose-Capillary: 138 mg/dL — ABNORMAL HIGH (ref 70–99)
Glucose-Capillary: 154 mg/dL — ABNORMAL HIGH (ref 70–99)

## 2012-02-10 MED ORDER — HALOPERIDOL LACTATE 5 MG/ML IJ SOLN
1.0000 mg | Freq: Two times a day (BID) | INTRAMUSCULAR | Status: DC | PRN
  Administered 2012-02-10: 1 mg via INTRAVENOUS
  Filled 2012-02-10: qty 1

## 2012-02-10 MED ORDER — FLUTICASONE PROPIONATE 50 MCG/ACT NA SUSP
2.0000 | Freq: Every day | NASAL | Status: DC
  Filled 2012-02-10 (×2): qty 16

## 2012-02-10 MED ORDER — SODIUM CHLORIDE 0.9 % IV BOLUS (SEPSIS)
1000.0000 mL | Freq: Once | INTRAVENOUS | Status: AC
  Administered 2012-02-10: 1000 mL via INTRAVENOUS

## 2012-02-10 MED ORDER — NITROGLYCERIN 0.4 MG SL SUBL
0.4000 mg | SUBLINGUAL_TABLET | Freq: Once | SUBLINGUAL | Status: AC
  Administered 2012-02-10: 0.4 mg via SUBLINGUAL
  Filled 2012-02-10: qty 25

## 2012-02-10 NOTE — Progress Notes (Signed)
[facility] PROGRESS NOTE  April Baker ZOX:[id number] DOB: April 20, 1933 DOA: 02/06/2012 PCP: Nadean Corwin, MD  Brief narrative: 78 year old female with multiple medical issues including but not limited to atrial fibrillation, Parkinson's dementia, wheelchair down who was transferred from [facility] 02/06/2012 for acute kidney injury. In addition, patient was found to have UTI on this admission. Hospital course complicated by persistent hypotension which now may be patient's baseline as she does not appears to be septic.  Assessment/Plan:   Principal Problem:  *UTI (lower urinary tract infection)  - Will continue Rocephin for now - Urine culture shows no growth to date   Active Problems:  Atrial fibrillation  - Rate controlled with digoxin and Coreg   DM (diabetes mellitus)  - Controlled with linagliptin  - continue SSI in addition to the above regimen   Parkinson disease and dementia  - Will continue Sinemet   Leukocytosis  - Secondary to urinary tract infection  - Continue Rocephin  - WBC count trending down  - follow up CBC in am  Dehydration and hypotension  - unclear why patient still hypotensive, perhaps new baseline - will continue fluid challenges PRN - check procalcitonin and lactic acid level - within normal limits  Hypokalemia  - Potassium WNL  - follow up BMP in am  Acute kidney injury  - Likely prerenal, secondary to UTI and dehydration  - Creatinine trending down  - follow up BMP in am  Anemia of chronic disease  - Likely combination of poor oral intake, dementia  - Hemoglobin stable  - No signs of active bleed   Functional quadriplegia  - Patient is wheelchair bound  - PT evaluation pending  - encourage out of bed to chair  Protein calorie malnutrition  - Nutrition consulted   Code Status: DO NOT RESUSCITATE  Family Communication: Family is not present at bedside  Disposition Plan: To assisted living facility when stable; also pending  PT evaluation; encourage out of bed to chair  Consultants:  Physical therapy Procedures:  None Antibiotics:  Rocephin 02/06/2012 -->  Manson Passey, MD  [facility] Pager [phone]  If 7PM-7AM, please contact night-coverage [email or web address] Password [email or web address] 02/10/2012, 9:30 AM   LOS: 4 days   HPI/Subjective: No acute events overnight. Remains hypotensive.  Objective: Filed Vitals:   02/09/12 1833 02/09/12 2231 02/09/12 2350 02/10/12 0529  BP: 105/53 91/43 90/50  98/49  Pulse: 73 69  58  Temp:  98.3 F (36.8 C)  97.9 F (36.6 C)  TempSrc:  Oral  Oral  Resp: 20 18  16   Height:      Weight:      SpO2:  98%  100%    Intake/Output Summary (Last 24 hours) at 02/10/12 0930 Last data filed at 02/10/12 0803  Gross per 24 hour  Intake   6917 ml  Output   1775 ml  Net   5142 ml    Exam:   General:  Pt is in no acute distress  Cardiovascular: Regular rate and rhythm, S1/S2  Respiratory: Clear to auscultation bilaterally, no wheezing  Abdomen: Soft, non tender, non distended, bowel sounds present, no guarding  Extremities: No edema, pulses DP and PT palpable bilaterally  Neuro: Grossly nonfocal  Data Reviewed: Basic Metabolic Panel:  Lab 02/09/12 8295 02/08/12 0310 02/07/12 0354 02/06/12 2127  NA 142 140 138 133*  K 3.5 3.4* 2.9* 3.8  CL 106 103 98 90*  CO2 27 28 28 28   GLUCOSE 143* 89 129* 209*  BUN 27* 39* 66* 78*  CREATININE 1.18* 1.21* 1.58* 1.80*  CALCIUM 9.0 8.7 8.8 9.3   Liver Function Tests:  Lab 02/06/12 2127  AST 25  ALT <5  ALKPHOS 43  BILITOT 0.3  PROT 7.4  ALBUMIN 4.0   CBC:  Lab 02/09/12 0539 02/08/12 0310 02/07/12 0354 02/06/12 2127  WBC 18.4* 17.8* 19.4* 22.1*  NEUTROABS -- -- -- --  HGB 8.9* 8.7* 9.6* 10.7*  HCT 27.7* 26.7* 28.1* 31.7*  MCV 86.0 84.2 82.4 82.3  PLT 223 224 245 286   Cardiac Enzymes:  Lab 02/06/12 2127  CKTOTAL 41  CKMB 2.9  CKMBINDEX --  TROPONINI --   CBG:  Lab 02/10/12 0729 02/09/12  2229 02/09/12 1704 02/09/12 1148 02/09/12 0748  GLUCAP 87 153* 276* 94 171*    Recent Results (from the past 240 hour(s))  URINE CULTURE     Status: Normal   Collection Time   02/06/12  8:45 PM      Component Value Range Status Comment   Specimen Description URINE, CLEAN CATCH   Final    Special Requests none at collection   ceftriaxone started Normal   Final    Culture  Setup Time 02/07/2012 13:28   Final    Colony Count NO GROWTH   Final    Culture NO GROWTH   Final    Report Status 02/07/2012 FINAL   Final   MRSA PCR SCREENING     Status: Normal   Collection Time   02/07/12  9:57 AM      Component Value Range Status Comment   MRSA by PCR NEGATIVE  NEGATIVE Final      Studies: Dg Chest Port 1 View 02/09/2012  * IMPRESSION: Increased retrocardiac atelectasis, otherwise no acute cardiopulmonary abnormality.      Scheduled Meds:   . busPIRone  10 mg Oral TID  . carbidopa-levodopa  1 tablet Oral QID  . carvedilol  3.125 mg Oral BID WC  . cefTRIAXone   1 g Intravenous Q24H  . cholecalciferol  1,000 Units Oral Daily  . clopidogrel  75 mg Oral Daily  . digoxin  125 mcg Oral Daily  . docusate sodium  100 mg Oral BID  . feeding supplement  1 Container Oral BID PC  . heparin  5,000 Units Subcutaneous Q8H  . insulin aspart  0-9 Units Subcutaneous TID WC  . linagliptin  5 mg Oral Daily  . polyethylene glycol  17 g Oral Daily   Continuous Infusions:   . sodium chloride 50 mL/hr at 02/09/12 1712

## 2012-02-10 NOTE — Progress Notes (Signed)
Pt. Refused labs to be drawn today. States she will wait until she sees and speaks with Dr. Also, pt. Appears to be slightly confused, unpleasant, and agitated today. Continue active listening, assess, and monitor.

## 2012-02-10 NOTE — Progress Notes (Signed)
Pt. Was very agitated, Dr. Ellin Goodie was notified and ordered Haldol PRN every 12 hours as need for agitation.

## 2012-02-10 NOTE — Progress Notes (Signed)
Dr. Elisabeth Pigeon notified of low BP 95/45. Pt. Denied complaints of chest pain or dizziness and no shortness of breath noted. Pt. Complains of head and nasal congestion. Dr. Elisabeth Pigeon notified and new orders received.

## 2012-02-11 LAB — CBC
MCV: 84 fL (ref 78.0–100.0)
Platelets: 208 10*3/uL (ref 150–400)
RDW: 14.4 % (ref 11.5–15.5)
WBC: 14.8 10*3/uL — ABNORMAL HIGH (ref 4.0–10.5)

## 2012-02-11 LAB — BASIC METABOLIC PANEL
Chloride: 110 mEq/L (ref 96–112)
Creatinine, Ser: 0.89 mg/dL (ref 0.50–1.10)
GFR calc Af Amer: 71 mL/min — ABNORMAL LOW (ref >90)
Potassium: 3.4 mEq/L — ABNORMAL LOW (ref 3.5–5.1)

## 2012-02-11 LAB — GLUCOSE, CAPILLARY: Glucose-Capillary: 141 mg/dL — ABNORMAL HIGH (ref 70–99)

## 2012-02-11 LAB — CARDIAC PANEL(CRET KIN+CKTOT+MB+TROPI): Troponin I: <0.3 ng/mL (ref <0.30)

## 2012-02-11 MED ORDER — QUETIAPINE FUMARATE 25 MG PO TABS: 25.0000 mg | ORAL_TABLET | Freq: Two times a day (BID) | ORAL | Status: DC

## 2012-02-11 MED ORDER — CIPROFLOXACIN HCL 500 MG PO TABS: 500.0000 mg | ORAL_TABLET | Freq: Two times a day (BID) | ORAL | Status: AC

## 2012-02-11 MED ORDER — FUROSEMIDE 80 MG PO TABS: 40.0000 mg | ORAL_TABLET | Freq: Every day | ORAL | Status: DC

## 2012-02-11 MED ORDER — ONDANSETRON HCL 4 MG PO TABS: 4.0000 mg | ORAL_TABLET | Freq: Four times a day (QID) | ORAL | Status: DC | PRN

## 2012-02-11 MED ORDER — POTASSIUM CHLORIDE CRYS ER 20 MEQ PO TBCR
40.0000 meq | EXTENDED_RELEASE_TABLET | Freq: Once | ORAL | Status: AC
  Administered 2012-02-11: 40 meq via ORAL
  Filled 2012-02-11: qty 2

## 2012-02-11 MED ORDER — ALPRAZOLAM 0.25 MG PO TABS: 0.2500 mg | ORAL_TABLET | Freq: Every day | ORAL | Status: DC

## 2012-02-11 MED ORDER — ONDANSETRON HCL 4 MG/2ML IJ SOLN
4.0000 mg | Freq: Four times a day (QID) | INTRAMUSCULAR | Status: DC | PRN
  Filled 2012-02-11: qty 2

## 2012-02-11 MED ORDER — BOOST / RESOURCE BREEZE PO LIQD: 1.0000 | Freq: Two times a day (BID) | ORAL | Status: DC

## 2012-02-11 MED ORDER — FLUTICASONE PROPIONATE 50 MCG/ACT NA SUSP: 2.0000 | Freq: Every day | NASAL | Status: DC

## 2012-02-11 MED ORDER — BUSPIRONE HCL 10 MG PO TABS: 10.0000 mg | ORAL_TABLET | Freq: Three times a day (TID) | ORAL | Status: DC

## 2012-02-11 MED ORDER — POTASSIUM CHLORIDE 10 MEQ/100ML IV SOLN
10.0000 meq | INTRAVENOUS | Status: DC
  Administered 2012-02-11: 10 meq via INTRAVENOUS
  Filled 2012-02-11 (×3): qty 100

## 2012-02-11 NOTE — Discharge Summary (Addendum)
Physician Discharge Summary  April Baker ZOX:[id number] DOB: Oct 26, 1932 DOA: 02/06/2012  PCP: Nadean Corwin, MD  Admit date: 02/06/2012 Discharge date: 02/11/2012  Discharge Diagnoses:  Principal Problem:  *UTI (lower urinary tract infection) Active Problems:  A-fib  DM (diabetes mellitus)  Dementia  Parkinson disease  Leukocytosis  Dehydration  Hypokalemia  Acute kidney injury  Anemia of chronic disease  Functional quadriplegia  Discharge Condition: medically stable for discharge to SNF today; family and patient agree with discharge plan  Diet recommendation: as tolerated  History of present illness:  78 year old female with multiple medical issues including but not limited to atrial fibrillation, Parkinson's dementia, wheelchair down who was transferred from [facility] 02/06/2012 for acute kidney injury. In addition, patient was found to have UTI on this admission. Hospital course complicated by persistent hypotension which now may be patient's baseline as she does not appears to be septic.   Assessment/Plan:   Principal Problem:  *UTI (lower urinary tract infection)  - Will continue cipro on discharge - Urine culture shows no growth to date   Active Problems:  Atrial fibrillation  - Rate controlled with digoxin and Coreg   DM (diabetes mellitus)  - Controlled with linagliptin   Parkinson disease and dementia  - Will continue Sinemet   Leukocytosis  - Secondary to urinary tract infection  - management as above  Dehydration and hypotension  - SBP in 100's today - will continue fluid challenges PRN  - procalcitonin and lactic acid level - within normal limits   Hypokalemia  - Potassium repleted prior to discharge   Acute kidney injury  - Likely prerenal, secondary to UTI and dehydration  - Creatinine trending down  - stop lisinopril  Anemia of chronic disease  - Likely combination of poor oral intake, dementia  - Hemoglobin stable  - No signs of  active bleed   Functional quadriplegia  - Patient is wheelchair bound  - PT evaluation - to SNF - encourage out of bed to chair   Protein calorie malnutrition  - Nutrition consulted  - patient tolerates PO intake  Code Status: DO NOT RESUSCITATE  Family Communication: Family is not present at bedside  Disposition Plan: To assisted living facility today  Consultants:  Physical therapy Procedures:  None Antibiotics:  Rocephin 02/06/2012 -->02/11/2012 cipro for 5 days on discharge  Manson Passey, MD  [facility]  Pager [phone]  If 7PM-7AM, please contact night-coverage  [email or web address]  Password [email or web address]  02/10/2012, 9:30 AM    Discharge Exam: Filed Vitals:   02/11/12 0443  BP: 100/55  Pulse: 68  Temp: 98.4 F (36.9 C)  Resp: 16   Filed Vitals:   02/10/12 1012 02/10/12 1329 02/10/12 2121 02/11/12 0443  BP: 95/45 83/47 127/60 100/55  Pulse: 62 68 60 68  Temp: 98 F (36.7 C) 98.7 F (37.1 C) 98.9 F (37.2 C) 98.4 F (36.9 C)  TempSrc: Oral Oral Oral Oral  Resp: 20 18 18 16   Height:      Weight:      SpO2: 100% 99% 96% 96%    General: Pt is  not in acute distress Cardiovascular: Regular rate and rhythm, S1/S2 +, no murmurs, no rubs, no gallops Respiratory: Clear to auscultation bilaterally, no wheezing, no crackles, no rhonchi Abdominal: Soft, non tender, non distended, bowel sounds +, no guarding Extremities: no edema, no cyanosis, pulses palpable bilaterally DP and PT Neuro: Grossly nonfocal  Discharge Instructions  Discharge Orders    Future Orders Please Complete  By Expires   Diet - low sodium heart healthy      Increase activity slowly      Call MD for:  persistant nausea and vomiting      Call MD for:  redness, tenderness, or signs of infection (pain, swelling, redness, odor or green/yellow discharge around incision site)      Call MD for:  difficulty breathing, headache or visual disturbances      Call MD for:  persistant dizziness or  light-headedness      Discharge instructions      Comments:   1. please stop taking lisinopril as it may have contributed to kidney injury 2. please noted that Lasix dose was reduced to 40 mg daily due to persistent hypotension and kidney injury 3. patient may continue taking Cipro for urinary tract infection for additional 5 days on discharge     Medication List  As of 02/11/2012 11:49 AM   STOP taking these medications         lisinopril 2.5 MG tablet         TAKE these medications         ALPRAZolam 0.25 MG tablet   Commonly known as: XANAX   Take 1 tablet (0.25 mg total) by mouth at bedtime.      busPIRone 10 MG tablet   Commonly known as: BUSPAR   Take 1 tablet (10 mg total) by mouth 3 (three) times daily.      carbidopa-levodopa 25-100 MG per tablet   Commonly known as: SINEMET IR   Take 1 tablet by mouth 4 (four) times daily. 8 am, 12 pm, 4 pm, 9 pm      carvedilol 3.125 MG tablet   Commonly known as: COREG   Take 3.125 mg by mouth 2 (two) times daily with a meal.      cholecalciferol 1000 UNITS tablet   Commonly known as: VITAMIN D   Take 1,000 Units by mouth daily.      ciprofloxacin 500 MG tablet   Commonly known as: CIPRO   Take 1 tablet (500 mg total) by mouth 2 (two) times daily.      clopidogrel 75 MG tablet   Commonly known as: PLAVIX   Take 75 mg by mouth daily.      digoxin 0.125 MG tablet   Commonly known as: LANOXIN   Take 125 mcg by mouth daily.      docusate sodium 100 MG capsule   Commonly known as: COLACE   Take 100 mg by mouth 2 (two) times daily.      feeding supplement Liqd   Take 1 Container by mouth 2 (two) times daily after a meal.      fluticasone 50 MCG/ACT nasal spray   Commonly known as: FLONASE   Place 2 sprays into the nose daily.      furosemide 80 MG tablet   Commonly known as: LASIX   Take 0.5 tablets (40 mg total) by mouth daily.      polyethylene glycol packet   Commonly known as: MIRALAX / GLYCOLAX   Take 17 g  by mouth daily.      potassium chloride SA 20 MEQ tablet   Commonly known as: K-DUR,KLOR-CON   Take 20 mEq by mouth daily.      QUEtiapine 25 MG tablet   Commonly known as: SEROQUEL   Take 1 tablet (25 mg total) by mouth 2 (two) times daily.      sitaGLIPtin 50 MG tablet  Commonly known as: JANUVIA   Take 50 mg by mouth daily.           Follow-up Information    Follow up with MCKEOWN,WILLIAM DAVID, MD in 1 week.   Contact information:   [address] [place] [address] [phone]           The results of significant diagnostics from this hospitalization (including imaging, microbiology, ancillary and laboratory) are listed below for reference.    Significant Diagnostic Studies: Dg Chest Port 1 View  02/09/2012  *RADIOLOGY REPORT*  Clinical Data: 78 year old female left side chest pain, hypotension, possible sepsis.  PORTABLE CHEST - 1 VIEW  Comparison: 12/28/2011 and earlier.  Findings: Portable upright AP view at 1720 hours.  Stable sequelae of CABG. Stable cardiomegaly and mediastinal contours.  No pneumothorax, pulmonary edema or pleural effusion.  Increased hypoventilation at the left lung base.  No definite consolidation.  IMPRESSION: Increased retrocardiac atelectasis, otherwise no acute cardiopulmonary abnormality.   Original Report Authenticated By: Harley Hallmark, M.D.     Microbiology: Recent Results (from the past 240 hour(s))  URINE CULTURE     Status: Normal   Collection Time   02/06/12  8:45 PM      Component Value Range Status Comment   Specimen Description URINE, CLEAN CATCH   Final    Special Requests none at collection   ceftriaxone started Normal   Final    Culture  Setup Time 02/07/2012 13:28   Final    Colony Count NO GROWTH   Final    Culture NO GROWTH   Final    Report Status 02/07/2012 FINAL   Final   MRSA PCR SCREENING     Status: Normal   Collection Time   02/07/12  9:57 AM      Component Value Range Status  Comment   MRSA by PCR NEGATIVE  NEGATIVE Final      Labs: Basic Metabolic Panel:  Lab 02/11/12 8295 02/09/12 0539 02/08/12 0310 02/07/12 0354 02/06/12 2127  NA 140 142 140 138 133*  K 3.4* 3.5 3.4* 2.9* 3.8  CL 110 106 103 98 90*  CO2 24 27 28 28 28   GLUCOSE 97 143* 89 129* 209*  BUN 15 27* 39* 66* 78*  CREATININE 0.89 1.18* 1.21* 1.58* 1.80*  CALCIUM 8.2* 9.0 8.7 8.8 9.3  MG -- -- -- -- --  PHOS -- -- -- -- --   Liver Function Tests:  Lab 02/06/12 2127  AST 25  ALT <5  ALKPHOS 43  BILITOT 0.3  PROT 7.4  ALBUMIN 4.0   No results found for this basename: LIPASE:5,AMYLASE:5 in the last 168 hours No results found for this basename: AMMONIA:5 in the last 168 hours CBC:  Lab 02/11/12 0610 02/09/12 0539 02/08/12 0310 02/07/12 0354 02/06/12 2127  WBC 14.8* 18.4* 17.8* 19.4* 22.1*  HGB 8.1* 8.9* 8.7* 9.6* 10.7*  HCT 24.6* 27.7* 26.7* 28.1* 31.7*  MCV 84.0 86.0 84.2 82.4 82.3  PLT 208 223 224 245 286   Cardiac Enzymes:  Lab 02/11/12 0610 02/10/12 2240 02/06/12 2127  CKTOTAL 20 22 41  CKMB 2.0 2.1 2.9  CKMBINDEX -- -- --  TROPONINI <0.30 <0.30 --   BNP: BNP (last 3 results) No results found for this basename: PROBNP:3 in the last 8760 hours CBG:  Lab 02/11/12 0748 02/10/12 2125 02/10/12 1643 02/10/12 1143 02/10/12 0729  GLUCAP 105* 154* 138* 168* 87    Time coordinating discharge: Over 30 minutes  Signed:  Manson Passey, MD  [facility] 02/11/2012, 11:49 AM  Pager #: [phone]

## 2012-02-11 NOTE — Progress Notes (Signed)
Pt reported chest pain of 5/10 on the pain scale during night, notified the on call Dr Zannie Cove, new orders for 0.4 nitro, EKG, and cardiac enzymes were received and implemented, See MAR.  Pt tolerated well. Denies any pain after nitro was given. Pt  states she is feeling better this morning and has no chest pain.

## 2012-02-11 NOTE — Progress Notes (Signed)
CSW was consulted to complete discharge of patient. Pt to transfer to [facility] today via patient's daughter, Minda Meo [phone]). [facility] is aware of d/c. D/C packet complete with AVS, D/C summary, signed FL2, and signed hard Rx.  CSW signing off as no other CSW needs identified at this time.  Lia Foyer, LCSWA [facility] Clinical Social Worker Contact #: [phone] (weekend)

## 2012-02-12 LAB — URINE CULTURE: Colony Count: 30000

## 2012-02-15 ENCOUNTER — Emergency Department (HOSPITAL_COMMUNITY): Payer: Medicare Other

## 2012-02-15 ENCOUNTER — Emergency Department (HOSPITAL_COMMUNITY)
Admission: EM | Admit: 2012-02-15 | Discharge: 2012-02-15 | Discharge status: Against Medical Advice | Payer: Medicare Other | Attending: Emergency Medicine | Admitting: Emergency Medicine

## 2012-02-15 DIAGNOSIS — F068 Other specified mental disorders due to known physiological condition: Secondary | ICD-10-CM | POA: Insufficient documentation

## 2012-02-15 DIAGNOSIS — Z79899 Other long term (current) drug therapy: Secondary | ICD-10-CM | POA: Insufficient documentation

## 2012-02-15 DIAGNOSIS — S0993XA Unspecified injury of face, initial encounter: Secondary | ICD-10-CM | POA: Insufficient documentation

## 2012-02-15 DIAGNOSIS — E785 Hyperlipidemia, unspecified: Secondary | ICD-10-CM | POA: Insufficient documentation

## 2012-02-15 DIAGNOSIS — M1990 Unspecified osteoarthritis, unspecified site: Secondary | ICD-10-CM

## 2012-02-15 DIAGNOSIS — I4891 Unspecified atrial fibrillation: Secondary | ICD-10-CM | POA: Insufficient documentation

## 2012-02-15 DIAGNOSIS — W19XXXA Unspecified fall, initial encounter: Secondary | ICD-10-CM

## 2012-02-15 DIAGNOSIS — R51 Headache: Secondary | ICD-10-CM | POA: Insufficient documentation

## 2012-02-15 DIAGNOSIS — Z66 Do not resuscitate: Secondary | ICD-10-CM | POA: Insufficient documentation

## 2012-02-15 DIAGNOSIS — S0990XA Unspecified injury of head, initial encounter: Secondary | ICD-10-CM

## 2012-02-15 DIAGNOSIS — I2510 Atherosclerotic heart disease of native coronary artery without angina pectoris: Secondary | ICD-10-CM | POA: Insufficient documentation

## 2012-02-15 DIAGNOSIS — Z91199 Patient's noncompliance with other medical treatment and regimen due to unspecified reason: Secondary | ICD-10-CM

## 2012-02-15 DIAGNOSIS — G20A1 Parkinson's disease without dyskinesia, without mention of fluctuations: Secondary | ICD-10-CM | POA: Insufficient documentation

## 2012-02-15 DIAGNOSIS — I252 Old myocardial infarction: Secondary | ICD-10-CM | POA: Insufficient documentation

## 2012-02-15 DIAGNOSIS — S1093XA Contusion of unspecified part of neck, initial encounter: Secondary | ICD-10-CM | POA: Insufficient documentation

## 2012-02-15 DIAGNOSIS — R296 Repeated falls: Secondary | ICD-10-CM | POA: Insufficient documentation

## 2012-02-15 DIAGNOSIS — Z9119 Patient's noncompliance with other medical treatment and regimen: Secondary | ICD-10-CM | POA: Insufficient documentation

## 2012-02-15 DIAGNOSIS — E041 Nontoxic single thyroid nodule: Secondary | ICD-10-CM

## 2012-02-15 DIAGNOSIS — S199XXA Unspecified injury of neck, initial encounter: Secondary | ICD-10-CM

## 2012-02-15 DIAGNOSIS — G20 Parkinson's disease: Secondary | ICD-10-CM | POA: Insufficient documentation

## 2012-02-15 DIAGNOSIS — E119 Type 2 diabetes mellitus without complications: Secondary | ICD-10-CM | POA: Insufficient documentation

## 2012-02-15 DIAGNOSIS — S0003XA Contusion of scalp, initial encounter: Secondary | ICD-10-CM | POA: Insufficient documentation

## 2012-02-15 DIAGNOSIS — I10 Essential (primary) hypertension: Secondary | ICD-10-CM | POA: Insufficient documentation

## 2012-02-15 MED ORDER — SODIUM CHLORIDE 0.9 % IV BOLUS (SEPSIS): 250.0000 mL | Freq: Once | INTRAVENOUS | Status: DC

## 2012-02-15 MED ORDER — SODIUM CHLORIDE 0.9 % IV SOLN: INTRAVENOUS | Status: DC

## 2012-02-15 NOTE — ED Notes (Signed)
Pt. Refuses iv and blood draws. Wants out of here. Family at bedside and supports decisions

## 2012-02-15 NOTE — ED Provider Notes (Signed)
History     CSN: [id number]  Arrival date & time 02/15/12  0909   First MD Initiated Contact with Patient 02/15/12 [phone]      Chief Complaint  Patient presents with  . Fall    (Consider location/radiation/quality/duration/timing/severity/associated sxs/prior treatment) HPI Comments: April Baker is a 78 y.o. Female who stood up, her wheelchair and fell, striking the left side of her head. She complains of a knot on the left side of her head. There was no reported loss of consciousness. She lives at an assisted living facility. She reports that she is taking all of her medicines as usual, and not been sick. Her daughter, is with her and assists in giving history. The patient was discharged from the hospital 2 days ago after admission for malaise, renal insufficiency, and general weakness. Patient feels like the fall was "because I'm not supposed to walk." She denies other problems.  Patient is a 78 y.o. female presenting with fall. The history is provided by the patient and a relative.  Fall    Past Medical History  Diagnosis Date  . Hypertension   . Atrial fibrillation   . Parkinson's disease   . Myocardial infarction   . Coronary artery disease   . Hyperlipidemia   . Dementia   . Diabetes mellitus   . Anxiety   . MVP (mitral valve prolapse)     History of  . Unilateral carotid artery stenosis   . Wrist fracture, right   . Peripheral neuropathy   . Lower extremity numbness     Past Surgical History  Procedure Date  . Inner ear surgery 1985    with metal placement-no mri  . Cesarean section 1964  . Wrist reconstruction 1991  . Anal fistulotomy 08/12/2000  . Coronary artery bypass graft   . Cardiac catheterization   . Sternal wire removal 10/29/2003    I&D of 2 upper sternal wires    No family history on file.  History  Substance Use Topics  . Smoking status: Never Smoker   . Smokeless tobacco: Never Used  . Alcohol Use: No    OB History    Grav Para Term  Preterm Abortions TAB SAB Ect Mult Living                  Review of Systems  All other systems reviewed and are negative.    Allergies  Latex  Home Medications   Current Outpatient Rx  Name Route Sig Dispense Refill  . ALPRAZOLAM 0.25 MG PO TABS Oral Take 1 tablet (0.25 mg total) by mouth at bedtime. 30 tablet 0  . BUSPIRONE HCL 10 MG PO TABS Oral Take 1 tablet (10 mg total) by mouth 3 (three) times daily. 90 tablet 0  . CARBIDOPA-LEVODOPA 25-100 MG PO TABS Oral Take 1 tablet by mouth 4 (four) times daily. 8 am, 12 pm, 4 pm, 9 pm    . CARVEDILOL 3.125 MG PO TABS Oral Take 3.125 mg by mouth 2 (two) times daily with a meal.    . VITAMIN D 1000 UNITS PO TABS Oral Take 1,000 Units by mouth daily.     Marland Kitchen CIPROFLOXACIN HCL 500 MG PO TABS Oral Take 1 tablet (500 mg total) by mouth 2 (two) times daily. 10 tablet 0  . CLOPIDOGREL BISULFATE 75 MG PO TABS Oral Take 75 mg by mouth daily.      Marland Kitchen DIGOXIN 0.125 MG PO TABS Oral Take 125 mcg by mouth daily.      Marland Kitchen  DOCUSATE SODIUM 100 MG PO CAPS Oral Take 100 mg by mouth 2 (two) times daily as needed. For constipation    . ENSURE PLUS PO LIQD Oral Take 237 mLs by mouth 2 (two) times daily between meals. chocolate    . FLUTICASONE PROPIONATE 50 MCG/ACT NA SUSP Nasal Place 2 sprays into the nose daily. 1 g 0  . FUROSEMIDE 40 MG PO TABS Oral Take 40 mg by mouth daily.    Marland Kitchen NITROGLYCERIN 0.4 MG SL SUBL Sublingual Place 0.4 mg under the tongue every 5 (five) minutes as needed. For chest pain    . POLYETHYLENE GLYCOL 3350 PO PACK Oral Take 17 g by mouth daily.      Marland Kitchen POTASSIUM CHLORIDE CRYS ER 20 MEQ PO TBCR Oral Take 20 mEq by mouth daily.     . QUETIAPINE FUMARATE 25 MG PO TABS Oral Take 1 tablet (25 mg total) by mouth 2 (two) times daily. 60 tablet 0  . SITAGLIPTIN PHOSPHATE 50 MG PO TABS Oral Take 50 mg by mouth daily.        BP 108/61  Pulse 62  Temp 97 F (36.1 C) (Oral)  Resp 16  SpO2 97%  Physical Exam  Nursing note and vitals  reviewed. Constitutional: She is oriented to person, place, and time. She appears well-developed and well-nourished.  HENT:  Head: Normocephalic.       Tender left occiput, with mild swelling, no associated abrasion or laceration  Eyes: Conjunctivae and EOM are normal. Pupils are equal, round, and reactive to light.  Neck: Normal range of motion and phonation normal. No thyromegaly present.  Cardiovascular: Normal rate, regular rhythm and intact distal pulses.   Pulmonary/Chest: Effort normal. She exhibits no tenderness.       Mild bilateral rib tenderness, without crepitation  Abdominal: Soft. She exhibits no distension. There is no tenderness. There is no guarding.  Musculoskeletal: Normal range of motion. She exhibits no edema and no tenderness.  Neurological: She is alert and oriented to person, place, and time. She has normal strength. She exhibits normal muscle tone.  Skin: Skin is warm and dry.  Psychiatric: Her behavior is normal.       Anxious    ED Course  Procedures (including critical care time)  The  patient refused plain film imaging, IV, treatment, and laboratory evaluations. Her daughter attempted to get her to do these things, but the patient continued to refuse.  Shortly after returning from radiology, and getting her CT scans, the patient demanded to leave. I attempted to convince her to stay. Daughter was with her at the time, and stated that the patient has "done this before." The patient is reported to be DO NOT RESUSCITATE. She threatened to "write your name down on paper."   Date: 02/15/2012  Rate: 59  Rhythm: normal sinus rhythm  QRS Axis: normal  Intervals: normal  ST/T Wave abnormalities: nonspecific T wave changes  Conduction Disutrbances:none  Narrative Interpretation:   Old EKG Reviewed: unchanged    Labs Reviewed  GLUCOSE, CAPILLARY   Ct Head Wo Contrast  02/15/2012  *RADIOLOGY REPORT*  Clinical Data:   Fall.  Hematoma to back of head.  Headache  and occipital region.  CT HEAD WITHOUT CONTRAST  Technique: Contiguous axial images were obtained from the base of the skull through the vertex without contrast  Comparison: Head CT 03/07/2011.  Cervical spine and head CTs of 12/26/2010.  Findings:  Bone windows demonstrate mild left paracentral occipital scalp soft tissue  swelling on image 28 of series 3.  Mild hyperostosis frontalis interna.  No underlying skull fracture. Clear paranasal sinuses and mastoid air cells.  Soft tissue windows demonstrate no  mass lesion, hemorrhage, hydrocephalus, acute infarct, intra-axial, or extra-axial fluid collection.  IMPRESSION:  1.  Left occipital scalp soft tissue swelling, mild. 2. No acute intracranial abnormality.  CT CERVICAL SPINE WITHOUT CONTRAST  Technique: Continous axial images were obtained of the cervical spine without contrast.  Sagittal and coronal reformats were constructed.  Findings:  Spinal visualization through the bottom of T2. Prevertebral soft tissues are within normal limits.  Dental artifact is a mild to moderate at the C2-C3 level.  Right-sided carotid atherosclerosis.  1.2 cm right-sided thyroid nodule.  No apical pneumothorax. Multilevel spondylosis.  This results in areas of bilateral neural foraminal narrowing and central canal stenosis.  The central canal stenosis is most apparent at C3-C4 and C5-C6.  Skull base intact.  Incomplete fusion of the posterior arch of C1, normal anatomic variant.  Marked degenerative disc disease at C5- C6.  C4-C5 trace anterolisthesis is similar to on the prior, and likely degenerative.  IMPRESSION:  1.  Multilevel spondylosis, without acute finding in the cervical spine. 2.  Trace C4-C5 anterolisthesis, similar to on the prior, and likely degenerative. 3.  1.3 cm right-sided thyroid nodule.   Consider further evaluation with thyroid ultrasound.  If patient is clinically hyperthyroid, consider nuclear medicine thyroid uptake and scan.   Original Report Authenticated  By: Consuello Bossier, M.D.    Ct Cervical Spine Wo Contrast  02/15/2012  *RADIOLOGY REPORT*  Clinical Data:   Fall.  Hematoma to back of head.  Headache and occipital region.  CT HEAD WITHOUT CONTRAST  Technique: Contiguous axial images were obtained from the base of the skull through the vertex without contrast  Comparison: Head CT 03/07/2011.  Cervical spine and head CTs of 12/26/2010.  Findings:  Bone windows demonstrate mild left paracentral occipital scalp soft tissue swelling on image 28 of series 3.  Mild hyperostosis frontalis interna.  No underlying skull fracture. Clear paranasal sinuses and mastoid air cells.  Soft tissue windows demonstrate no  mass lesion, hemorrhage, hydrocephalus, acute infarct, intra-axial, or extra-axial fluid collection.  IMPRESSION:  1.  Left occipital scalp soft tissue swelling, mild. 2. No acute intracranial abnormality.  CT CERVICAL SPINE WITHOUT CONTRAST  Technique: Continous axial images were obtained of the cervical spine without contrast.  Sagittal and coronal reformats were constructed.  Findings:  Spinal visualization through the bottom of T2. Prevertebral soft tissues are within normal limits.  Dental artifact is a mild to moderate at the C2-C3 level.  Right-sided carotid atherosclerosis.  1.2 cm right-sided thyroid nodule.  No apical pneumothorax. Multilevel spondylosis.  This results in areas of bilateral neural foraminal narrowing and central canal stenosis.  The central canal stenosis is most apparent at C3-C4 and C5-C6.  Skull base intact.  Incomplete fusion of the posterior arch of C1, normal anatomic variant.  Marked degenerative disc disease at C5- C6.  C4-C5 trace anterolisthesis is similar to on the prior, and likely degenerative.  IMPRESSION:  1.  Multilevel spondylosis, without acute finding in the cervical spine. 2.  Trace C4-C5 anterolisthesis, similar to on the prior, and likely degenerative. 3.  1.3 cm right-sided thyroid nodule.   Consider further  evaluation with thyroid ultrasound.  If patient is clinically hyperthyroid, consider nuclear medicine thyroid uptake and scan.   Original Report Authenticated By: Consuello Bossier, M.D.  1. Fall   2. Head injury   3. Neck injury   4. Degenerative joint disease   5. Thyroid nodule   6. H/O noncompliance with medical treatment, presenting hazards to health       MDM  Fall without clear cause, incompletely evaluated. In emergency department. Patient is agitated, and demanded to leave AGAINST MEDICAL ADVICE. The patient's daughter, was with her and did not resist the patient's departure, or materially assist in convincing her to stay while I was in the room.          Flint Melter, MD 02/15/12 1246

## 2012-02-15 NOTE — ED Notes (Signed)
Fell from standing position while getting dressed, hitting her head. Hematoma to back of her head, radiating pain down c spine, refused treatment, no neurological deficits. Alert and oriented.

## 2012-02-15 NOTE — ED Notes (Signed)
Pt. Given benzo. Last night ~2100; pt. Taken Cipro.

## 2012-02-15 NOTE — ED Notes (Signed)
Just d/c from [facility] for renal failure and uti. And normally keeps her eyes closed r/t parkinson's per family.

## 2012-02-15 NOTE — ED Notes (Signed)
Pt. Wants to leave now; aware of surroundings and reason; poa helping pt. Getting ready. According to POA, "pt. Tired of ivs, etc. Recent d/c from [facility]. Dr. Effie Shy at bedside only shared ct scan results. She is aware of risks and benefits of refusing.

## 2012-02-15 NOTE — ED Notes (Signed)
Pt. From [facility]

## 2012-02-15 NOTE — ED Notes (Signed)
cbg of 84 reported to ed white.

## 2012-04-25 ENCOUNTER — Emergency Department (HOSPITAL_COMMUNITY)
Admission: EM | Admit: 2012-04-25 | Discharge: 2012-04-25 | Disposition: A | Discharge status: Skilled Nursing Facility | Payer: Medicare Other | Attending: Emergency Medicine | Admitting: Emergency Medicine

## 2012-04-25 ENCOUNTER — Emergency Department (HOSPITAL_COMMUNITY): Payer: Medicare Other

## 2012-04-25 ENCOUNTER — Encounter (HOSPITAL_COMMUNITY): Payer: Self-pay | Admitting: *Deleted

## 2012-04-25 DIAGNOSIS — Z79899 Other long term (current) drug therapy: Secondary | ICD-10-CM | POA: Insufficient documentation

## 2012-04-25 DIAGNOSIS — E785 Hyperlipidemia, unspecified: Secondary | ICD-10-CM | POA: Insufficient documentation

## 2012-04-25 DIAGNOSIS — I2510 Atherosclerotic heart disease of native coronary artery without angina pectoris: Secondary | ICD-10-CM | POA: Insufficient documentation

## 2012-04-25 DIAGNOSIS — W050XXA Fall from non-moving wheelchair, initial encounter: Secondary | ICD-10-CM | POA: Insufficient documentation

## 2012-04-25 DIAGNOSIS — G20 Parkinson's disease: Secondary | ICD-10-CM | POA: Insufficient documentation

## 2012-04-25 DIAGNOSIS — E119 Type 2 diabetes mellitus without complications: Secondary | ICD-10-CM | POA: Insufficient documentation

## 2012-04-25 DIAGNOSIS — G20A1 Parkinson's disease without dyskinesia, without mention of fluctuations: Secondary | ICD-10-CM | POA: Insufficient documentation

## 2012-04-25 DIAGNOSIS — I4891 Unspecified atrial fibrillation: Secondary | ICD-10-CM | POA: Insufficient documentation

## 2012-04-25 DIAGNOSIS — Y929 Unspecified place or not applicable: Secondary | ICD-10-CM | POA: Insufficient documentation

## 2012-04-25 DIAGNOSIS — I10 Essential (primary) hypertension: Secondary | ICD-10-CM | POA: Insufficient documentation

## 2012-04-25 DIAGNOSIS — Z8781 Personal history of (healed) traumatic fracture: Secondary | ICD-10-CM | POA: Insufficient documentation

## 2012-04-25 DIAGNOSIS — S60229A Contusion of unspecified hand, initial encounter: Secondary | ICD-10-CM

## 2012-04-25 DIAGNOSIS — F0390 Unspecified dementia without behavioral disturbance: Secondary | ICD-10-CM | POA: Insufficient documentation

## 2012-04-25 DIAGNOSIS — F411 Generalized anxiety disorder: Secondary | ICD-10-CM | POA: Insufficient documentation

## 2012-04-25 DIAGNOSIS — I252 Old myocardial infarction: Secondary | ICD-10-CM | POA: Insufficient documentation

## 2012-04-25 DIAGNOSIS — Y939 Activity, unspecified: Secondary | ICD-10-CM | POA: Insufficient documentation

## 2012-04-25 DIAGNOSIS — S0990XA Unspecified injury of head, initial encounter: Secondary | ICD-10-CM | POA: Insufficient documentation

## 2012-04-25 DIAGNOSIS — Z86718 Personal history of other venous thrombosis and embolism: Secondary | ICD-10-CM | POA: Insufficient documentation

## 2012-04-25 HISTORY — DX: Nonrheumatic mitral (valve) insufficiency: I34.0

## 2012-04-25 HISTORY — DX: Elevated white blood cell count, unspecified: D72.829

## 2012-04-25 NOTE — ED Notes (Signed)
Pt was sitting in the wheelchair at the [facility] facility; staff reports that she slid out of the wheelchair and then fell back and struck head on floor; no LOC; pt refused C-Collar and LSB with EMS; pt did not want to come for evaluation; Nursing Home was insistent on evaluation; pt denies pain at present and states "I am not agreeing to this admission"

## 2012-04-25 NOTE — ED Notes (Signed)
Pt slid out of wheelchair in to floor per then fell back and struck head per NH staff; pt denied complaint upon arrival; pt refused C-Collar and LSB with EMS.

## 2012-04-25 NOTE — ED Notes (Signed)
Pt back from CT

## 2012-04-25 NOTE — ED Provider Notes (Signed)
History    78 year old female sent to the ER for evaluation after fall. Patient a wheelchair and fell backwards. She did strike the back of her head. No loss of consciousness. Patient denies any significant headache. Denies any neck or back pain. She's complaining is mild right hand pain. Patient is on Plavix. No other blood thinning medications. Patient is at baseline mental status per her daughter who is at bedside. No acute visual changes. No n/v. No acute numbness, tingling or loss of strength.  CSN: [id number]  Arrival date & time 04/25/12  4098   First MD Initiated Contact with Patient 04/25/12 2220      Chief Complaint  Patient presents with  . Fall    (Consider location/radiation/quality/duration/timing/severity/associated sxs/prior treatment) HPI  Past Medical History  Diagnosis Date  . Hypertension   . Atrial fibrillation   . Parkinson's disease   . Myocardial infarction   . Coronary artery disease   . Hyperlipidemia   . Dementia   . Diabetes mellitus   . Anxiety   . MVP (mitral valve prolapse)     History of  . Unilateral carotid artery stenosis   . Wrist fracture, right   . Peripheral neuropathy   . Lower extremity numbness   . Mitral valve regurgitation   . Leukocytosis     Past Surgical History  Procedure Date  . Inner ear surgery 1985    with metal placement-no mri  . Cesarean section 1964  . Wrist reconstruction 1991  . Anal fistulotomy 08/12/2000  . Coronary artery bypass graft   . Cardiac catheterization   . Sternal wire removal 10/29/2003    I&D of 2 upper sternal wires  . Coronary angioplasty with stent placement     No family history on file.  History  Substance Use Topics  . Smoking status: Never Smoker   . Smokeless tobacco: Never Used  . Alcohol Use: No    OB History    Grav Para Term Preterm Abortions TAB SAB Ect Mult Living                  Review of Systems   Review of symptoms negative unless otherwise noted in  HPI.   Allergies  Latex  Home Medications   Current Outpatient Rx  Name  Route  Sig  Dispense  Refill  . ALPRAZOLAM 0.25 MG PO TABS   Oral   Take 1 tablet (0.25 mg total) by mouth at bedtime.   30 tablet   0   . BUSPIRONE HCL 10 MG PO TABS   Oral   Take 1 tablet (10 mg total) by mouth 3 (three) times daily.   90 tablet   0   . CARBIDOPA-LEVODOPA 25-100 MG PO TABS   Oral   Take 1 tablet by mouth 4 (four) times daily. 8 am, 12 pm, 4 pm, 9 pm         . CARVEDILOL 3.125 MG PO TABS   Oral   Take 3.125 mg by mouth 2 (two) times daily with a meal.         . VITAMIN D 1000 UNITS PO TABS   Oral   Take 1,000 Units by mouth daily.          Marland Kitchen CIPROFLOXACIN HCL 500 MG PO TABS   Oral   Take 500 mg by mouth 2 (two) times daily.         Marland Kitchen CLOPIDOGREL BISULFATE 75 MG PO TABS   Oral  Take 75 mg by mouth daily.           Marland Kitchen CRANBERRY 425 MG PO CAPS   Oral   Take 1 capsule by mouth daily.         Marland Kitchen DIGOXIN 0.125 MG PO TABS   Oral   Take 125 mcg by mouth daily.           Marland Kitchen DOCUSATE SODIUM 100 MG PO CAPS   Oral   Take 100 mg by mouth 2 (two) times daily as needed. For constipation         . ENSURE PLUS PO LIQD   Oral   Take 237 mLs by mouth 2 (two) times daily between meals. chocolate         . FUROSEMIDE 40 MG PO TABS   Oral   Take 40 mg by mouth daily.         Marland Kitchen NITROGLYCERIN 0.4 MG SL SUBL   Sublingual   Place 0.4 mg under the tongue every 5 (five) minutes as needed. For chest pain         . POLYETHYLENE GLYCOL 3350 PO PACK   Oral   Take 17 g by mouth daily.           Marland Kitchen POTASSIUM CHLORIDE CRYS ER 20 MEQ PO TBCR   Oral   Take 20 mEq by mouth daily.          . QUETIAPINE FUMARATE 25 MG PO TABS   Oral   Take 1 tablet (25 mg total) by mouth 2 (two) times daily.   60 tablet   0   . SITAGLIPTIN PHOSPHATE 50 MG PO TABS   Oral   Take 50 mg by mouth daily.             BP 139/60  Pulse 69  Temp 98.3 F (36.8 C) (Oral)  Resp 18   SpO2 99%  Physical Exam  Nursing note and vitals reviewed. Constitutional: She appears well-developed and well-nourished. No distress.  HENT:  Head: Normocephalic and atraumatic.  Eyes: Conjunctivae normal are normal. Pupils are equal, round, and reactive to light. Right eye exhibits no discharge. Left eye exhibits no discharge.  Neck: Neck supple.  Cardiovascular: Normal rate, regular rhythm and normal heart sounds.  Exam reveals no gallop and no friction rub.   No murmur heard. Pulmonary/Chest: Effort normal and breath sounds normal. No respiratory distress. She exhibits no tenderness.  Abdominal: Soft. She exhibits no distension. There is no tenderness.  Musculoskeletal: She exhibits no edema and no tenderness.       No midline spinal tenderness. Mild tenderness distal wrist. Skin is intact. Neurovascular intact distally. No bony tenderness of the extremities elsewhere. Pelvis is stable to rocking. No significant pain with range of motion of the hips.  Neurological: She is alert.       Speech is clear. Content is mostly appropriate. Patient is has mild confusion though. She follows commands. CN 2 through 12 are intact. Strength is 5 out of 5 bilateral upper lower extremities. Sensation intact to light touch.  Skin: Skin is warm and dry.  Psychiatric: She has a normal mood and affect. Her behavior is normal. Thought content normal.    ED Course  Procedures (including critical care time)  Labs Reviewed - No data to display Dg Wrist Complete Right  04/25/2012  *RADIOLOGY REPORT*  Clinical Data: Right wrist pain and swelling, status post fall.  RIGHT WRIST - COMPLETE 3+ VIEW  Comparison: Right  wrist radiograph performed 07/13/2010  Findings: There is no evidence of acute fracture.  A displaced ulnar styloid fragment reflects the remote fracture.  Hardware along the distal radius appears intact, without evidence of loosening.  There is associated chronic deformity involving the distal radius  and ulna.  There is diffuse osteopenia of visualized osseous structures.  The carpal rows appear grossly intact, though there is suggestion of multiple subcortical cysts.  Mild soft tissue swelling is noted about the wrist.  IMPRESSION:  1.  No evidence of acute fracture. 2.  Hardware along the distal radius appears intact, without evidence of loosening; chronic deformity involving the distal radius and ulna, and displaced ulnar styloid fragment, reflect remote healed fractures. 3.  Diffuse osteopenia of visualized osseous structures.   Original Report Authenticated By: Tonia Ghent, M.D.    Ct Head Wo Contrast  04/25/2012  *RADIOLOGY REPORT*  Clinical Data: Post fall, on Plavix  CT HEAD WITHOUT CONTRAST  Technique:  Contiguous axial images were obtained from the base of the skull through the vertex without contrast.  Comparison: 02/15/2012; 03/07/2011  Findings:  Wallace Cullens white differentiation is maintained.  No CT evidence of acute large territory infarct.  No intraparenchymal or extra-axial mass or hemorrhage.  Unchanged size and configuration of the ventricles and basilar cisterns.  No midline shift.  Atherosclerotic calcifications within the bilateral carotid siphons.  Air-fluid levels are seen within the left maxillary and sphenoid sinuses.  There is scattered opacification of the left ethmoidal air cells.  The remaining paranasal sinuses and mastoid air cells are normally aerated.  Regional soft tissues are normal.  No displaced calvarial fracture.  IMPRESSION: 1.  Negative noncontrast head CT. 2.  Sinus disease with air fluid levels within the left maxillary and sphenoid sinuses.   Original Report Authenticated By: Tacey Ruiz, MD      1. Head injury   2. Contusion, hand       MDM  78 year old female with mechanical fall. She did strike her head. Nonfocal neurological examination. Baseline mental status per her daughter. CT imaging of her head without any acute abnormality. X-rays of her hand  show evidence of chronic fracture & previous hardware placement. Does not appear to be any acute injury. Feel that patient is appropriate for discharge. Return precautions discussed. Outpatient followup as needed otherwise.        Raeford Razor, MD 04/25/12 2241

## 2012-05-23 DIAGNOSIS — R269 Unspecified abnormalities of gait and mobility: Secondary | ICD-10-CM | POA: Insufficient documentation

## 2012-06-24 ENCOUNTER — Encounter (HOSPITAL_COMMUNITY): Payer: Self-pay

## 2012-06-24 ENCOUNTER — Emergency Department (HOSPITAL_COMMUNITY): Payer: Medicare PPO

## 2012-06-24 ENCOUNTER — Emergency Department (HOSPITAL_COMMUNITY)
Admission: EM | Admit: 2012-06-24 | Discharge: 2012-06-25 | Disposition: A | Discharge status: Home / Self Care | Payer: Medicare PPO | Attending: Emergency Medicine | Admitting: Emergency Medicine

## 2012-06-24 DIAGNOSIS — Z8679 Personal history of other diseases of the circulatory system: Secondary | ICD-10-CM | POA: Insufficient documentation

## 2012-06-24 DIAGNOSIS — IMO0002 Reserved for concepts with insufficient information to code with codable children: Secondary | ICD-10-CM | POA: Insufficient documentation

## 2012-06-24 DIAGNOSIS — Z8739 Personal history of other diseases of the musculoskeletal system and connective tissue: Secondary | ICD-10-CM | POA: Insufficient documentation

## 2012-06-24 DIAGNOSIS — F0390 Unspecified dementia without behavioral disturbance: Secondary | ICD-10-CM | POA: Insufficient documentation

## 2012-06-24 DIAGNOSIS — Y9389 Activity, other specified: Secondary | ICD-10-CM | POA: Insufficient documentation

## 2012-06-24 DIAGNOSIS — E119 Type 2 diabetes mellitus without complications: Secondary | ICD-10-CM | POA: Insufficient documentation

## 2012-06-24 DIAGNOSIS — W19XXXA Unspecified fall, initial encounter: Secondary | ICD-10-CM | POA: Insufficient documentation

## 2012-06-24 DIAGNOSIS — I6529 Occlusion and stenosis of unspecified carotid artery: Secondary | ICD-10-CM | POA: Insufficient documentation

## 2012-06-24 DIAGNOSIS — Y9210 Unspecified residential institution as the place of occurrence of the external cause: Secondary | ICD-10-CM | POA: Insufficient documentation

## 2012-06-24 DIAGNOSIS — G20 Parkinson's disease: Secondary | ICD-10-CM | POA: Insufficient documentation

## 2012-06-24 DIAGNOSIS — E785 Hyperlipidemia, unspecified: Secondary | ICD-10-CM | POA: Insufficient documentation

## 2012-06-24 DIAGNOSIS — I4891 Unspecified atrial fibrillation: Secondary | ICD-10-CM | POA: Insufficient documentation

## 2012-06-24 DIAGNOSIS — I252 Old myocardial infarction: Secondary | ICD-10-CM | POA: Insufficient documentation

## 2012-06-24 DIAGNOSIS — G20A1 Parkinson's disease without dyskinesia, without mention of fluctuations: Secondary | ICD-10-CM | POA: Insufficient documentation

## 2012-06-24 DIAGNOSIS — I10 Essential (primary) hypertension: Secondary | ICD-10-CM | POA: Insufficient documentation

## 2012-06-24 DIAGNOSIS — D72829 Elevated white blood cell count, unspecified: Secondary | ICD-10-CM | POA: Insufficient documentation

## 2012-06-24 DIAGNOSIS — T148XXA Other injury of unspecified body region, initial encounter: Secondary | ICD-10-CM

## 2012-06-24 DIAGNOSIS — N059 Unspecified nephritic syndrome with unspecified morphologic changes: Secondary | ICD-10-CM | POA: Insufficient documentation

## 2012-06-24 DIAGNOSIS — F411 Generalized anxiety disorder: Secondary | ICD-10-CM | POA: Insufficient documentation

## 2012-06-24 DIAGNOSIS — I2510 Atherosclerotic heart disease of native coronary artery without angina pectoris: Secondary | ICD-10-CM | POA: Insufficient documentation

## 2012-06-24 MED ORDER — TRAMADOL HCL 50 MG PO TABS
50.0000 mg | ORAL_TABLET | Freq: Once | ORAL | Status: AC
  Administered 2012-06-24: 50 mg via ORAL
  Filled 2012-06-24: qty 1

## 2012-06-24 MED ORDER — TRAMADOL HCL 50 MG PO TABS: 50.0000 mg | ORAL_TABLET | Freq: Four times a day (QID) | ORAL | Status: DC | PRN

## 2012-06-24 MED ORDER — HYDROCODONE-ACETAMINOPHEN 5-325 MG PO TABS
1.0000 | ORAL_TABLET | Freq: Once | ORAL | Status: AC
  Administered 2012-06-24: 1 via ORAL
  Filled 2012-06-24: qty 1

## 2012-06-24 NOTE — ED Notes (Signed)
Patient from [facility] was found by staff on floor at doorway beside wheelchair.  Oriented x 2 which is her norm. No signs of trauma.  Patient reports back and neck pain.

## 2012-06-24 NOTE — ED Notes (Signed)
MD at bedside. 

## 2012-06-24 NOTE — ED Notes (Addendum)
Patient transported to CT 

## 2012-06-24 NOTE — ED Provider Notes (Signed)
History     CSN: [id number]  Arrival date & time 06/24/12  2107   First MD Initiated Contact with Patient 06/24/12 2147      Chief Complaint  Patient presents with  . Fall   HPI  History provided by the patient and nursing home report. Patient is a 79 year old female with multiple medical problems who presents from [facility] after being found on the floor next to her wheelchair. Patient states that she was trying to put her foot out to get the door and fell out of her wheelchair. She complains of back and neck pains. Denies having LOC. Denies any chest pain, shortness of breath or heart palpitations. Denies any pain to extremities. Per nursing home report patient is at her baseline mentation. Patient has history of multiple prior similar falls. Patient was transferred portable by EMS and placed in c-collar and spine support. No other treatments were provided.    Past Medical History  Diagnosis Date  . Hypertension   . Atrial fibrillation   . Parkinson's disease   . Myocardial infarction   . Coronary artery disease   . Hyperlipidemia   . Dementia   . Diabetes mellitus   . Anxiety   . MVP (mitral valve prolapse)     History of  . Unilateral carotid artery stenosis   . Wrist fracture, right   . Peripheral neuropathy   . Lower extremity numbness   . Mitral valve regurgitation   . Leukocytosis     Past Surgical History  Procedure Date  . Inner ear surgery 1985    with metal placement-no mri  . Cesarean section 1964  . Wrist reconstruction 1991  . Anal fistulotomy 08/12/2000  . Coronary artery bypass graft   . Cardiac catheterization   . Sternal wire removal 10/29/2003    I&D of 2 upper sternal wires  . Coronary angioplasty with stent placement     No family history on file.  History  Substance Use Topics  . Smoking status: Never Smoker   . Smokeless tobacco: Never Used  . Alcohol Use: No    OB History    Grav Para Term Preterm Abortions TAB  SAB Ect Mult Living                  Review of Systems  HENT: Positive for neck pain.   Respiratory: Negative for shortness of breath.   Cardiovascular: Negative for chest pain.  Gastrointestinal: Negative for abdominal pain.  Musculoskeletal: Positive for back pain.  Neurological: Negative for headaches.  All other systems reviewed and are negative.    Allergies  Latex  Home Medications   Current Outpatient Rx  Name  Route  Sig  Dispense  Refill  . ALPRAZOLAM 0.25 MG PO TABS   Oral   Take 1 tablet (0.25 mg total) by mouth at bedtime.   30 tablet   0   . BUSPIRONE HCL 10 MG PO TABS   Oral   Take 1 tablet (10 mg total) by mouth 3 (three) times daily.   90 tablet   0   . CARBIDOPA-LEVODOPA 25-100 MG PO TABS   Oral   Take 1 tablet by mouth 4 (four) times daily. 8 am, 12 pm, 4 pm, 9 pm         . CARVEDILOL 3.125 MG PO TABS   Oral   Take 3.125 mg by mouth 2 (two) times daily with a meal.         .  VITAMIN D 1000 UNITS PO TABS   Oral   Take 1,000 Units by mouth daily.          Marland Kitchen CLOPIDOGREL BISULFATE 75 MG PO TABS   Oral   Take 75 mg by mouth daily.           Marland Kitchen CRANBERRY 425 MG PO CAPS   Oral   Take 1 capsule by mouth daily.         Marland Kitchen DIGOXIN 0.125 MG PO TABS   Oral   Take 125 mcg by mouth daily.           Marland Kitchen DOCUSATE SODIUM 100 MG PO CAPS   Oral   Take 100 mg by mouth 2 (two) times daily as needed. For constipation         . FUROSEMIDE 40 MG PO TABS   Oral   Take 40 mg by mouth daily.         Marland Kitchen NITROGLYCERIN 0.4 MG SL SUBL   Sublingual   Place 0.4 mg under the tongue every 5 (five) minutes as needed. For chest pain         . POLYETHYLENE GLYCOL 3350 PO PACK   Oral   Take 17 g by mouth daily.           Marland Kitchen POTASSIUM CHLORIDE CRYS ER 20 MEQ PO TBCR   Oral   Take 20 mEq by mouth daily.          . QUETIAPINE FUMARATE 25 MG PO TABS   Oral   Take 1 tablet (25 mg total) by mouth 2 (two) times daily.   60 tablet   0   .  SITAGLIPTIN PHOSPHATE 50 MG PO TABS   Oral   Take 50 mg by mouth daily.             BP 146/48  Pulse 63  Temp 98.1 F (36.7 C) (Oral)  Resp 16  SpO2 97%  Physical Exam  Nursing note and vitals reviewed. Constitutional: She is oriented to person, place, and time. She appears well-developed and well-nourished. No distress.  HENT:  Head: Normocephalic and atraumatic.  Neck: Neck supple.       C. collar in place  Cardiovascular: Normal rate and regular rhythm.   Pulmonary/Chest: Effort normal and breath sounds normal. No respiratory distress. She has no wheezes. She has no rales. She exhibits no tenderness.  Abdominal: Soft. There is no tenderness. There is no rebound.  Musculoskeletal:       Lumbar back: She exhibits tenderness. She exhibits no deformity.       Back:  Neurological: She is alert and oriented to person, place, and time.  Skin: Skin is warm and dry. No rash noted.  Psychiatric: She has a normal mood and affect. Her behavior is normal.    ED Course  Procedures  Labs Reviewed - No data to display Dg Thoracic Spine 2 View  06/24/2012  *RADIOLOGY REPORT*  Clinical Data: Fall.  Back pain.  THORACIC SPINE - 2 VIEW  Comparison: CT chest 12/28/2011.  Findings: There is no fracture or subluxation of the thoracic spine.  Postoperative change of median sternotomy is noted.  IMPRESSION: No acute finding.   Original Report Authenticated By: Holley Dexter, M.D.    Dg Lumbar Spine Complete  06/24/2012  *RADIOLOGY REPORT*  Clinical Data: Fall.  Back pain.  LUMBAR SPINE - COMPLETE 4+ VIEW  Comparison: CT chest, abdomen and pelvis 09/15/2010.  Plain films lumbar spine 02/22/2009.  Findings: Convex left scoliosis is noted.  There is no fracture or subluxation of the lumbar spine.  Multilevel lumbar degenerative change appears worst at L4-5.  Reversal of the normal lumbar lordosis is noted.  IMPRESSION: No acute finding.  Multilevel degenerative change.   Original Report  Authenticated By: Holley Dexter, M.D.    Ct Head Wo Contrast  06/24/2012  *RADIOLOGY REPORT*  Clinical Data:  Fall, altered mental status.  Found on floor in nursing home.  CT HEAD WITHOUT CONTRAST CT CERVICAL SPINE WITHOUT CONTRAST  Technique:  Multidetector CT imaging of the head and cervical spine was performed following the standard protocol without intravenous contrast.  Multiplanar CT image reconstructions of the cervical spine were also generated.  Comparison:  04/25/2012.  CT HEAD  Findings: There is no evidence for acute infarction, intracranial hemorrhage, mass lesion, hydrocephalus, or extra-axial fluid. Moderate atrophy with chronic microvascular ischemic change. Calvarium intact.  Clear sinuses and mastoids except for slight air- fluid levels in the left division sphenoid, likely not post traumatic.  Carotid atherosclerosis.  Bilateral cataracts.  Similar appearance to priors.  IMPRESSION: Atrophy and chronic microvascular ischemic change. No visible acute stroke.  No skull fracture or intracranial hemorrhage.  CT CERVICAL SPINE  Findings: There is no visible cervical spine fracture or traumatic subluxation.  1-2 mm facet mediated slip C4-C5.  Severe disc space narrowing C5-6 and C6-7.  No prevertebral soft tissue swelling. Multilevel facet arthropathy.  No pneumothorax.  Airway midline. Bilateral carotid calcification. 1 cm right thyroid cyst.  No odontoid or C1-2 abnormality.  IMPRESSION: Advanced spondylosis. No cervical spine fracture or traumatic subluxation.   Original Report Authenticated By: Davonna Belling, M.D.    Ct Cervical Spine Wo Contrast  06/24/2012  *RADIOLOGY REPORT*  Clinical Data:  Fall, altered mental status.  Found on floor in nursing home.  CT HEAD WITHOUT CONTRAST CT CERVICAL SPINE WITHOUT CONTRAST  Technique:  Multidetector CT imaging of the head and cervical spine was performed following the standard protocol without intravenous contrast.  Multiplanar CT image  reconstructions of the cervical spine were also generated.  Comparison:  04/25/2012.  CT HEAD  Findings: There is no evidence for acute infarction, intracranial hemorrhage, mass lesion, hydrocephalus, or extra-axial fluid. Moderate atrophy with chronic microvascular ischemic change. Calvarium intact.  Clear sinuses and mastoids except for slight air- fluid levels in the left division sphenoid, likely not post traumatic.  Carotid atherosclerosis.  Bilateral cataracts.  Similar appearance to priors.  IMPRESSION: Atrophy and chronic microvascular ischemic change. No visible acute stroke.  No skull fracture or intracranial hemorrhage.  CT CERVICAL SPINE  Findings: There is no visible cervical spine fracture or traumatic subluxation.  1-2 mm facet mediated slip C4-C5.  Severe disc space narrowing C5-6 and C6-7.  No prevertebral soft tissue swelling. Multilevel facet arthropathy.  No pneumothorax.  Airway midline. Bilateral carotid calcification. 1 cm right thyroid cyst.  No odontoid or C1-2 abnormality.  IMPRESSION: Advanced spondylosis. No cervical spine fracture or traumatic subluxation.   Original Report Authenticated By: Davonna Belling, M.D.      1. Fall   2. Muscle strain       MDM  Patient seen and evaluated. Patient immobilized with c-collar. Patient complaining of neck and back pains. No significant signs of trauma. No pain or signs of swelling of extremities. Moves extremities at baseline.  Patient also seen and evaluated by attending physician. Agrees with workup and treatment plan. No significant findings on CT scan and x-rays. At this time  we'll discharge with small prescription for Ultram which patient states has worked for pain and soreness in the past.      Angus Seller, PA 06/25/12 0000

## 2012-06-24 NOTE — ED Notes (Signed)
[id number]> Expected date:<BR> Expected time:<BR> Means of arrival:<BR> Comments:<BR> EMS from Nursing Facility-fall

## 2012-06-24 NOTE — ED Notes (Signed)
[id number]

## 2012-06-25 NOTE — ED Provider Notes (Signed)
Medical screening examination/treatment/procedure(s) were performed by non-physician practitioner and as supervising physician I was immediately available for consultation/collaboration.  Remberto Lienhard T Richie Bonanno, MD 06/25/12 2348 

## 2012-06-25 NOTE — ED Notes (Signed)
Discharge instructions reviewed. Rx given x1.  

## 2012-08-09 ENCOUNTER — Emergency Department (HOSPITAL_COMMUNITY): Payer: Medicare PPO

## 2012-08-09 ENCOUNTER — Encounter (HOSPITAL_COMMUNITY): Payer: Self-pay | Admitting: Emergency Medicine

## 2012-08-09 ENCOUNTER — Emergency Department (HOSPITAL_COMMUNITY)
Admission: EM | Admit: 2012-08-09 | Discharge: 2012-08-09 | Disposition: A | Discharge status: Home / Self Care | Payer: Medicare PPO | Attending: Emergency Medicine | Admitting: Emergency Medicine

## 2012-08-09 ENCOUNTER — Encounter: Payer: Self-pay | Admitting: Neurology

## 2012-08-09 DIAGNOSIS — I252 Old myocardial infarction: Secondary | ICD-10-CM | POA: Insufficient documentation

## 2012-08-09 DIAGNOSIS — Z8781 Personal history of (healed) traumatic fracture: Secondary | ICD-10-CM | POA: Insufficient documentation

## 2012-08-09 DIAGNOSIS — S300XXA Contusion of lower back and pelvis, initial encounter: Secondary | ICD-10-CM | POA: Insufficient documentation

## 2012-08-09 DIAGNOSIS — Z8669 Personal history of other diseases of the nervous system and sense organs: Secondary | ICD-10-CM | POA: Insufficient documentation

## 2012-08-09 DIAGNOSIS — Z951 Presence of aortocoronary bypass graft: Secondary | ICD-10-CM | POA: Insufficient documentation

## 2012-08-09 DIAGNOSIS — F0390 Unspecified dementia without behavioral disturbance: Secondary | ICD-10-CM | POA: Insufficient documentation

## 2012-08-09 DIAGNOSIS — I4891 Unspecified atrial fibrillation: Secondary | ICD-10-CM | POA: Insufficient documentation

## 2012-08-09 DIAGNOSIS — S0993XA Unspecified injury of face, initial encounter: Secondary | ICD-10-CM | POA: Insufficient documentation

## 2012-08-09 DIAGNOSIS — Y9210 Unspecified residential institution as the place of occurrence of the external cause: Secondary | ICD-10-CM | POA: Insufficient documentation

## 2012-08-09 DIAGNOSIS — Z9861 Coronary angioplasty status: Secondary | ICD-10-CM | POA: Insufficient documentation

## 2012-08-09 DIAGNOSIS — Z862 Personal history of diseases of the blood and blood-forming organs and certain disorders involving the immune mechanism: Secondary | ICD-10-CM | POA: Insufficient documentation

## 2012-08-09 DIAGNOSIS — R269 Unspecified abnormalities of gait and mobility: Secondary | ICD-10-CM

## 2012-08-09 DIAGNOSIS — I10 Essential (primary) hypertension: Secondary | ICD-10-CM | POA: Insufficient documentation

## 2012-08-09 DIAGNOSIS — Y939 Activity, unspecified: Secondary | ICD-10-CM | POA: Insufficient documentation

## 2012-08-09 DIAGNOSIS — S79919A Unspecified injury of unspecified hip, initial encounter: Secondary | ICD-10-CM | POA: Insufficient documentation

## 2012-08-09 DIAGNOSIS — W050XXA Fall from non-moving wheelchair, initial encounter: Secondary | ICD-10-CM | POA: Insufficient documentation

## 2012-08-09 DIAGNOSIS — F411 Generalized anxiety disorder: Secondary | ICD-10-CM | POA: Insufficient documentation

## 2012-08-09 DIAGNOSIS — R079 Chest pain, unspecified: Secondary | ICD-10-CM

## 2012-08-09 DIAGNOSIS — E119 Type 2 diabetes mellitus without complications: Secondary | ICD-10-CM | POA: Insufficient documentation

## 2012-08-09 DIAGNOSIS — Z7902 Long term (current) use of antithrombotics/antiplatelets: Secondary | ICD-10-CM | POA: Insufficient documentation

## 2012-08-09 DIAGNOSIS — I2510 Atherosclerotic heart disease of native coronary artery without angina pectoris: Secondary | ICD-10-CM | POA: Insufficient documentation

## 2012-08-09 DIAGNOSIS — Z79899 Other long term (current) drug therapy: Secondary | ICD-10-CM | POA: Insufficient documentation

## 2012-08-09 DIAGNOSIS — S298XXA Other specified injuries of thorax, initial encounter: Secondary | ICD-10-CM | POA: Insufficient documentation

## 2012-08-09 DIAGNOSIS — Z8679 Personal history of other diseases of the circulatory system: Secondary | ICD-10-CM | POA: Insufficient documentation

## 2012-08-09 LAB — BASIC METABOLIC PANEL
BUN: 27 mg/dL — ABNORMAL HIGH (ref 6–23)
Creatinine, Ser: 0.94 mg/dL (ref 0.50–1.10)
GFR calc non Af Amer: 57 mL/min — ABNORMAL LOW (ref >90)
Glucose, Bld: 138 mg/dL — ABNORMAL HIGH (ref 70–99)
Potassium: 4 mEq/L (ref 3.5–5.1)

## 2012-08-09 LAB — URINALYSIS, ROUTINE W REFLEX MICROSCOPIC
Bilirubin Urine: NEGATIVE
Hgb urine dipstick: NEGATIVE
Ketones, ur: NEGATIVE mg/dL
Specific Gravity, Urine: 1.01 (ref 1.005–1.030)
Urobilinogen, UA: 0.2 mg/dL (ref 0.0–1.0)
pH: 6 (ref 5.0–8.0)

## 2012-08-09 LAB — CBC WITH DIFFERENTIAL/PLATELET
Basophils Absolute: 0 10*3/uL (ref 0.0–0.1)
Basophils Relative: 0 % (ref 0–1)
Eosinophils Absolute: 0.2 10*3/uL (ref 0.0–0.7)
HCT: 34.4 % — ABNORMAL LOW (ref 36.0–46.0)
Hemoglobin: 11.6 g/dL — ABNORMAL LOW (ref 12.0–15.0)
Lymphocytes Relative: 73 % — ABNORMAL HIGH (ref 12–46)
Lymphs Abs: 14.1 10*3/uL — ABNORMAL HIGH (ref 0.7–4.0)
MCH: 27.7 pg (ref 26.0–34.0)
MCHC: 33.7 g/dL (ref 30.0–36.0)
MCV: 82.1 fL (ref 78.0–100.0)
Neutro Abs: 4.6 10*3/uL (ref 1.7–7.7)
RDW: 15.6 % — ABNORMAL HIGH (ref 11.5–15.5)

## 2012-08-09 LAB — POCT I-STAT TROPONIN I: Troponin i, poc: 0.01 ng/mL (ref 0.00–0.08)

## 2012-08-09 MED ORDER — ASPIRIN 325 MG PO TABS
325.0000 mg | ORAL_TABLET | Freq: Once | ORAL | Status: AC
  Administered 2012-08-09: 325 mg via ORAL
  Filled 2012-08-09: qty 1

## 2012-08-09 NOTE — ED Notes (Signed)
Patient given copy of discharge paperwork; went over discharge instructions with patient.  Patient instructed to follow up with primary care physician; instructed patient to return to ED for new, worsening, or concerning symptoms.

## 2012-08-09 NOTE — ED Provider Notes (Signed)
History     CSN: [id number]  Arrival date & time 08/09/12  1800   First MD Initiated Contact with Patient 08/09/12 1803      Chief Complaint  Patient presents with  . Fall  . Chest Pain    (Consider location/radiation/quality/duration/timing/severity/associated sxs/prior treatment) Patient is a 79 y.o. female presenting with fall.  Fall The accident occurred less than 1 hour ago. Fall occurred: slid out of wheelchair. She fell from a height of 1 to 2 ft. She landed on a hard floor. Point of impact: buttocks. Pain location: buttocks, BL hips, neck, chest (CP began when lifting off ground) The pain is moderate. She was not ambulatory at the scene. There was no entrapment after the fall. There was no drug use involved in the accident. There was no alcohol use involved in the accident. Pertinent negatives include no visual change, no fever, no numbness, no abdominal pain, no nausea, no vomiting, no headaches and no loss of consciousness. The symptoms are aggravated by activity and use of the injured limb. She has tried nothing for the symptoms. The treatment provided no relief.    Past Medical History  Diagnosis Date  . Hypertension   . Atrial fibrillation   . Parkinson's disease   . Myocardial infarction   . Coronary artery disease   . Hyperlipidemia   . Dementia   . Diabetes mellitus   . Anxiety   . MVP (mitral valve prolapse)     History of  . Unilateral carotid artery stenosis   . Wrist fracture, right   . Peripheral neuropathy   . Lower extremity numbness   . Mitral valve regurgitation   . Leukocytosis   . Dementia   . Gait disorder     Past Surgical History  Procedure Laterality Date  . Inner ear surgery  1985    with metal placement-no mri  . Cesarean section  1964  . Wrist reconstruction  1991  . Anal fistulotomy  08/12/2000  . Coronary artery bypass graft    . Cardiac catheterization    . Sternal wire removal  10/29/2003    I&D of 2 upper sternal wires  .  Coronary angioplasty with stent placement      Family History  Problem Relation Age of Onset  . Heart disease Mother   . Other Father 8    Bowel Obstruction  . Diabetes Maternal Grandmother     History  Substance Use Topics  . Smoking status: Never Smoker   . Smokeless tobacco: Never Used  . Alcohol Use: No    OB History   Grav Para Term Preterm Abortions TAB SAB Ect Mult Living                  Review of Systems  Constitutional: Negative for fever, chills, diaphoresis, activity change, appetite change and fatigue.  HENT: Negative for congestion, sore throat, facial swelling, rhinorrhea, neck pain and neck stiffness.   Eyes: Negative for photophobia and discharge.  Respiratory: Negative for cough, chest tightness and shortness of breath.   Cardiovascular: Negative for chest pain, palpitations and leg swelling.  Gastrointestinal: Negative for nausea, vomiting, abdominal pain and diarrhea.  Endocrine: Negative for polydipsia and polyuria.  Genitourinary: Negative for dysuria, frequency, difficulty urinating and pelvic pain.  Musculoskeletal: Negative for back pain and arthralgias.  Skin: Negative for color change and wound.  Allergic/Immunologic: Negative for immunocompromised state.  Neurological: Negative for loss of consciousness, facial asymmetry, weakness, numbness and headaches.  Hematological: Does  not bruise/bleed easily.  Psychiatric/Behavioral: Negative for confusion and agitation.    Allergies  Zoloft and Latex  Home Medications   Current Outpatient Rx  Name  Route  Sig  Dispense  Refill  . busPIRone (BUSPAR) 10 MG tablet   Oral   Take 1 tablet (10 mg total) by mouth 3 (three) times daily.   90 tablet   0   . carbidopa-levodopa (SINEMET) 25-100 MG per tablet   Oral   Take 1 tablet by mouth 4 (four) times daily. 8 am, 12 pm, 4 pm, 9 pm         . carvedilol (COREG) 3.125 MG tablet   Oral   Take 3.125 mg by mouth 2 (two) times daily with a meal.          . cholecalciferol (VITAMIN D) 1000 UNITS tablet   Oral   Take 1,000 Units by mouth daily.          . clopidogrel (PLAVIX) 75 MG tablet   Oral   Take 75 mg by mouth daily.           . Cranberry 425 MG CAPS   Oral   Take 1 capsule by mouth daily.         . digoxin (LANOXIN) 0.125 MG tablet   Oral   Take 125 mcg by mouth daily.           . furosemide (LASIX) 40 MG tablet   Oral   Take 40 mg by mouth daily.         . polyethylene glycol (MIRALAX / GLYCOLAX) packet   Oral   Take 17 g by mouth daily.           . potassium chloride SA (K-DUR,KLOR-CON) 20 MEQ tablet   Oral   Take 20 mEq by mouth daily.          . QUEtiapine (SEROQUEL) 100 MG tablet   Oral   Take 100 mg by mouth at bedtime.         Marland Kitchen QUEtiapine (SEROQUEL) 25 MG tablet   Oral   Take 25 mg by mouth at bedtime.         . sitaGLIPtin (JANUVIA) 50 MG tablet   Oral   Take 50 mg by mouth daily.           Marland Kitchen ALPRAZolam (XANAX) 0.25 MG tablet   Oral   Take 1 tablet (0.25 mg total) by mouth at bedtime.   30 tablet   0   . docusate sodium (COLACE) 100 MG capsule   Oral   Take 100 mg by mouth 2 (two) times daily as needed. For constipation         . nitroGLYCERIN (NITROSTAT) 0.4 MG SL tablet   Sublingual   Place 0.4 mg under the tongue every 5 (five) minutes as needed. For chest pain         . traMADol (ULTRAM) 50 MG tablet   Oral   Take 1 tablet (50 mg total) by mouth every 6 (six) hours as needed for pain.   10 tablet   0     BP 162/81  Pulse 72  Temp(Src) 98.7 F (37.1 C) (Oral)  Resp 16  Ht 5\' 3"  (1.6 m)  Wt 100 lb (45.36 kg)  BMI 17.72 kg/m2  SpO2 96%  Physical Exam  Constitutional: She is oriented to person, place, and time. She appears well-developed and well-nourished. No distress.  HENT:  Head: Normocephalic and atraumatic.  Mouth/Throat: No oropharyngeal exudate.  Eyes: Pupils are equal, round, and reactive to light.  Neck: Normal range of motion. Neck  supple. Spinous process tenderness present.  Cardiovascular: Normal rate, regular rhythm and normal heart sounds.  Exam reveals no gallop and no friction rub.   No murmur heard. Pulmonary/Chest: Effort normal and breath sounds normal. No respiratory distress. She has no wheezes. She has no rales.    Abdominal: Soft. Bowel sounds are normal. She exhibits no distension and no mass. There is no tenderness. There is no rebound and no guarding.  Musculoskeletal: Normal range of motion. She exhibits no edema and no tenderness.       Back:       Legs: Neurological: She is alert and oriented to person, place, and time.  Skin: Skin is warm and dry.  Psychiatric: She has a normal mood and affect.    ED Course  Procedures (including critical care time)  Labs Reviewed  CBC WITH DIFFERENTIAL - Abnormal; Notable for the following:    WBC 19.3 (*)    Hemoglobin 11.6 (*)    HCT 34.4 (*)    RDW 15.6 (*)    Neutrophils Relative 24 (*)    Lymphocytes Relative 73 (*)    Monocytes Relative 2 (*)    Lymphs Abs 14.1 (*)    All other components within normal limits  BASIC METABOLIC PANEL - Abnormal; Notable for the following:    Glucose, Bld 138 (*)    BUN 27 (*)    GFR calc non Af Amer 57 (*)    GFR calc Af Amer 66 (*)    All other components within normal limits  URINALYSIS, ROUTINE W REFLEX MICROSCOPIC - Abnormal; Notable for the following:    Glucose, UA 100 (*)    All other components within normal limits  URINE CULTURE  POCT I-STAT TROPONIN I   Ct Cervical Spine Wo Contrast  08/09/2012  *RADIOLOGY REPORT*  Clinical Data: History of fall with neck pain.  CT CERVICAL SPINE WITHOUT CONTRAST  Technique:  Multidetector CT imaging of the cervical spine was performed. Multiplanar CT image reconstructions were also generated.  Comparison: CT of the cervical spine 06/24/2012.  Findings: No acute displaced fractures of the cervical spine. There is severe multilevel degenerative disc disease, most  pronounced at C4-C5, C5-C6 and C6-C7.  At C4-C5 there is 2 mm of anterolisthesis of C4 upon C5 (similar to the prior examination). Alignment is otherwise anatomic.  Prevertebral soft tissues are normal.  Multilevel facet arthropathy is also noted.  Incomplete posterior ring of C1 (normal anatomical variant) incidentally noted.  Visualized portions of the upper thorax are unremarkable.  IMPRESSION: 1.  No evidence of significant acute traumatic injury to the cervical spine. 2.  Multilevel degenerative disc disease and cervical spondylosis redemonstrated, as above.  Overall, the appearance is very similar to the recent prior examination.   Original Report Authenticated By: Trudie Reed, M.D.    Dg Pelvis Portable  08/09/2012  *RADIOLOGY REPORT*  Clinical Data: Fall.  Left hip pain.  PORTABLE PELVIS  Comparison: None.  Findings: No pelvic fractures seen.  There is a slightly irregular appearance of the lateral aspect of the femoral neck.  Femoral neck fracture is not excluded.  Consider left hip radiographs.  IMPRESSION: No visible pelvic fracture.  Cannot rule out abnormal femoral neck on the left.  If there is concern about a left hip fracture, left hip radiographs would be suggested.   Original Report Authenticated By:  Paulina Fusi, M.D.    Dg Chest Port 1 View  08/09/2012  *RADIOLOGY REPORT*  Clinical Data: Intermittent chest pain.  History of fall.  PORTABLE CHEST - 1 VIEW  Comparison: Chest x-ray 02/09/2012.  Findings: Ill-defined opacity in the periphery of the left base is similar to remote prior examination, likely represent an area of chronic scarring.  No consolidative airspace disease or definite pleural effusions.  Pulmonary vasculature is within normal limits. Heart size is mildly enlarged.  Mediastinal contours are unremarkable.  Atherosclerosis in the thoracic aorta.  Status post median sternotomy for CABG.  IMPRESSION: 1.  No radiographic evidence of acute cardiopulmonary disease. 2.  Chronic  left basilar scarring. 3.  Atherosclerosis. 4.  Mild cardiomegaly.   Original Report Authenticated By: Trudie Reed, M.D.      1. Fall   2. Sacral contusion   3. Chest pain       MDM  Pt is a 79 y.o. female with pertinent PMHX of HTN, a fib, parkinson's DM, MI HLD, dementia, MVP who presents with complaint of sacral and chest pain after sliding out of wheelchair just PTA.  Per pt, she was reaching for something and slid out of chair onto floor landing on her bottom.  Per report, when she was lifted off ground, began complaining of chest pain.  She denies fever, cough, SOB, n/v, d/a.  Pt is inconsistent when asked whether or not she currently has chest pain (sometimes aswering yes, no, left or right), but does report CP on palpation.   On PE, she has small skin tear R elbow w/ medial and lateral tenderness, ttp BL hips, stage I sacral ulcer and ttp of entire spine, but worse on neck & sacrum.  Denies headache, no focal neuro findings, alert but not oriented. Have ordered CT c spine, XR pelvis, XR chest.  XR pelvis w/ possible femoral head abnormality. L hip film ordered.Imaging otherwise unremarkable for acute changes.   9:11 Pm Have spoken to pt's daughter/HCPOA.  Pt refusing dedicated L hip film, no longer has pain on exam, will d/c.  Have also spoken to daughter about pt's chest pain.  She states that even if she was having a heart attack, they had already discussed that they would not want any intervention.  I explained that I would usually get at least 4hr delta troponin given timing of CP.  Daughter declines repeat troponin which I think is appropriate given no intervention would be taken.  Will plan on D/C back to facility.    1. Fall   2. Sacral contusion   3. Chest pain     Labs and imaging considered in decision making, reviewed by myself.  Imaging interpreted by radiology. Pt care discussed with my attending, Dr. Rosalia Hammers.    Toy Cookey, MD 08/10/12 [phone]

## 2012-08-09 NOTE — ED Notes (Signed)
MD at bedside. 

## 2012-08-09 NOTE — ED Notes (Signed)
Per report from [facility] pt is a resident [facility] [place] with a cc of pt slid out of the wheelchair onto her buttocks, post fall pt stated that she had generalized chest pain.  No distress noted.  Denies nausea or vomiting.  Denies CP at the present.  States that her buttocks hurt at 5/10.

## 2012-08-10 NOTE — ED Provider Notes (Signed)
Elderly female who fell from wheel chair with pain in chest now.  nsst changes noted on ekg.  Patient with dementia and not able to give accurate history.  Family does not wish further work up for chest pain.  I saw and evaluated the patient, reviewed the resident's note and I agree with the findings and plan.   Hilario Quarry, MD 08/10/12 [phone]

## 2012-08-12 LAB — URINE CULTURE: Culture: NO GROWTH

## 2012-09-22 ENCOUNTER — Telehealth: Payer: Self-pay | Admitting: *Deleted

## 2012-09-22 NOTE — Telephone Encounter (Signed)
Patient's daughter called stating she needs a letter stating her mother conditions because she's trying to get guardianship.

## 2012-09-22 NOTE — Telephone Encounter (Signed)
I called the daughter. The patient is having ongoing progression of her memory associated with Parkinson's disease. The patient is in a memory disorders unit. The daughter is trying to get guardianship. I will dictate a letter regarding her dementia.

## 2012-09-23 ENCOUNTER — Encounter: Payer: Self-pay | Admitting: Neurology

## 2012-09-23 ENCOUNTER — Ambulatory Visit (INDEPENDENT_AMBULATORY_CARE_PROVIDER_SITE_OTHER): Payer: Medicare PPO | Admitting: Neurology

## 2012-09-23 VITALS — BP 127/47 | HR 60

## 2012-09-23 DIAGNOSIS — G20 Parkinson's disease: Secondary | ICD-10-CM

## 2012-09-23 DIAGNOSIS — F0390 Unspecified dementia without behavioral disturbance: Secondary | ICD-10-CM

## 2012-09-23 DIAGNOSIS — G20A1 Parkinson's disease without dyskinesia, without mention of fluctuations: Secondary | ICD-10-CM

## 2012-09-23 DIAGNOSIS — R269 Unspecified abnormalities of gait and mobility: Secondary | ICD-10-CM

## 2012-09-23 MED ORDER — MEMANTINE HCL ER 28 MG PO CP24: 28.0000 mg | ORAL_CAPSULE | Freq: Every day | ORAL | Status: DC

## 2012-09-23 NOTE — Progress Notes (Signed)
Reason for visit: Parkinson's disease  April Baker is an 79 y.o. female  History of present illness:  April Baker is a 79 year old right-handed white female with a history of Parkinson's disease and dementia. The patient is currently living in a memory disorders unit at an extended care facility. The patient is having ongoing problems with falling. The patient will fall out of a chair or out of bed. The patient is essentially nonambulatory, and she tends to lean backwards with standing. The patient does not have the cognitive capabilities to understand that she cannot walk. The patient is having some issues with swallowing, and occasionally choking. The patient is having increased problems with anxiety and paranoia, and she will try to lock herself in the room to keep people out. The patient is on Seroquel taking 100 mg in the evening, 50 mg in the morning. The patient has significant sundowning issues. The patient is on BuSpar for anxiety. The patient returns to this office for an evaluation. The daughter is trying to get guardianship over her mother. The patient is on Sinemet taking the 25/100 mg tablets 4 times daily.  Past Medical History  Diagnosis Date  . Hypertension   . Atrial fibrillation   . Parkinson's disease   . Myocardial infarction   . Coronary artery disease   . Hyperlipidemia   . Dementia   . Diabetes mellitus   . Anxiety   . MVP (mitral valve prolapse)     History of  . Unilateral carotid artery stenosis   . Wrist fracture, right   . Peripheral neuropathy   . Lower extremity numbness   . Mitral valve regurgitation   . Leukocytosis   . Dementia   . Gait disorder     Past Surgical History  Procedure Laterality Date  . Inner ear surgery  1985    with metal placement-no mri  . Cesarean section  1964  . Wrist reconstruction  1991  . Anal fistulotomy  08/12/2000  . Coronary artery bypass graft    . Cardiac catheterization    . Sternal wire removal  10/29/2003     I&D of 2 upper sternal wires  . Coronary angioplasty with stent placement      Family History  Problem Relation Age of Onset  . Heart disease Mother   . Other Father 24    Bowel Obstruction  . Diabetes Maternal Grandmother     Social history:  reports that she has never smoked. She has never used smokeless tobacco. She reports that she does not drink alcohol or use illicit drugs.  Allergies:  Allergies  Allergen Reactions  . Zoloft (Sertraline Hcl)   . Latex Itching    Medications:  Current Outpatient Prescriptions on File Prior to Visit  Medication Sig Dispense Refill  . busPIRone (BUSPAR) 10 MG tablet Take 1 tablet (10 mg total) by mouth 3 (three) times daily.  90 tablet  0  . carbidopa-levodopa (SINEMET) 25-100 MG per tablet Take 1 tablet by mouth 3 (three) times daily. 8 am, 12 pm, 4 pm, 9 pm      . carvedilol (COREG) 3.125 MG tablet Take 3.125 mg by mouth 2 (two) times daily with a meal.      . cholecalciferol (VITAMIN D) 1000 UNITS tablet Take 1,000 Units by mouth daily.       . clopidogrel (PLAVIX) 75 MG tablet Take 75 mg by mouth daily.        . Cranberry 425 MG CAPS Take  1 capsule by mouth daily.      . digoxin (LANOXIN) 0.125 MG tablet Take 125 mcg by mouth daily.        Marland Kitchen docusate sodium (COLACE) 100 MG capsule Take 100 mg by mouth 2 (two) times daily as needed. For constipation      . furosemide (LASIX) 40 MG tablet Take 40 mg by mouth daily.      . nitroGLYCERIN (NITROSTAT) 0.4 MG SL tablet Place 0.4 mg under the tongue every 5 (five) minutes as needed. For chest pain      . polyethylene glycol (MIRALAX / GLYCOLAX) packet Take 17 g by mouth daily.        . potassium chloride SA (K-DUR,KLOR-CON) 20 MEQ tablet Take 20 mEq by mouth daily.       . QUEtiapine (SEROQUEL) 100 MG tablet Take 100 mg by mouth at bedtime.      . sitaGLIPtin (JANUVIA) 50 MG tablet Take 50 mg by mouth daily.        . traMADol (ULTRAM) 50 MG tablet Take 1 tablet (50 mg total) by mouth every 6  (six) hours as needed for pain.  10 tablet  0   No current facility-administered medications on file prior to visit.    ROS:  Out of a complete 14 system review of symptoms, the patient complains only of the following symptoms, and all other reviewed systems are negative.  Fatigue Swelling in the legs Difficulty swallowing Snoring Constipation Joint pain, achy muscles Memory loss, confusion, weakness, slurred speech, dizziness Depression, anxiety, drowsiness, disinterest in activities, suicidal thoughts, hallucinations  Blood pressure 127/47, pulse 60.  Physical Exam  General: The patient is alert and cooperative at the time of the examination.  Skin: 2+ edema at the ankles is noted bilaterally.   Neurologic Exam  Cranial nerves: Facial symmetry is present. Speech is normal, no aphasia or dysarthria is noted. Extraocular movements are full. Visual fields are full. The patient has significant eye opening apraxia.  Motor: The patient has good strength in all 4 extremities.  Coordination: The patient has good finger-nose-finger and heel-to-shin bilaterally. There is surprisingly little apraxia with the use of the extremities.  Gait and station: The patient requires assistance with standing. Once up, the patient is unable to walk without assistance. The patient tends to lean backwards. Romberg is positive. No drift is seen.  Reflexes: Deep tendon reflexes are symmetric.   Assessment/Plan:  One. Parkinson's disease  2. Gait disorder  3. Dementia  The patient is having significant cognitive issues at this point. The patient is functionally nonambulatory. At this point, the Sinemet will be reduced taking the 25/100 mg tablets 3 times daily. The patient will be placed on Namenda to help with the memory issue and with some of the agitation. The patient will followup in 3-4 months.  Marlan Palau MD 09/23/2012 7:49 PM  [facility] [address]  [address] [place], [place] [address]  Phone [phone] Fax [phone]

## 2012-11-19 ENCOUNTER — Other Ambulatory Visit: Payer: Self-pay

## 2012-11-19 MED ORDER — CARBIDOPA-LEVODOPA 25-100 MG PO TABS: 1.0000 | ORAL_TABLET | Freq: Four times a day (QID) | ORAL | Status: DC

## 2012-11-21 IMAGING — CR DG CHEST 2V
2 series · 2 of 2 positions shown · non-contrast
Comparison: 12/26/2010

CLINICAL DATA: Chest pain

CHEST - 2 VIEW

[w chest lat]
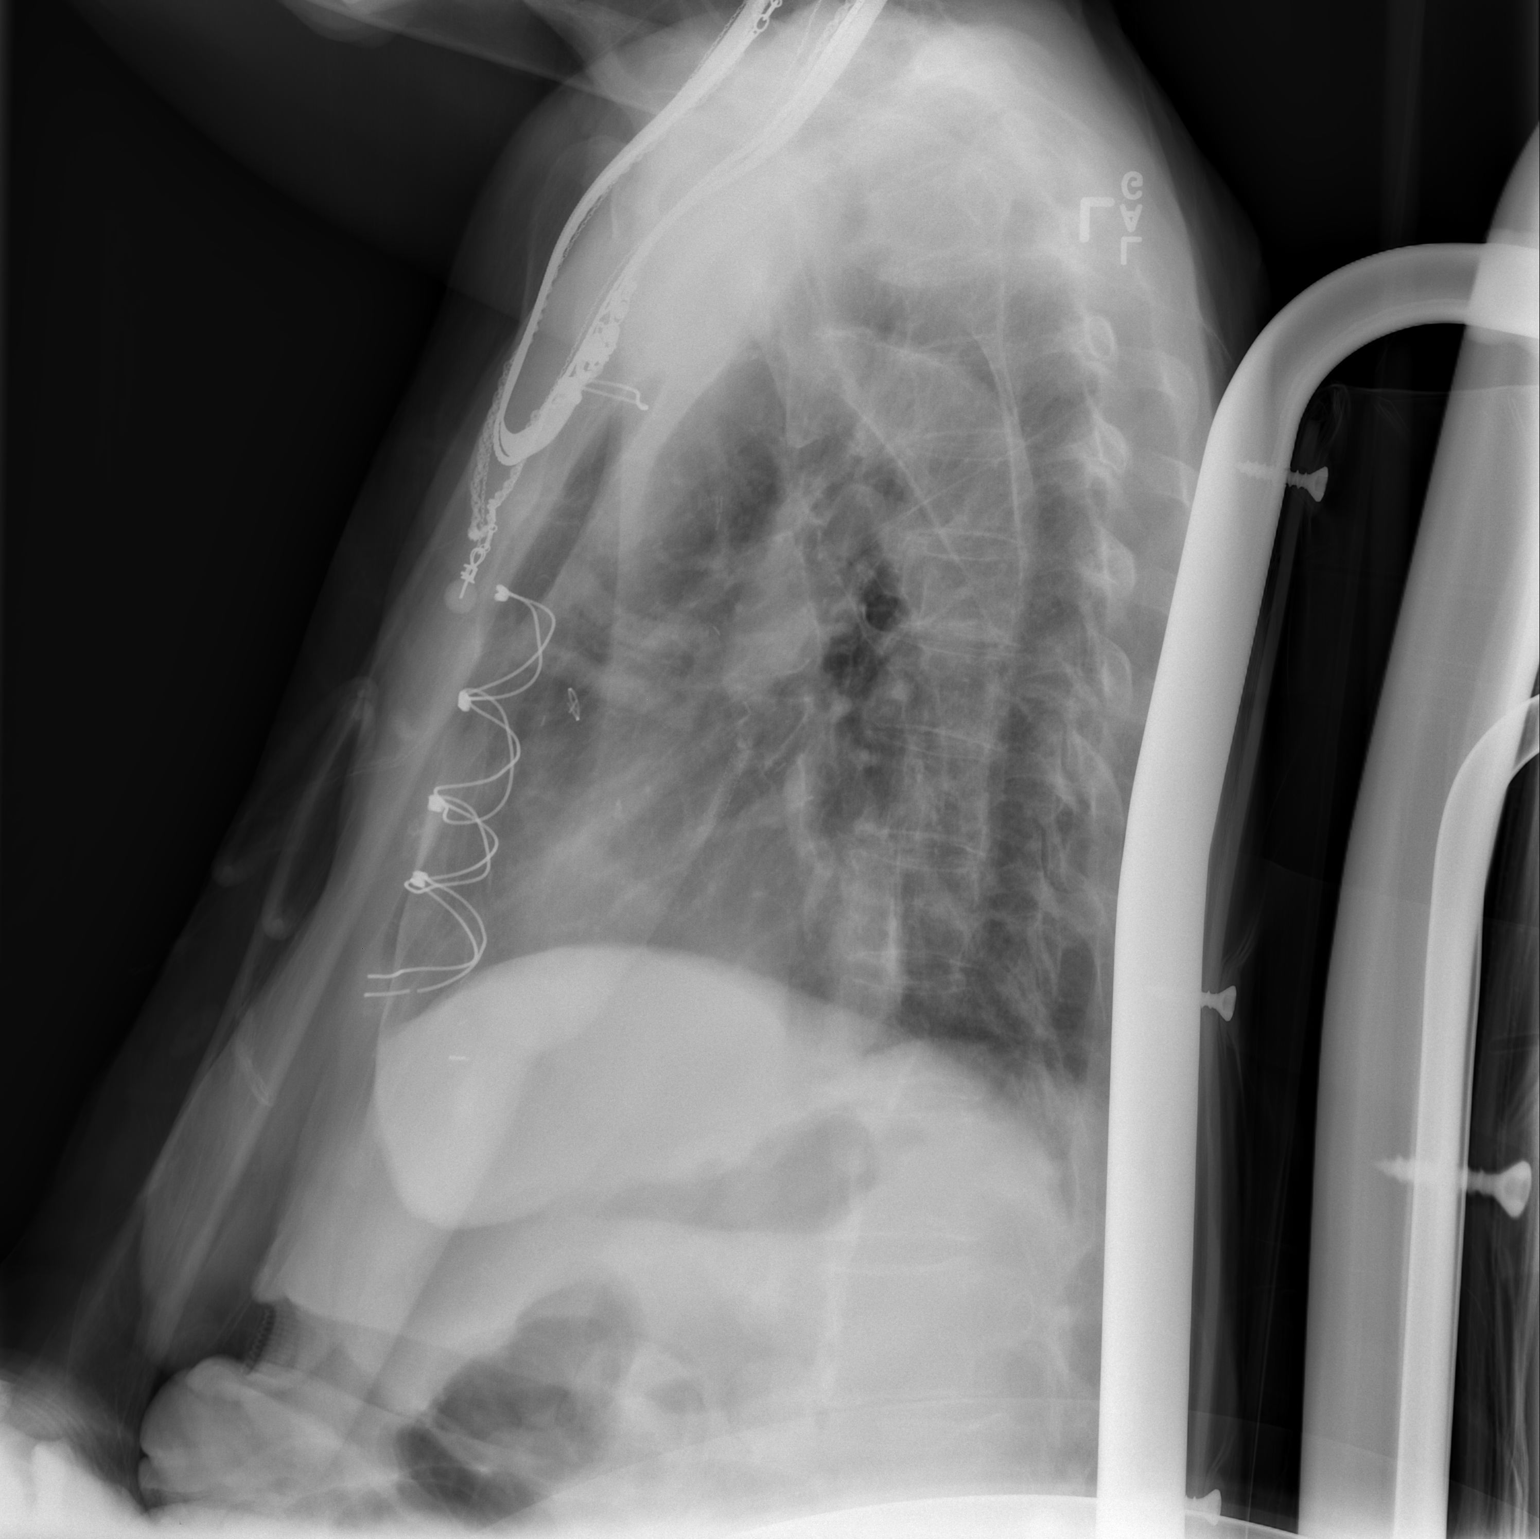

[view not recorded]
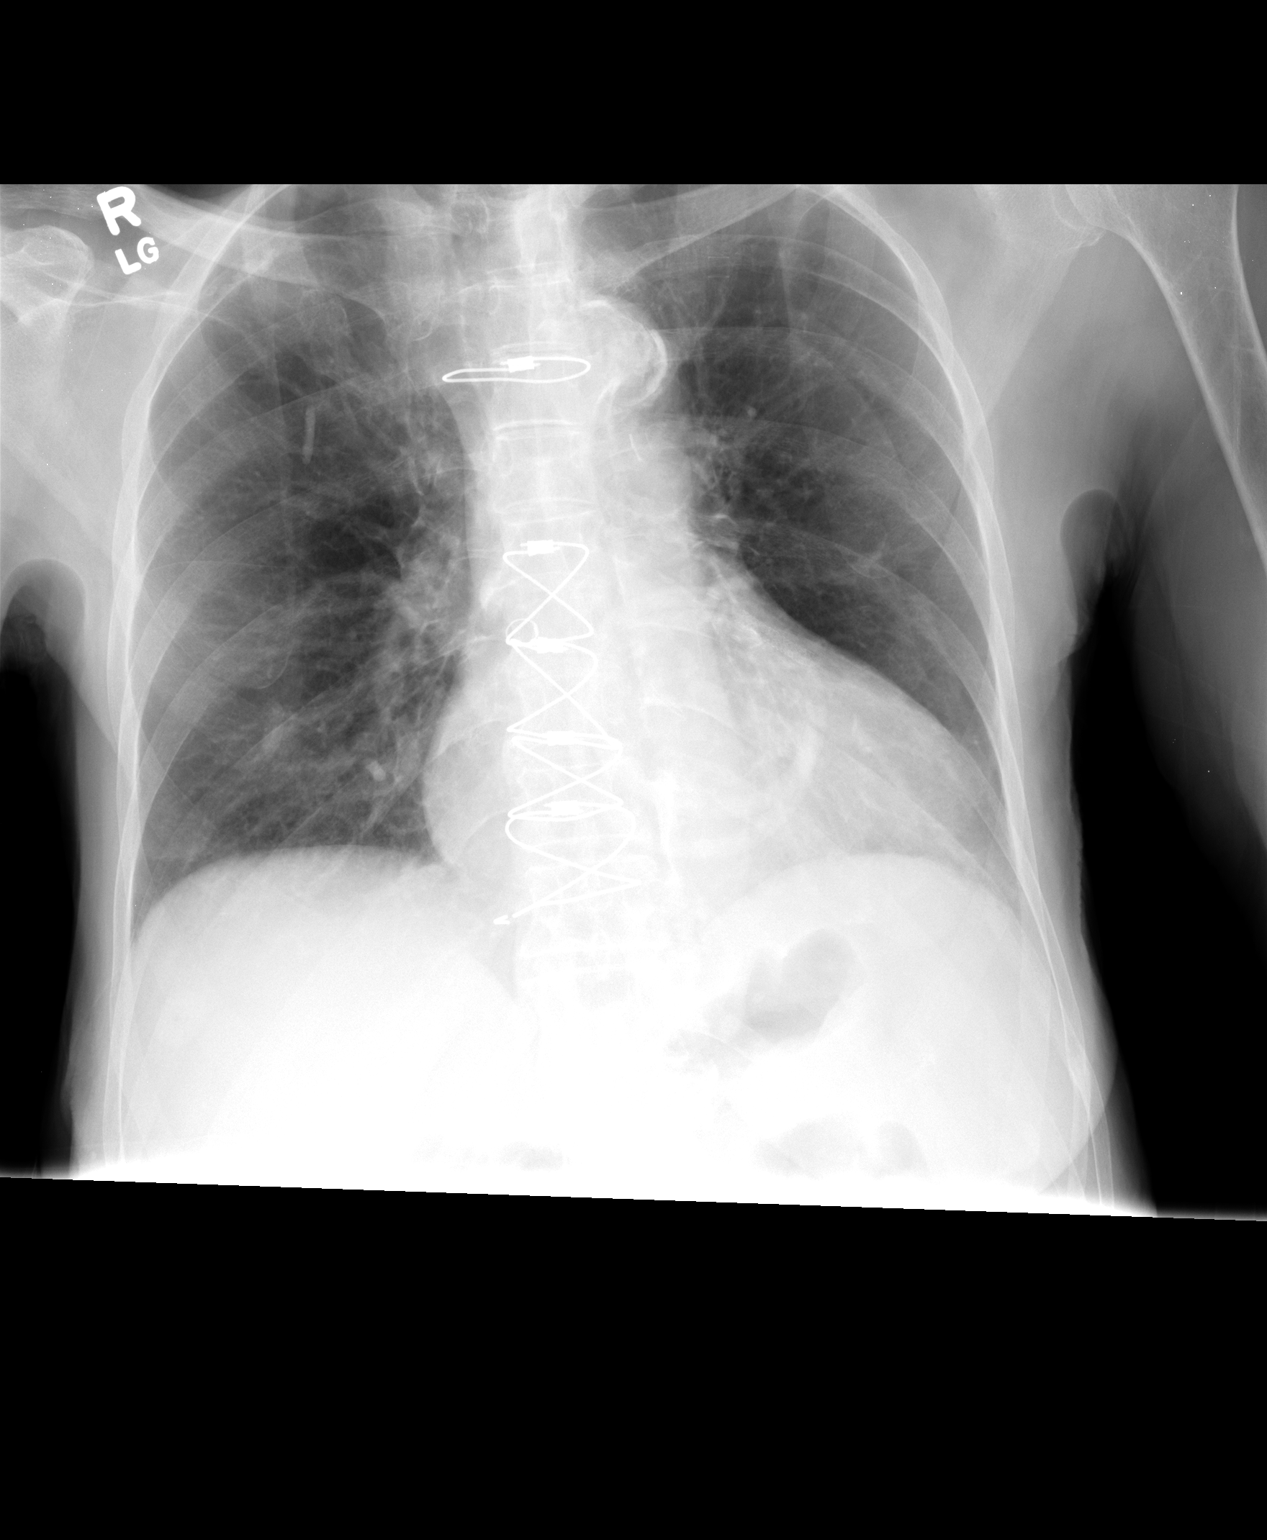

[2 of 2 positions shown; findings below may reference images not displayed]

FINDINGS: Status post median sternotomy.  Mild cardiomegaly.
Aortic atherosclerotic calcification.  No pneumothorax.  No focal
consolidation.  Osteopenia.
IMPRESSION: Mild cardiomegaly status post median sternotomy.  No focal
consolidation.

## 2013-02-01 ENCOUNTER — Emergency Department (HOSPITAL_COMMUNITY)
Admission: EM | Admit: 2013-02-01 | Discharge: 2013-02-01 | Disposition: A | Discharge status: Skilled Nursing Facility | Payer: Medicare PPO | Attending: Emergency Medicine | Admitting: Emergency Medicine

## 2013-02-01 ENCOUNTER — Emergency Department (HOSPITAL_COMMUNITY): Payer: Medicare PPO

## 2013-02-01 ENCOUNTER — Encounter (HOSPITAL_COMMUNITY): Payer: Self-pay | Admitting: Emergency Medicine

## 2013-02-01 DIAGNOSIS — I4891 Unspecified atrial fibrillation: Secondary | ICD-10-CM | POA: Insufficient documentation

## 2013-02-01 DIAGNOSIS — S46909A Unspecified injury of unspecified muscle, fascia and tendon at shoulder and upper arm level, unspecified arm, initial encounter: Secondary | ICD-10-CM | POA: Insufficient documentation

## 2013-02-01 DIAGNOSIS — W19XXXA Unspecified fall, initial encounter: Secondary | ICD-10-CM

## 2013-02-01 DIAGNOSIS — F0280 Dementia in other diseases classified elsewhere without behavioral disturbance: Secondary | ICD-10-CM | POA: Insufficient documentation

## 2013-02-01 DIAGNOSIS — Z862 Personal history of diseases of the blood and blood-forming organs and certain disorders involving the immune mechanism: Secondary | ICD-10-CM | POA: Insufficient documentation

## 2013-02-01 DIAGNOSIS — Y9389 Activity, other specified: Secondary | ICD-10-CM | POA: Insufficient documentation

## 2013-02-01 DIAGNOSIS — I252 Old myocardial infarction: Secondary | ICD-10-CM | POA: Insufficient documentation

## 2013-02-01 DIAGNOSIS — Y9210 Unspecified residential institution as the place of occurrence of the external cause: Secondary | ICD-10-CM | POA: Insufficient documentation

## 2013-02-01 DIAGNOSIS — Z951 Presence of aortocoronary bypass graft: Secondary | ICD-10-CM | POA: Insufficient documentation

## 2013-02-01 DIAGNOSIS — Y92129 Unspecified place in nursing home as the place of occurrence of the external cause: Secondary | ICD-10-CM

## 2013-02-01 DIAGNOSIS — Z91040 Latex allergy status: Secondary | ICD-10-CM | POA: Insufficient documentation

## 2013-02-01 DIAGNOSIS — S59909A Unspecified injury of unspecified elbow, initial encounter: Secondary | ICD-10-CM | POA: Insufficient documentation

## 2013-02-01 DIAGNOSIS — E785 Hyperlipidemia, unspecified: Secondary | ICD-10-CM | POA: Insufficient documentation

## 2013-02-01 DIAGNOSIS — Z79899 Other long term (current) drug therapy: Secondary | ICD-10-CM | POA: Insufficient documentation

## 2013-02-01 DIAGNOSIS — Z872 Personal history of diseases of the skin and subcutaneous tissue: Secondary | ICD-10-CM | POA: Insufficient documentation

## 2013-02-01 DIAGNOSIS — Z8669 Personal history of other diseases of the nervous system and sense organs: Secondary | ICD-10-CM | POA: Insufficient documentation

## 2013-02-01 DIAGNOSIS — I10 Essential (primary) hypertension: Secondary | ICD-10-CM | POA: Insufficient documentation

## 2013-02-01 DIAGNOSIS — E119 Type 2 diabetes mellitus without complications: Secondary | ICD-10-CM | POA: Insufficient documentation

## 2013-02-01 DIAGNOSIS — S4980XA Other specified injuries of shoulder and upper arm, unspecified arm, initial encounter: Secondary | ICD-10-CM | POA: Insufficient documentation

## 2013-02-01 DIAGNOSIS — S6990XA Unspecified injury of unspecified wrist, hand and finger(s), initial encounter: Secondary | ICD-10-CM | POA: Insufficient documentation

## 2013-02-01 DIAGNOSIS — Z8781 Personal history of (healed) traumatic fracture: Secondary | ICD-10-CM | POA: Insufficient documentation

## 2013-02-01 DIAGNOSIS — Z8679 Personal history of other diseases of the circulatory system: Secondary | ICD-10-CM | POA: Insufficient documentation

## 2013-02-01 DIAGNOSIS — G20A1 Parkinson's disease without dyskinesia, without mention of fluctuations: Secondary | ICD-10-CM | POA: Insufficient documentation

## 2013-02-01 DIAGNOSIS — W010XXA Fall on same level from slipping, tripping and stumbling without subsequent striking against object, initial encounter: Secondary | ICD-10-CM | POA: Insufficient documentation

## 2013-02-01 DIAGNOSIS — I2510 Atherosclerotic heart disease of native coronary artery without angina pectoris: Secondary | ICD-10-CM | POA: Insufficient documentation

## 2013-02-01 DIAGNOSIS — G20 Parkinson's disease: Secondary | ICD-10-CM | POA: Insufficient documentation

## 2013-02-01 DIAGNOSIS — Z7902 Long term (current) use of antithrombotics/antiplatelets: Secondary | ICD-10-CM | POA: Insufficient documentation

## 2013-02-01 NOTE — ED Provider Notes (Signed)
CSN: [id number]     Arrival date & time 02/01/13  1257 History     First MD Initiated Contact with Patient 02/01/13 1330     Chief Complaint  Patient presents with  . Fall   (Consider location/radiation/quality/duration/timing/severity/associated sxs/prior Treatment) The history is provided by the patient.  April Baker is a 79 y.o. female hx of HTN, afib not on coumadin, dementia here with fall. Resides at [facility] and she said she tripped over a threshold in the bathroom and landed on her right side. Denies head injury or neck injury. Denies any headache or back pain but does have some right arm and shoulder pain.   Level V caveat- dementia    Past Medical History  Diagnosis Date  . Hypertension   . Atrial fibrillation   . Parkinson's disease   . Myocardial infarction   . Coronary artery disease   . Hyperlipidemia   . Dementia   . Diabetes mellitus   . Anxiety   . MVP (mitral valve prolapse)     History of  . Unilateral carotid artery stenosis   . Wrist fracture, right   . Peripheral neuropathy   . Lower extremity numbness   . Mitral valve regurgitation   . Leukocytosis   . Dementia   . Gait disorder    Past Surgical History  Procedure Laterality Date  . Inner ear surgery  1985    with metal placement-no mri  . Cesarean section  1964  . Wrist reconstruction  1991  . Anal fistulotomy  08/12/2000  . Coronary artery bypass graft    . Cardiac catheterization    . Sternal wire removal  10/29/2003    I&D of 2 upper sternal wires  . Coronary angioplasty with stent placement     Family History  Problem Relation Age of Onset  . Heart disease Mother   . Other Father 83    Bowel Obstruction  . Diabetes Maternal Grandmother    History  Substance Use Topics  . Smoking status: Never Smoker   . Smokeless tobacco: Never Used  . Alcohol Use: No   OB History   Grav Para Term Preterm Abortions TAB SAB Ect Mult Living                 Review of Systems   Musculoskeletal:       R shoulder and arm pain   All other systems reviewed and are negative.    Allergies  Zoloft and Latex  Home Medications   Current Outpatient Rx  Name  Route  Sig  Dispense  Refill  . busPIRone (BUSPAR) 10 MG tablet   Oral   Take 10 mg by mouth 3 (three) times daily.         . carbidopa-levodopa (SINEMET IR) 25-100 MG per tablet   Oral   Take 1 tablet by mouth 4 (four) times daily.         . carvedilol (COREG) 3.125 MG tablet   Oral   Take 3.125 mg by mouth 2 (two) times daily with a meal.         . cholecalciferol (VITAMIN D) 1000 UNITS tablet   Oral   Take 1,000 Units by mouth daily.          . clopidogrel (PLAVIX) 75 MG tablet   Oral   Take 75 mg by mouth daily.           . Cranberry 425 MG CAPS   Oral  Take 1 capsule by mouth daily.         . digoxin (LANOXIN) 0.125 MG tablet   Oral   Take 125 mcg by mouth daily.           . furosemide (LASIX) 40 MG tablet   Oral   Take 40 mg by mouth daily.         Marland Kitchen glimepiride (AMARYL) 1 MG tablet   Oral   Take 1 mg by mouth daily before breakfast.         . polyethylene glycol (MIRALAX / GLYCOLAX) packet   Oral   Take 17 g by mouth daily.           . potassium chloride SA (K-DUR,KLOR-CON) 20 MEQ tablet   Oral   Take 20 mEq by mouth daily.          . QUEtiapine (SEROQUEL) 50 MG tablet   Oral   Take 50 mg by mouth 2 (two) times daily.          . sitaGLIPtin (JANUVIA) 50 MG tablet   Oral   Take 50 mg by mouth daily.           . cetirizine (ZYRTEC) 10 MG tablet   Oral   Take 10 mg by mouth daily as needed for allergies.         Marland Kitchen docusate sodium (COLACE) 100 MG capsule   Oral   Take 100 mg by mouth 2 (two) times daily as needed. For constipation         . nitroGLYCERIN (NITROSTAT) 0.4 MG SL tablet   Sublingual   Place 0.4 mg under the tongue every 5 (five) minutes as needed. For chest pain          BP 169/60  Temp(Src) 98.7 F (37.1 C) (Oral)   Resp 16  SpO2 100% Physical Exam  Nursing note and vitals reviewed. Constitutional: She appears well-developed.  Demented, comfortable   HENT:  Head: Normocephalic and atraumatic.  Mouth/Throat: Oropharynx is clear and moist.  No scalp hematoma   Eyes: Conjunctivae are normal. Pupils are equal, round, and reactive to light.  Neck: Normal range of motion. Neck supple.  No midline tenderness   Cardiovascular: Normal rate, regular rhythm and normal heart sounds.   Pulmonary/Chest: Effort normal and breath sounds normal. No respiratory distress. She has no wheezes. She has no rales.  Abdominal: Soft. Bowel sounds are normal. She exhibits no distension. There is no tenderness. There is no rebound.  Musculoskeletal:  R shoulder dec ROM due to pain. Shoulder doesn't appear dislocated. Mild tenderness R elbow but no obvious bruise. 2+ pulses, nl ROM R wrist. Neurovascular exam otherwise unremarkable throughout all extremities. Bilateral hip ROM nl.   Neurological: She is alert.  Demented, nl strength and sensation throughout   Skin: Skin is warm and dry.  Psychiatric: She has a normal mood and affect. Her behavior is normal. Judgment and thought content normal.    ED Course   Procedures (including critical care time)  Labs Reviewed - No data to display Dg Chest 1 View  02/01/2013   *RADIOLOGY REPORT*  Clinical Data: Fall.  Right shoulder pain.  CHEST - 1 VIEW  Comparison: 08/09/2012.  Findings: Prominent skin fold projects over the right chest.  Median sternotomy for CABG.  Cardiopericardial silhouette within normal limits for projection.  Calcification projects over the right apex, probably atherosclerotic.  Coronary artery stent is noted.  No pneumothorax.  No airspace disease.  No  effusion. Monitoring leads are projected over the chest.  Chronic bronchitic changes are present in the retrocardiac region.  IMPRESSION: No acute cardiopulmonary disease.  Postoperative changes CABG. Retrocardiac  scarring and bronchitic change.   Original Report Authenticated By: Andreas Newport, M.D.   Dg Pelvis 1-2 Views  02/01/2013   *RADIOLOGY REPORT*  Clinical Data: Fall.  Bilateral hip pain.  PELVIS - 1-2 VIEW  Comparison: None.  Findings: No displaced femoral fracture or pelvic fractures identified.  The left hip osteoarthritis is present with protuberance along the lateral head neck junction which appears similar to prior.  The exam is under penetrated.  There is bowel gas projected over the left parasymphyseal pubis.  IMPRESSION: No acute abnormality.   Original Report Authenticated By: Andreas Newport, M.D.   Dg Shoulder Right  02/01/2013   *RADIOLOGY REPORT*  Clinical Data: Fall, right shoulder pain, axillary view could not be obtained  RIGHT SHOULDER - 2+ VIEW  Comparison: None.  Findings: No fracture or dislocation.  IMPRESSION: No acute findings   Original Report Authenticated By: Esperanza Heir, M.D.   Dg Elbow Complete Right  02/01/2013   *RADIOLOGY REPORT*  Clinical Data: Fall.  Right elbow pain.  RIGHT ELBOW - COMPLETE 3+ VIEW  Comparison: None.  Findings: Anatomic alignment.  No effusion.  No fracture. Osteopenia.  IMPRESSION: Negative.   Original Report Authenticated By: Andreas Newport, M.D.   No diagnosis found.  MDM  ADITI ROVIRA is a 79 y.o. female here with fall. Xray showed no fracture. Refused pain meds. Stable for d/c.   3:04 PM Xray showed no fractures. Can take tylenol prn pain.   Richardean Canal, MD 02/01/13 [phone]

## 2013-02-01 NOTE — ED Notes (Signed)
[facility] called for transport.  

## 2013-02-01 NOTE — ED Notes (Signed)
Bed: ZO10 Expected date:  Expected time:  Means of arrival: Ambulance Comments: Fall/extremity pain

## 2013-02-01 NOTE — ED Notes (Signed)
Patient from [facility] fell this morning , no LOC.  Patient c/o right upper arm pain.  Bruising but no deformity.  Lateral right hip pain, no shortening or rotation.  Denies neck, head, or back pain.  Patient alert and oriemted x3 but does have a history of dementia.

## 2013-02-01 NOTE — ED Notes (Signed)
Patient unable to rate pain level.

## 2013-02-12 ENCOUNTER — Encounter: Payer: Self-pay | Admitting: Neurology

## 2013-02-12 ENCOUNTER — Encounter (HOSPITAL_COMMUNITY): Payer: Self-pay | Admitting: *Deleted

## 2013-02-12 ENCOUNTER — Emergency Department (HOSPITAL_COMMUNITY)
Admission: EM | Admit: 2013-02-12 | Discharge: 2013-02-13 | Disposition: A | Discharge status: Skilled Nursing Facility | Payer: Medicare PPO | Attending: Emergency Medicine | Admitting: Emergency Medicine

## 2013-02-12 ENCOUNTER — Ambulatory Visit (INDEPENDENT_AMBULATORY_CARE_PROVIDER_SITE_OTHER): Payer: Medicare PPO | Admitting: Neurology

## 2013-02-12 ENCOUNTER — Emergency Department (HOSPITAL_COMMUNITY): Payer: Medicare PPO

## 2013-02-12 DIAGNOSIS — Y9210 Unspecified residential institution as the place of occurrence of the external cause: Secondary | ICD-10-CM | POA: Insufficient documentation

## 2013-02-12 DIAGNOSIS — C9110 Chronic lymphocytic leukemia of B-cell type not having achieved remission: Secondary | ICD-10-CM | POA: Insufficient documentation

## 2013-02-12 DIAGNOSIS — I2510 Atherosclerotic heart disease of native coronary artery without angina pectoris: Secondary | ICD-10-CM | POA: Insufficient documentation

## 2013-02-12 DIAGNOSIS — F0390 Unspecified dementia without behavioral disturbance: Secondary | ICD-10-CM

## 2013-02-12 DIAGNOSIS — Y939 Activity, unspecified: Secondary | ICD-10-CM | POA: Insufficient documentation

## 2013-02-12 DIAGNOSIS — G20 Parkinson's disease: Secondary | ICD-10-CM | POA: Insufficient documentation

## 2013-02-12 DIAGNOSIS — Z7902 Long term (current) use of antithrombotics/antiplatelets: Secondary | ICD-10-CM | POA: Insufficient documentation

## 2013-02-12 DIAGNOSIS — IMO0002 Reserved for concepts with insufficient information to code with codable children: Secondary | ICD-10-CM | POA: Insufficient documentation

## 2013-02-12 DIAGNOSIS — E119 Type 2 diabetes mellitus without complications: Secondary | ICD-10-CM | POA: Insufficient documentation

## 2013-02-12 DIAGNOSIS — R269 Unspecified abnormalities of gait and mobility: Secondary | ICD-10-CM

## 2013-02-12 DIAGNOSIS — Z91040 Latex allergy status: Secondary | ICD-10-CM | POA: Insufficient documentation

## 2013-02-12 DIAGNOSIS — Z9861 Coronary angioplasty status: Secondary | ICD-10-CM | POA: Insufficient documentation

## 2013-02-12 DIAGNOSIS — F0280 Dementia in other diseases classified elsewhere without behavioral disturbance: Secondary | ICD-10-CM | POA: Insufficient documentation

## 2013-02-12 DIAGNOSIS — Z951 Presence of aortocoronary bypass graft: Secondary | ICD-10-CM | POA: Insufficient documentation

## 2013-02-12 DIAGNOSIS — I252 Old myocardial infarction: Secondary | ICD-10-CM | POA: Insufficient documentation

## 2013-02-12 DIAGNOSIS — Z79899 Other long term (current) drug therapy: Secondary | ICD-10-CM | POA: Insufficient documentation

## 2013-02-12 DIAGNOSIS — G20A1 Parkinson's disease without dyskinesia, without mention of fluctuations: Secondary | ICD-10-CM

## 2013-02-12 DIAGNOSIS — W19XXXA Unspecified fall, initial encounter: Secondary | ICD-10-CM

## 2013-02-12 DIAGNOSIS — Z8781 Personal history of (healed) traumatic fracture: Secondary | ICD-10-CM | POA: Insufficient documentation

## 2013-02-12 DIAGNOSIS — F411 Generalized anxiety disorder: Secondary | ICD-10-CM | POA: Insufficient documentation

## 2013-02-12 DIAGNOSIS — W050XXA Fall from non-moving wheelchair, initial encounter: Secondary | ICD-10-CM | POA: Insufficient documentation

## 2013-02-12 DIAGNOSIS — S0990XA Unspecified injury of head, initial encounter: Secondary | ICD-10-CM | POA: Insufficient documentation

## 2013-02-12 DIAGNOSIS — I4891 Unspecified atrial fibrillation: Secondary | ICD-10-CM | POA: Insufficient documentation

## 2013-02-12 DIAGNOSIS — I10 Essential (primary) hypertension: Secondary | ICD-10-CM | POA: Insufficient documentation

## 2013-02-12 NOTE — Progress Notes (Signed)
Reason for visit: Parkinson's disease  April Baker is an 79 y.o. female  History of present illness:  April Baker is a 79 year old right-handed white female with a history of Parkinson's disease and an associated gait disorder and dementia. The patient is residing in an extended care facility, [facility] [place]. The patient continues to try to walk on her own, and she will occasionally fall. The patient was in the emergency room on 02/01/2013 following a fall. The patient is still sundowning, and she is having periods of paranoia and agitation at times. The patient may have rapid swings in her level of agitation and mental status. The patient is having paranoia, trying to lock herself into her room. The door to her room has been removed for this reason. The patient is now on Abilify, and low-dose Seroquel. The patient was to take 3 of the Sinemet tablets daily, but she remains on 4 tablets daily. The patient has had some reduction in appetite. The patient continues to have eye opening apraxia. At times, the patient requires assistance even with eating at times. The patient does have hallucinations at times.   Past Medical History  Diagnosis Date  . Hypertension   . Atrial fibrillation   . Parkinson's disease   . Myocardial infarction   . Coronary artery disease   . Hyperlipidemia   . Dementia   . Diabetes mellitus   . Anxiety   . MVP (mitral valve prolapse)     History of  . Unilateral carotid artery stenosis   . Wrist fracture, right   . Peripheral neuropathy   . Lower extremity numbness   . Mitral valve regurgitation   . Leukocytosis   . Dementia   . Gait disorder   . CLL (chronic lymphocytic leukemia)     Past Surgical History  Procedure Laterality Date  . Inner ear surgery  1985    with metal placement-no mri  . Cesarean section  1964  . Wrist reconstruction  1991  . Anal fistulotomy  08/12/2000  . Coronary artery bypass graft    . Cardiac catheterization     . Sternal wire removal  10/29/2003    I&D of 2 upper sternal wires  . Coronary angioplasty with stent placement      Family History  Problem Relation Age of Onset  . Heart disease Mother   . Other Father 23    Bowel Obstruction  . Diabetes Maternal Grandmother     Social history:  reports that she has never smoked. She has never used smokeless tobacco. She reports that she does not drink alcohol or use illicit drugs.    Allergies  Allergen Reactions  . Zoloft [Sertraline Hcl]   . Latex Itching    Medications:  Current Outpatient Prescriptions on File Prior to Visit  Medication Sig Dispense Refill  . busPIRone (BUSPAR) 10 MG tablet Take 10 mg by mouth 3 (three) times daily.      . carbidopa-levodopa (SINEMET IR) 25-100 MG per tablet Take 1 tablet by mouth 3 (three) times daily.       . carvedilol (COREG) 3.125 MG tablet Take 3.125 mg by mouth 2 (two) times daily with a meal.      . cetirizine (ZYRTEC) 10 MG tablet Take 10 mg by mouth daily as needed for allergies.      . cholecalciferol (VITAMIN D) 1000 UNITS tablet Take 1,000 Units by mouth daily.       . clopidogrel (PLAVIX) 75 MG  tablet Take 75 mg by mouth daily.        . Cranberry 425 MG CAPS Take 1 capsule by mouth daily.      . digoxin (LANOXIN) 0.125 MG tablet Take 125 mcg by mouth daily.        Marland Kitchen docusate sodium (COLACE) 100 MG capsule Take 100 mg by mouth 2 (two) times daily as needed. For constipation      . furosemide (LASIX) 40 MG tablet Take 40 mg by mouth daily.      Marland Kitchen glimepiride (AMARYL) 1 MG tablet Take 1 mg by mouth daily before breakfast.      . nitroGLYCERIN (NITROSTAT) 0.4 MG SL tablet Place 0.4 mg under the tongue every 5 (five) minutes as needed. For chest pain      . polyethylene glycol (MIRALAX / GLYCOLAX) packet Take 17 g by mouth daily.        . potassium chloride SA (K-DUR,KLOR-CON) 20 MEQ tablet Take 20 mEq by mouth daily.       . sitaGLIPtin (JANUVIA) 50 MG tablet Take 50 mg by mouth daily.          No current facility-administered medications on file prior to visit.    ROS:  Out of a complete 14 system review of symptoms, the patient complains only of the following symptoms, and all other reviewed systems are negative.  Fatigue Swelling in the legs Snoring Easy bruising Joint swelling Memory loss, confusion, weakness, slurred speech Anxiety, decreased energy, change in appetite, hallucinations Insomnia, sleepiness  There were no vitals taken for this visit.  Physical Exam  General: The patient is alert and cooperative at the time of the examination.  Skin: No significant peripheral edema is noted.   Neurologic Exam  Mental status: Mini-Mental status examination done today shows a total score of 15/30. The patient is able to name 4 animals in 60 seconds.  Cranial nerves: Facial symmetry is present. Speech is normal, no aphasia or dysarthria is noted. Extraocular movements are full. Visual fields are full. The patient does have difficulty keeping her eyes open at times.  Motor: The patient has good strength in all 4 extremities.  Coordination: The patient has good finger-nose-finger and heel-to-shin bilaterally.  Gait and station: The patient requires assistance with standing. Once up, the patient has a tendency to lean backwards. Romberg is positive, the patient falls backwards. The patient can take a few steps with assistance, with short shuffling steps, difficulty with turns. Tandem gait was not attempted.  Reflexes: Deep tendon reflexes are symmetric.   Assessment/Plan:  One. Parkinson's disease  2. Gait disorder  3. Dementia  The patient will be reduced on the Sinemet taking the 25/100 mg tablets 3 times daily. The patient followup in 4 months.  Marlan Palau MD 02/12/2013 11:47 AM  [facility] [address] [place], [place] [address]  Phone [phone] Fax [phone]

## 2013-02-12 NOTE — ED Notes (Signed)
Pt from [facility] and brought in by EMS; pt was found in floor via staff in front of wheelchair; pt with laceration to back of head; No known LOC; pt c/o soreness to head.

## 2013-02-12 NOTE — ED Notes (Signed)
PA at bedside.

## 2013-02-13 ENCOUNTER — Encounter (HOSPITAL_COMMUNITY): Payer: Self-pay | Admitting: Radiology

## 2013-02-13 NOTE — ED Notes (Signed)
Patient transported to CT 

## 2013-02-13 NOTE — ED Provider Notes (Signed)
CSN: [id number]     Arrival date & time 02/12/13  2309 History   First MD Initiated Contact with Patient 02/12/13 2321     Chief Complaint  Patient presents with  . Fall  . Head Laceration   (Consider location/radiation/quality/duration/timing/severity/associated sxs/prior Treatment) HPI Comments: Patients from [facility] nursing home with history of dementia, on Plavix. Patient had a fall from a wheelchair. She has an abrasion to the back of her head. No confirmed loss of consciousness. She complains of pain in the back of her head and pain from previous injury to her right shoulder. Level V caveat due to dementia.  Patient is a 79 y.o. female presenting with fall and scalp laceration. The history is provided by the patient.  Fall  Head Laceration    Past Medical History  Diagnosis Date  . Hypertension   . Atrial fibrillation   . Parkinson's disease   . Myocardial infarction   . Coronary artery disease   . Hyperlipidemia   . Dementia   . Diabetes mellitus   . Anxiety   . MVP (mitral valve prolapse)     History of  . Unilateral carotid artery stenosis   . Wrist fracture, right   . Peripheral neuropathy   . Lower extremity numbness   . Mitral valve regurgitation   . Leukocytosis   . Dementia   . Gait disorder   . CLL (chronic lymphocytic leukemia)    Past Surgical History  Procedure Laterality Date  . Inner ear surgery  1985    with metal placement-no mri  . Cesarean section  1964  . Wrist reconstruction  1991  . Anal fistulotomy  08/12/2000  . Coronary artery bypass graft    . Cardiac catheterization    . Sternal wire removal  10/29/2003    I&D of 2 upper sternal wires  . Coronary angioplasty with stent placement     Family History  Problem Relation Age of Onset  . Heart disease Mother   . Other Father 8    Bowel Obstruction  . Diabetes Maternal Grandmother    History  Substance Use Topics  . Smoking status: Never Smoker   . Smokeless tobacco: Never  Used  . Alcohol Use: No   OB History   Grav Para Term Preterm Abortions TAB SAB Ect Mult Living                 Review of Systems  Unable to perform ROS: Dementia    Allergies  Zoloft and Latex  Home Medications   Current Outpatient Rx  Name  Route  Sig  Dispense  Refill  . Acetic Acid (DOUCHE VINEGAR/WATER VA)   Vaginal   Place vaginally. Place one tablespoon of vinegar into 8oz of warm water and wash perium once weekly for UTI prevention         . busPIRone (BUSPAR) 10 MG tablet   Oral   Take 10 mg by mouth 3 (three) times daily.         . carbidopa-levodopa (SINEMET IR) 25-100 MG per tablet   Oral   Take 1 tablet by mouth 3 (three) times daily.          . carvedilol (COREG) 3.125 MG tablet   Oral   Take 3.125 mg by mouth 2 (two) times daily with a meal.         . cholecalciferol (VITAMIN D) 1000 UNITS tablet   Oral   Take 1,000 Units by mouth daily.          Marland Kitchen  clopidogrel (PLAVIX) 75 MG tablet   Oral   Take 75 mg by mouth daily.         . Cranberry 425 MG CAPS   Oral   Take 1 capsule by mouth daily.         . digoxin (LANOXIN) 0.125 MG tablet   Oral   Take 125 mcg by mouth daily.           . furosemide (LASIX) 40 MG tablet   Oral   Take 40 mg by mouth daily.         Marland Kitchen glimepiride (AMARYL) 1 MG tablet   Oral   Take 1 mg by mouth daily before breakfast.         . OLANZapine (ZYPREXA) 2.5 MG tablet   Oral   Take 2.5 mg by mouth at bedtime.         . polyethylene glycol (MIRALAX / GLYCOLAX) packet   Oral   Take 17 g by mouth daily.           . potassium chloride SA (K-DUR,KLOR-CON) 20 MEQ tablet   Oral   Take 20 mEq by mouth daily.          . QUEtiapine (SEROQUEL) 25 MG tablet   Oral   Take 25 mg by mouth every morning.         . sitaGLIPtin (JANUVIA) 50 MG tablet   Oral   Take 50 mg by mouth daily.           . cetirizine (ZYRTEC) 10 MG tablet   Oral   Take 10 mg by mouth daily as needed for allergies.          Marland Kitchen docusate sodium (COLACE) 100 MG capsule   Oral   Take 100 mg by mouth 2 (two) times daily as needed. For constipation         . nitroGLYCERIN (NITROSTAT) 0.4 MG SL tablet   Sublingual   Place 0.4 mg under the tongue every 5 (five) minutes as needed. For chest pain          BP 123/61  Pulse 66  Temp(Src) 98.5 F (36.9 C) (Oral)  Resp 15  SpO2 97% Physical Exam  Nursing note and vitals reviewed. Constitutional: She appears well-developed and well-nourished.  HENT:  Head: Normocephalic. Head is without raccoon's eyes and without Battle's sign.  Right Ear: Tympanic membrane, external ear and ear canal normal. No hemotympanum.  Left Ear: Tympanic membrane, external ear and ear canal normal. No hemotympanum.  Nose: Nose normal. No nasal septal hematoma.  Mouth/Throat: Uvula is midline, oropharynx is clear and moist and mucous membranes are normal.  Small superficial hemostatic abrasion to posterior scalp.   Eyes: Conjunctivae, EOM and lids are normal. Pupils are equal, round, and reactive to light. Right eye exhibits no nystagmus. Left eye exhibits no nystagmus.  No visible hyphema noted  Neck: Normal range of motion. Neck supple.  No c-spine tenderness. Pt moving head without problem.   Cardiovascular: Normal rate and regular rhythm.   Pulmonary/Chest: Effort normal and breath sounds normal.  Abdominal: Soft. There is no tenderness.  Musculoskeletal:       Cervical back: She exhibits normal range of motion, no tenderness and no bony tenderness.       Thoracic back: She exhibits no tenderness and no bony tenderness.       Lumbar back: She exhibits no tenderness and no bony tenderness.  Neurological: She is alert. She has normal strength.  No cranial nerve deficit or sensory deficit. Coordination normal. GCS eye subscore is 4. GCS verbal subscore is 5. GCS motor subscore is 6.  Skin: Skin is warm and dry.  Psychiatric: She has a normal mood and affect.    ED Course   Procedures (including critical care time) Labs Review Labs Reviewed - No data to display Imaging Review Ct Head Wo Contrast  02/13/2013   *RADIOLOGY REPORT*  Clinical Data: Fall, posterior head trauma.  CT HEAD WITHOUT CONTRAST  Technique:  Contiguous axial images were obtained from the base of the skull through the vertex without contrast.  Comparison: 06/24/2012  Findings: Prominence of the sulci, cisterns, and ventricles, in keeping with volume loss. There are subcortical and periventricular white matter hypodensities, a nonspecific finding most often seen with chronic microangiopathic changes.  There is no evidence for acute hemorrhage, overt hydrocephalus, mass lesion, or abnormal extra-axial fluid collection.  No definite CT evidence for acute cortical based (large artery) infarction. The visualized paranasal sinuses and mastoid air cells are predominately clear.  No displaced calvarial fracture.  IMPRESSION: Volume loss and white matter changes as above.  No CT evidence of acute intracranial abnormality.   Original Report Authenticated By: Jearld Lesch, M.D.   Patient seen and examined. Work-up initiated.    Vital signs reviewed and are as follows: Filed Vitals:   02/12/13 2326  BP: 123/61  Pulse: 66  Temp: 98.5 F (36.9 C)  Resp: 15   D/w Dr. Patria Mane. CT neg.    MDM   1. Head injury, initial encounter   2. Fall, initial encounter    Mechanical fall. Small abrasion to occiput cleaned with dermal cleaner and will not need repair. CT neg. Pt appears at baseline. No neck injury suspected.     Renne Crigler, PA-C 02/13/13 0127

## 2013-02-13 NOTE — ED Notes (Signed)
[facility] called for transport back to facility 

## 2013-02-13 NOTE — ED Provider Notes (Signed)
Medical screening examination/treatment/procedure(s) were performed by non-physician practitioner and as supervising physician I was immediately available for consultation/collaboration.  Tamre Cass M Yichen Gilardi, MD 02/13/13 0338 

## 2013-03-19 ENCOUNTER — Telehealth: Payer: Self-pay | Admitting: Neurology

## 2013-03-19 NOTE — Telephone Encounter (Signed)
I called the daughter. I reviewed a letter, the latter was written in April 2014, the patient was last seen in August of 2014. At the time she was seen in August of 2014, the letter information is still accurate.

## 2013-04-23 ENCOUNTER — Encounter: Payer: Self-pay | Admitting: Neurology

## 2013-04-23 ENCOUNTER — Telehealth: Payer: Self-pay | Admitting: Neurology

## 2013-04-23 NOTE — Telephone Encounter (Signed)
The patient's daughter left a message that she has faxed over her mother's POA and with it, information on what the letter needs to say according to the attorney, something about incapacitated or mentally incompetent.  I left her a message saying I have not received the POA, and that I see he has already written a letter. Also stated it is up to the doctor for further documentation.  [phone]

## 2013-04-23 NOTE — Telephone Encounter (Signed)
I called the daughter. The patient herself apparently signed a power of attorney document. They are wanting me to indicate that the patient was competent to sign the document, but she is no longer coughing at this time. I have already dictated a letter 6 months ago saying that the patient was incontinent. The patient scoring a moderate level of dementia at this time, with significant physical disabilities as well from her Parkinson's disease. I will dictate a letter regarding her physical disabilities.

## 2013-07-11 ENCOUNTER — Encounter (HOSPITAL_COMMUNITY): Payer: Self-pay | Admitting: Emergency Medicine

## 2013-07-11 ENCOUNTER — Emergency Department (HOSPITAL_COMMUNITY)
Admission: EM | Admit: 2013-07-11 | Discharge: 2013-07-11 | Disposition: A | Discharge status: Home / Self Care | Payer: Medicare PPO | Attending: Emergency Medicine | Admitting: Emergency Medicine

## 2013-07-11 ENCOUNTER — Emergency Department (HOSPITAL_COMMUNITY): Payer: Medicare PPO

## 2013-07-11 DIAGNOSIS — S0990XA Unspecified injury of head, initial encounter: Secondary | ICD-10-CM | POA: Insufficient documentation

## 2013-07-11 DIAGNOSIS — G20A1 Parkinson's disease without dyskinesia, without mention of fluctuations: Secondary | ICD-10-CM | POA: Insufficient documentation

## 2013-07-11 DIAGNOSIS — Z91040 Latex allergy status: Secondary | ICD-10-CM | POA: Insufficient documentation

## 2013-07-11 DIAGNOSIS — R011 Cardiac murmur, unspecified: Secondary | ICD-10-CM | POA: Insufficient documentation

## 2013-07-11 DIAGNOSIS — Y929 Unspecified place or not applicable: Secondary | ICD-10-CM | POA: Insufficient documentation

## 2013-07-11 DIAGNOSIS — Z79899 Other long term (current) drug therapy: Secondary | ICD-10-CM | POA: Insufficient documentation

## 2013-07-11 DIAGNOSIS — Y9389 Activity, other specified: Secondary | ICD-10-CM | POA: Insufficient documentation

## 2013-07-11 DIAGNOSIS — Z8781 Personal history of (healed) traumatic fracture: Secondary | ICD-10-CM | POA: Insufficient documentation

## 2013-07-11 DIAGNOSIS — Y9210 Unspecified residential institution as the place of occurrence of the external cause: Secondary | ICD-10-CM | POA: Insufficient documentation

## 2013-07-11 DIAGNOSIS — F411 Generalized anxiety disorder: Secondary | ICD-10-CM | POA: Insufficient documentation

## 2013-07-11 DIAGNOSIS — W19XXXA Unspecified fall, initial encounter: Secondary | ICD-10-CM

## 2013-07-11 DIAGNOSIS — Z95818 Presence of other cardiac implants and grafts: Secondary | ICD-10-CM | POA: Insufficient documentation

## 2013-07-11 DIAGNOSIS — G609 Hereditary and idiopathic neuropathy, unspecified: Secondary | ICD-10-CM | POA: Insufficient documentation

## 2013-07-11 DIAGNOSIS — I252 Old myocardial infarction: Secondary | ICD-10-CM | POA: Insufficient documentation

## 2013-07-11 DIAGNOSIS — I10 Essential (primary) hypertension: Secondary | ICD-10-CM | POA: Insufficient documentation

## 2013-07-11 DIAGNOSIS — Z862 Personal history of diseases of the blood and blood-forming organs and certain disorders involving the immune mechanism: Secondary | ICD-10-CM | POA: Insufficient documentation

## 2013-07-11 DIAGNOSIS — G20 Parkinson's disease: Secondary | ICD-10-CM | POA: Insufficient documentation

## 2013-07-11 DIAGNOSIS — Z856 Personal history of leukemia: Secondary | ICD-10-CM | POA: Insufficient documentation

## 2013-07-11 DIAGNOSIS — Z951 Presence of aortocoronary bypass graft: Secondary | ICD-10-CM | POA: Insufficient documentation

## 2013-07-11 DIAGNOSIS — I2510 Atherosclerotic heart disease of native coronary artery without angina pectoris: Secondary | ICD-10-CM | POA: Insufficient documentation

## 2013-07-11 DIAGNOSIS — W1809XA Striking against other object with subsequent fall, initial encounter: Secondary | ICD-10-CM | POA: Insufficient documentation

## 2013-07-11 DIAGNOSIS — E119 Type 2 diabetes mellitus without complications: Secondary | ICD-10-CM | POA: Insufficient documentation

## 2013-07-11 DIAGNOSIS — Z9861 Coronary angioplasty status: Secondary | ICD-10-CM | POA: Insufficient documentation

## 2013-07-11 DIAGNOSIS — I4891 Unspecified atrial fibrillation: Secondary | ICD-10-CM | POA: Insufficient documentation

## 2013-07-11 DIAGNOSIS — F0280 Dementia in other diseases classified elsewhere without behavioral disturbance: Secondary | ICD-10-CM | POA: Insufficient documentation

## 2013-07-11 DIAGNOSIS — Z7902 Long term (current) use of antithrombotics/antiplatelets: Secondary | ICD-10-CM | POA: Insufficient documentation

## 2013-07-11 LAB — GLUCOSE, CAPILLARY
GLUCOSE-CAPILLARY: 63 mg/dL — AB (ref 70–99)
GLUCOSE-CAPILLARY: 88 mg/dL (ref 70–99)

## 2013-07-11 NOTE — ED Notes (Signed)
Pt's alarm was going off.  RN went to room and silenced alarm.  Pt stated, "Thank you for turning that off.  That thing was driving me crazy."

## 2013-07-11 NOTE — ED Provider Notes (Addendum)
CSN: [id number]     Arrival date & time 07/11/13  1209 History   First MD Initiated Contact with Patient 07/11/13 1242     Chief Complaint  Patient presents with  . Fall   (Consider location/radiation/quality/duration/timing/severity/associated sxs/prior Treatment) HPI Comments: EMS patient lives at [facility] on [address] and staff stated she had a witnessed fall today when she was bending over to pick something up she lost her balance and fell forward hitting her head. They confirm that patient was at her baseline prior to being transported  Patient is a 80 y.o. female presenting with fall. The history is provided by the EMS personnel. The history is limited by the absence of a caregiver and the condition of the patient.  Fall This is a new problem. The current episode started 3 to 5 hours ago. The problem occurs constantly.    Past Medical History  Diagnosis Date  . Hypertension   . Atrial fibrillation   . Parkinson's disease   . Myocardial infarction   . Coronary artery disease   . Hyperlipidemia   . Dementia   . Diabetes mellitus   . Anxiety   . MVP (mitral valve prolapse)     History of  . Unilateral carotid artery stenosis   . Wrist fracture, right   . Peripheral neuropathy   . Lower extremity numbness   . Mitral valve regurgitation   . Leukocytosis   . Dementia   . Gait disorder   . CLL (chronic lymphocytic leukemia)    Past Surgical History  Procedure Laterality Date  . Inner ear surgery  1985    with metal placement-no mri  . Cesarean section  1964  . Wrist reconstruction  1991  . Anal fistulotomy  08/12/2000  . Coronary artery bypass graft    . Cardiac catheterization    . Sternal wire removal  10/29/2003    I&D of 2 upper sternal wires  . Coronary angioplasty with stent placement     Family History  Problem Relation Age of Onset  . Heart disease Mother   . Other Father 27    Bowel Obstruction  . Diabetes Maternal Grandmother    History   Substance Use Topics  . Smoking status: Never Smoker   . Smokeless tobacco: Never Used  . Alcohol Use: No   OB History   Grav Para Term Preterm Abortions TAB SAB Ect Mult Living                 Review of Systems  Unable to perform ROS   Allergies  Zoloft and Latex  Home Medications   Current Outpatient Rx  Name  Route  Sig  Dispense  Refill  . Acetic Acid (DOUCHE VINEGAR/WATER VA)   Vaginal   Place vaginally. Place one tablespoon of vinegar into 8oz of warm water and wash perium once weekly for UTI prevention         . busPIRone (BUSPAR) 10 MG tablet   Oral   Take 10 mg by mouth 3 (three) times daily.         . carbidopa-levodopa (SINEMET IR) 25-100 MG per tablet   Oral   Take 1 tablet by mouth 3 (three) times daily.          . carvedilol (COREG) 3.125 MG tablet   Oral   Take 3.125 mg by mouth 2 (two) times daily with a meal.         . cholecalciferol (VITAMIN D) 1000 UNITS tablet  Oral   Take 1,000 Units by mouth daily.          . clopidogrel (PLAVIX) 75 MG tablet   Oral   Take 75 mg by mouth daily.         . Cranberry 425 MG CAPS   Oral   Take 1 capsule by mouth daily.         . digoxin (LANOXIN) 0.125 MG tablet   Oral   Take 125 mcg by mouth every other day.          . furosemide (LASIX) 40 MG tablet   Oral   Take 40 mg by mouth daily.         Marland Kitchen glimepiride (AMARYL) 1 MG tablet   Oral   Take 1 mg by mouth daily before breakfast.         . OLANZapine (ZYPREXA) 5 MG tablet   Oral   Take 5 mg by mouth at bedtime.         . polyethylene glycol (MIRALAX / GLYCOLAX) packet   Oral   Take 17 g by mouth daily.           . potassium chloride SA (K-DUR,KLOR-CON) 20 MEQ tablet   Oral   Take 20 mEq by mouth daily.          . QUEtiapine (SEROQUEL) 25 MG tablet   Oral   Take 25 mg by mouth every morning.          . sitaGLIPtin (JANUVIA) 50 MG tablet   Oral   Take 50 mg by mouth daily.           . nitroGLYCERIN  (NITROSTAT) 0.4 MG SL tablet   Sublingual   Place 0.4 mg under the tongue every 5 (five) minutes as needed. For chest pain          BP 126/70  Pulse 58  Temp(Src) 97.5 F (36.4 C) (Oral)  Resp 18  SpO2 99% Physical Exam  Nursing note and vitals reviewed. Constitutional: She is oriented to person, place, and time. She appears well-developed and well-nourished. No distress.  Patient sleeping upon arrival to the room. When not respond to bruits or with stimulation but when opening her eyes she will look at you  HENT:  Head: Normocephalic and atraumatic.  Mouth/Throat: Oropharynx is clear and moist.  Eyes: Conjunctivae and EOM are normal. Pupils are equal, round, and reactive to light.  Neck: Normal range of motion. Neck supple.  Cardiovascular: Normal rate, regular rhythm and intact distal pulses.   Murmur heard. Pulmonary/Chest: Effort normal and breath sounds normal. No respiratory distress. She has no wheezes. She has no rales.  Abdominal: Soft. She exhibits no distension. There is no tenderness. There is no rebound and no guarding.  Musculoskeletal: Normal range of motion. She exhibits no edema and no tenderness.  Neurological: She is alert and oriented to person, place, and time.  Skin: Skin is warm and dry. No rash noted. No erythema.  Psychiatric: She has a normal mood and affect. Her behavior is normal.    ED Course  Procedures (including critical care time) Labs Review Labs Reviewed - No data to display Imaging Review Ct Head Wo Contrast  07/11/2013   CLINICAL DATA:  Occipital head pain, fall  EXAM: CT HEAD WITHOUT CONTRAST  TECHNIQUE: Contiguous axial images were obtained from the base of the skull through the vertex without intravenous contrast.  COMPARISON:  CT HEAD W/O CM dated 02/13/2013  FINDINGS: Mild cortical  volume loss noted with proportional ventricular prominence. Areas of periventricular white matter hypodensity are most compatible with small vessel ischemic  change. No acute hemorrhage, infarct, or mass lesion is identified. Left external capsule lacunar infarcts are reidentified. Orbits and paranasal sinuses are unremarkable. No skull fracture.  IMPRESSION: Chronic findings as above without acute intracranial abnormality.   Electronically Signed   By: Conchita Paris M.D.   On: 07/11/2013 13:56    EKG Interpretation    Date/Time:  Saturday July 11 2013 12:35:29 EST Ventricular Rate:  58 PR Interval:  185 QRS Duration: 94 QT Interval:  418 QTC Calculation: 410 R Axis:   54 Text Interpretation:  Normal sinus rhythm Baseline wander Nonspecific ST and T wave abnormality No significant change since last tracing Confirmed by Maryan Rued  MD, Florencia Zaccaro ([id number]) on 07/11/2013 12:44:40 PM            MDM   1. Fall   2. Head injury     Patient with a history of dementia who gives no history but comes from assisted facility who states she had a mechanical fall today when she was bending over. Patient sent here for further evaluation. She'll not respond on exam however when opening her eyes she will look at you. Her vital signs are stable repeat blood pressure was 127/60. No external evidence of trauma able to range all extremities without any sign of pain. Small red mark over her the right thigh. We'll get a head CT to further evaluate. Patient does take Plavix  2:51 PM Head CT neg will d/c back to facility.  Blanchie Dessert, MD 07/11/13 [place], MD 07/11/13 1455

## 2013-07-11 NOTE — ED Notes (Signed)
Called for transport

## 2013-07-11 NOTE — ED Notes (Signed)
Bed: WA02 Expected date: 07/11/13 Expected time: 12:08 PM Means of arrival: Ambulance Comments: Fall

## 2013-07-11 NOTE — ED Notes (Signed)
Pt went back to [facility] with [facility].

## 2013-07-11 NOTE — ED Notes (Signed)
Pt presents from nursing home after fall.  Pt has area of redness that looks like an abrasion over right eye.  EMS unable to state if that was related to fall.  Nursing home staff informed EMS that pt was leaning forward to pick something up and fell forward hitting floor with her head.  Pt is not responding to staff when asked if she is in pain and SNF staff informed EMS that this behavior is "normal."  Pt does not grimace or reactive to passive movement of extremities.  Lung sounds are diminished but clear.  Pt did not take deep breaths when asked to do so.  Breathing is even and unlabored.  Bowel sounds are present and abdomen is soft and patient does not grimace or respond to palpation.  Pulses are palpable in all extremities with mild non-pitting edema to top of feet bilat.  Pt is not on narcotic pain relievers, but is taking a number of sedating medications (Buspar and Seroquel).  Pt placed on cardiac monitor.

## 2013-07-11 NOTE — ED Notes (Signed)
Per EMS - pt comes from [facility] on [address] post fall earlier today.  Staff at facility state pt hit her head.  Pt was bending over to pick something up off floor and lost her balance, falling forward.  Paperwork from EMS states fall was not witnessed, but staff who gave report to paramedic states fall was witnessed.  Pt is able to speak, but is not currently speaking.  That is baseline per SNF staff.  Pt is responsive to painful stimuli.  Pt's vitals on scene:  172/90, HR 58, 16 RR and 100 02 on RA.  CBG was 71.  Pt is normally ambulatory.

## 2013-07-11 NOTE — ED Notes (Signed)
CBG 88 

## 2013-08-03 ENCOUNTER — Emergency Department (HOSPITAL_COMMUNITY): Payer: Medicare PPO

## 2013-08-03 ENCOUNTER — Observation Stay (HOSPITAL_COMMUNITY)
Admission: EM | Admit: 2013-08-03 | Discharge: 2013-08-04 | Disposition: A | Discharge status: Home / Self Care | Payer: Medicare PPO | Attending: Internal Medicine | Admitting: Internal Medicine

## 2013-08-03 ENCOUNTER — Encounter (HOSPITAL_COMMUNITY): Payer: Self-pay | Admitting: Emergency Medicine

## 2013-08-03 DIAGNOSIS — N390 Urinary tract infection, site not specified: Secondary | ICD-10-CM

## 2013-08-03 DIAGNOSIS — I2510 Atherosclerotic heart disease of native coronary artery without angina pectoris: Secondary | ICD-10-CM | POA: Insufficient documentation

## 2013-08-03 DIAGNOSIS — G459 Transient cerebral ischemic attack, unspecified: Principal | ICD-10-CM

## 2013-08-03 DIAGNOSIS — I252 Old myocardial infarction: Secondary | ICD-10-CM | POA: Insufficient documentation

## 2013-08-03 DIAGNOSIS — D638 Anemia in other chronic diseases classified elsewhere: Secondary | ICD-10-CM

## 2013-08-03 DIAGNOSIS — R011 Cardiac murmur, unspecified: Secondary | ICD-10-CM | POA: Insufficient documentation

## 2013-08-03 DIAGNOSIS — R532 Functional quadriplegia: Secondary | ICD-10-CM

## 2013-08-03 DIAGNOSIS — Z8781 Personal history of (healed) traumatic fracture: Secondary | ICD-10-CM | POA: Insufficient documentation

## 2013-08-03 DIAGNOSIS — Z9889 Other specified postprocedural states: Secondary | ICD-10-CM | POA: Insufficient documentation

## 2013-08-03 DIAGNOSIS — Z951 Presence of aortocoronary bypass graft: Secondary | ICD-10-CM | POA: Insufficient documentation

## 2013-08-03 DIAGNOSIS — G20A1 Parkinson's disease without dyskinesia, without mention of fluctuations: Secondary | ICD-10-CM

## 2013-08-03 DIAGNOSIS — G20 Parkinson's disease: Secondary | ICD-10-CM | POA: Insufficient documentation

## 2013-08-03 DIAGNOSIS — C9110 Chronic lymphocytic leukemia of B-cell type not having achieved remission: Secondary | ICD-10-CM | POA: Insufficient documentation

## 2013-08-03 DIAGNOSIS — G609 Hereditary and idiopathic neuropathy, unspecified: Secondary | ICD-10-CM | POA: Insufficient documentation

## 2013-08-03 DIAGNOSIS — E876 Hypokalemia: Secondary | ICD-10-CM

## 2013-08-03 DIAGNOSIS — E119 Type 2 diabetes mellitus without complications: Secondary | ICD-10-CM

## 2013-08-03 DIAGNOSIS — F0390 Unspecified dementia without behavioral disturbance: Secondary | ICD-10-CM

## 2013-08-03 DIAGNOSIS — G9340 Encephalopathy, unspecified: Secondary | ICD-10-CM

## 2013-08-03 DIAGNOSIS — E860 Dehydration: Secondary | ICD-10-CM

## 2013-08-03 DIAGNOSIS — D72829 Elevated white blood cell count, unspecified: Secondary | ICD-10-CM

## 2013-08-03 DIAGNOSIS — N179 Acute kidney failure, unspecified: Secondary | ICD-10-CM

## 2013-08-03 DIAGNOSIS — R269 Unspecified abnormalities of gait and mobility: Secondary | ICD-10-CM

## 2013-08-03 DIAGNOSIS — Z91040 Latex allergy status: Secondary | ICD-10-CM | POA: Insufficient documentation

## 2013-08-03 DIAGNOSIS — I4891 Unspecified atrial fibrillation: Secondary | ICD-10-CM

## 2013-08-03 DIAGNOSIS — Z9861 Coronary angioplasty status: Secondary | ICD-10-CM | POA: Insufficient documentation

## 2013-08-03 DIAGNOSIS — E785 Hyperlipidemia, unspecified: Secondary | ICD-10-CM | POA: Insufficient documentation

## 2013-08-03 DIAGNOSIS — I059 Rheumatic mitral valve disease, unspecified: Secondary | ICD-10-CM | POA: Insufficient documentation

## 2013-08-03 DIAGNOSIS — F411 Generalized anxiety disorder: Secondary | ICD-10-CM | POA: Insufficient documentation

## 2013-08-03 DIAGNOSIS — I10 Essential (primary) hypertension: Secondary | ICD-10-CM | POA: Insufficient documentation

## 2013-08-03 DIAGNOSIS — Z888 Allergy status to other drugs, medicaments and biological substances status: Secondary | ICD-10-CM | POA: Insufficient documentation

## 2013-08-03 LAB — COMPREHENSIVE METABOLIC PANEL
ALK PHOS: 99 U/L (ref 39–117)
AST: 18 U/L (ref 0–37)
Albumin: 3.8 g/dL (ref 3.5–5.2)
BILIRUBIN TOTAL: 0.2 mg/dL — AB (ref 0.3–1.2)
BUN: 27 mg/dL — ABNORMAL HIGH (ref 6–23)
CO2: 30 meq/L (ref 19–32)
Calcium: 9.2 mg/dL (ref 8.4–10.5)
Chloride: 101 mEq/L (ref 96–112)
Creatinine, Ser: 1.43 mg/dL — ABNORMAL HIGH (ref 0.50–1.10)
GFR calc Af Amer: 40 mL/min — ABNORMAL LOW (ref >90)
GFR, EST NON AFRICAN AMERICAN: 34 mL/min — AB (ref >90)
GLUCOSE: 130 mg/dL — AB (ref 70–99)
POTASSIUM: 4.3 meq/L (ref 3.7–5.3)
SODIUM: 143 meq/L (ref 137–147)
Total Protein: 7.2 g/dL (ref 6.0–8.3)

## 2013-08-03 LAB — DIFFERENTIAL
BASOS PCT: 0 % (ref 0–1)
Basophils Absolute: 0 10*3/uL (ref 0.0–0.1)
Eosinophils Absolute: 0.2 10*3/uL (ref 0.0–0.7)
Eosinophils Relative: 1 % (ref 0–5)
LYMPHS PCT: 79 % — AB (ref 12–46)
Lymphs Abs: 19 10*3/uL — ABNORMAL HIGH (ref 0.7–4.0)
MONO ABS: 0.7 10*3/uL (ref 0.1–1.0)
Monocytes Relative: 3 % (ref 3–12)
NEUTROS ABS: 4.1 10*3/uL (ref 1.7–7.7)
Neutrophils Relative %: 17 % — ABNORMAL LOW (ref 43–77)

## 2013-08-03 LAB — RAPID URINE DRUG SCREEN, HOSP PERFORMED
Amphetamines: NOT DETECTED
BARBITURATES: NOT DETECTED
Benzodiazepines: NOT DETECTED
Cocaine: NOT DETECTED
Opiates: NOT DETECTED
Tetrahydrocannabinol: NOT DETECTED

## 2013-08-03 LAB — GLUCOSE, CAPILLARY
GLUCOSE-CAPILLARY: 52 mg/dL — AB (ref 70–99)
Glucose-Capillary: 151 mg/dL — ABNORMAL HIGH (ref 70–99)
Glucose-Capillary: 117 mg/dL — ABNORMAL HIGH (ref 70–99)
Glucose-Capillary: 70 mg/dL (ref 70–99)

## 2013-08-03 LAB — ETHANOL: Alcohol, Ethyl (B): <11 mg/dL (ref 0–11)

## 2013-08-03 LAB — POCT I-STAT, CHEM 8
BUN: 26 mg/dL — ABNORMAL HIGH (ref 6–23)
CALCIUM ION: 1.18 mmol/L (ref 1.13–1.30)
CHLORIDE: 99 meq/L (ref 96–112)
Creatinine, Ser: 1.7 mg/dL — ABNORMAL HIGH (ref 0.50–1.10)
Glucose, Bld: 130 mg/dL — ABNORMAL HIGH (ref 70–99)
HEMATOCRIT: 37 % (ref 36.0–46.0)
Hemoglobin: 12.6 g/dL (ref 12.0–15.0)
POTASSIUM: 4 meq/L (ref 3.7–5.3)
SODIUM: 142 meq/L (ref 137–147)
TCO2: 30 mmol/L (ref 0–100)

## 2013-08-03 LAB — HEMOGLOBIN A1C
HEMOGLOBIN A1C: 6 % — AB (ref <5.7)
MEAN PLASMA GLUCOSE: 126 mg/dL — AB (ref <117)

## 2013-08-03 LAB — CBC
HCT: 36.6 % (ref 36.0–46.0)
HCT: 36.3 % (ref 36.0–46.0)
HEMOGLOBIN: 12.1 g/dL (ref 12.0–15.0)
Hemoglobin: 12.2 g/dL (ref 12.0–15.0)
MCH: 28 pg (ref 26.0–34.0)
MCH: 28.5 pg (ref 26.0–34.0)
MCHC: 33.1 g/dL (ref 30.0–36.0)
MCHC: 33.6 g/dL (ref 30.0–36.0)
MCV: 84.7 fL (ref 78.0–100.0)
MCV: 84.8 fL (ref 78.0–100.0)
Platelets: 168 10*3/uL (ref 150–400)
Platelets: 172 10*3/uL (ref 150–400)
RBC: 4.32 MIL/uL (ref 3.87–5.11)
RBC: 4.28 MIL/uL (ref 3.87–5.11)
RDW: 14.6 % (ref 11.5–15.5)
RDW: 14.6 % (ref 11.5–15.5)
WBC: 24 10*3/uL — ABNORMAL HIGH (ref 4.0–10.5)
WBC: 20.5 10*3/uL — AB (ref 4.0–10.5)

## 2013-08-03 LAB — URINE MICROSCOPIC-ADD ON

## 2013-08-03 LAB — URINALYSIS, ROUTINE W REFLEX MICROSCOPIC
Bilirubin Urine: NEGATIVE
GLUCOSE, UA: NEGATIVE mg/dL
KETONES UR: NEGATIVE mg/dL
NITRITE: NEGATIVE
Protein, ur: NEGATIVE mg/dL
SPECIFIC GRAVITY, URINE: 1.013 (ref 1.005–1.030)
Urobilinogen, UA: 0.2 mg/dL (ref 0.0–1.0)
pH: 6.5 (ref 5.0–8.0)

## 2013-08-03 LAB — POCT I-STAT TROPONIN I: TROPONIN I, POC: 0.01 ng/mL (ref 0.00–0.08)

## 2013-08-03 LAB — PROTIME-INR
INR: 1.02 (ref 0.00–1.49)
Prothrombin Time: 13.2 seconds (ref 11.6–15.2)

## 2013-08-03 LAB — CREATININE, SERUM
CREATININE: 1.38 mg/dL — AB (ref 0.50–1.10)
GFR, EST AFRICAN AMERICAN: 41 mL/min — AB (ref >90)
GFR, EST NON AFRICAN AMERICAN: 36 mL/min — AB (ref >90)

## 2013-08-03 LAB — APTT: aPTT: 25 seconds (ref 24–37)

## 2013-08-03 LAB — TROPONIN I: Troponin I: <0.3 ng/mL (ref <0.30)

## 2013-08-03 MED ORDER — ONDANSETRON HCL 4 MG/2ML IJ SOLN: 4.0000 mg | Freq: Four times a day (QID) | INTRAMUSCULAR | Status: DC | PRN

## 2013-08-03 MED ORDER — ENOXAPARIN SODIUM 30 MG/0.3ML ~~LOC~~ SOLN
30.0000 mg | SUBCUTANEOUS | Status: DC
Start: 2013-08-03 — End: 2013-08-04
  Administered 2013-08-03: 30 mg via SUBCUTANEOUS
  Filled 2013-08-03 (×2): qty 0.3

## 2013-08-03 MED ORDER — DEXTROSE 5 % IV SOLN
1.0000 g | INTRAVENOUS | Status: DC
  Administered 2013-08-03: 1 g via INTRAVENOUS
  Filled 2013-08-03 (×2): qty 10

## 2013-08-03 MED ORDER — BUSPIRONE HCL 10 MG PO TABS
10.0000 mg | ORAL_TABLET | Freq: Three times a day (TID) | ORAL | Status: DC
  Administered 2013-08-03 – 2013-08-04 (×2): 10 mg via ORAL
  Filled 2013-08-03 (×5): qty 1

## 2013-08-03 MED ORDER — SODIUM CHLORIDE 0.9 % IV SOLN
Freq: Once | INTRAVENOUS | Status: AC
Start: 2013-08-03 — End: 2013-08-03

## 2013-08-03 MED ORDER — HYDROMORPHONE HCL PF 1 MG/ML IJ SOLN: 0.5000 mg | INTRAMUSCULAR | Status: DC | PRN

## 2013-08-03 MED ORDER — LORAZEPAM 2 MG/ML IJ SOLN
0.5000 mg | INTRAMUSCULAR | Status: DC | PRN
  Administered 2013-08-03: 0.5 mg via INTRAMUSCULAR
  Filled 2013-08-03: qty 1

## 2013-08-03 MED ORDER — SODIUM CHLORIDE 0.9 % IV SOLN
INTRAVENOUS | Status: DC
  Administered 2013-08-03: 16:00:00 via INTRAVENOUS

## 2013-08-03 MED ORDER — ACETAMINOPHEN 650 MG RE SUPP: 650.0000 mg | Freq: Four times a day (QID) | RECTAL | Status: DC | PRN

## 2013-08-03 MED ORDER — HYDROCODONE-ACETAMINOPHEN 5-325 MG PO TABS: 1.0000 | ORAL_TABLET | Freq: Four times a day (QID) | ORAL | Status: DC | PRN

## 2013-08-03 MED ORDER — OLANZAPINE 5 MG PO TABS
5.0000 mg | ORAL_TABLET | Freq: Every day | ORAL | Status: DC
  Administered 2013-08-03: 5 mg via ORAL
  Filled 2013-08-03 (×2): qty 1

## 2013-08-03 MED ORDER — QUETIAPINE FUMARATE 25 MG PO TABS
25.0000 mg | ORAL_TABLET | Freq: Every morning | ORAL | Status: DC
  Administered 2013-08-04: 25 mg via ORAL
  Filled 2013-08-03 (×2): qty 1

## 2013-08-03 MED ORDER — DIGOXIN 125 MCG PO TABS
125.0000 ug | ORAL_TABLET | ORAL | Status: DC
  Filled 2013-08-03: qty 1

## 2013-08-03 MED ORDER — ACETAMINOPHEN 325 MG PO TABS: 650.0000 mg | ORAL_TABLET | Freq: Four times a day (QID) | ORAL | Status: DC | PRN

## 2013-08-03 MED ORDER — CLOPIDOGREL BISULFATE 75 MG PO TABS
75.0000 mg | ORAL_TABLET | Freq: Every day | ORAL | Status: DC
  Administered 2013-08-04: 75 mg via ORAL
  Filled 2013-08-03: qty 1

## 2013-08-03 MED ORDER — CARBIDOPA-LEVODOPA 25-100 MG PO TABS
1.0000 | ORAL_TABLET | Freq: Three times a day (TID) | ORAL | Status: DC
  Administered 2013-08-03 – 2013-08-04 (×2): 1 via ORAL
  Filled 2013-08-03 (×5): qty 1

## 2013-08-03 MED ORDER — ONDANSETRON HCL 4 MG PO TABS: 4.0000 mg | ORAL_TABLET | Freq: Four times a day (QID) | ORAL | Status: DC | PRN

## 2013-08-03 NOTE — ED Provider Notes (Signed)
CSN: [id number]     Arrival date & time 08/03/13  0911 History   First MD Initiated Contact with Patient 08/03/13 [phone]     Chief Complaint  Patient presents with  . Code Stroke    An emergency department physician performed an initial assessment on this suspected stroke patient at 0914 (Ghim).  HPI   80 yo F with hx of parkinson's , CAD, DM who presents with sudden onset decline in mental state. Per family patient was eating this morning and suddenly started drooling and was not conversant. At baseline she is verbal but confused. They deny any fevers, cough, no reported pain or trauma. No further information is available at this time.   Past Medical History  Diagnosis Date  . Hypertension   . Atrial fibrillation   . Parkinson's disease   . Myocardial infarction   . Coronary artery disease   . Hyperlipidemia   . Dementia   . Diabetes mellitus   . Anxiety   . MVP (mitral valve prolapse)     History of  . Unilateral carotid artery stenosis   . Wrist fracture, right   . Peripheral neuropathy   . Lower extremity numbness   . Mitral valve regurgitation   . Leukocytosis   . Dementia   . Gait disorder   . CLL (chronic lymphocytic leukemia)    Past Surgical History  Procedure Laterality Date  . Inner ear surgery  1985    with metal placement-no mri  . Cesarean section  1964  . Wrist reconstruction  1991  . Anal fistulotomy  08/12/2000  . Coronary artery bypass graft    . Cardiac catheterization    . Sternal wire removal  10/29/2003    I&D of 2 upper sternal wires  . Coronary angioplasty with stent placement     Family History  Problem Relation Age of Onset  . Heart disease Mother   . Other Father 41    Bowel Obstruction  . Diabetes Maternal Grandmother    History  Substance Use Topics  . Smoking status: Never Smoker   . Smokeless tobacco: Never Used  . Alcohol Use: No   OB History   Grav Para Term Preterm Abortions TAB SAB Ect Mult Living                 Review  of Systems  Unable to perform ROS: Mental status change      Allergies  Zoloft and Latex  Home Medications   No current outpatient prescriptions on file. BP 167/63  Pulse 64  Temp(Src) 98 F (36.7 C) (Oral)  Resp 20  Wt 102 lb 12.8 oz (46.63 kg)  SpO2 100% Physical Exam  Constitutional: She appears well-developed and well-nourished. No distress.  HENT:  Head: Normocephalic and atraumatic.  Nose: Nose normal.  Mouth/Throat: Oropharynx is clear and moist.  Eyes: EOM are normal. Pupils are equal, round, and reactive to light.  Neck: Normal range of motion. Neck supple. No tracheal deviation present.  Cardiovascular: Normal rate, regular rhythm and intact distal pulses.   Murmur heard. Pulmonary/Chest: Effort normal and breath sounds normal. She has no rales.  Abdominal: Soft. Bowel sounds are normal. She exhibits no distension. There is no tenderness. There is no rebound and no guarding.  Musculoskeletal: Normal range of motion. She exhibits no tenderness.  Neurological:  Not alert to person , place or time.   No obvious focal deficit.   Skin: Skin is warm and dry. No rash noted.  ED Course  Procedures (including critical care time) Labs Review Labs Reviewed  CBC - Abnormal; Notable for the following:    WBC 24.0 (*)    All other components within normal limits  DIFFERENTIAL - Abnormal; Notable for the following:    Neutrophils Relative % 17 (*)    Lymphocytes Relative 79 (*)    Lymphs Abs 19.0 (*)    All other components within normal limits  COMPREHENSIVE METABOLIC PANEL - Abnormal; Notable for the following:    Glucose, Bld 130 (*)    BUN 27 (*)    Creatinine, Ser 1.43 (*)    Total Bilirubin 0.2 (*)    GFR calc non Af Amer 34 (*)    GFR calc Af Amer 40 (*)    All other components within normal limits  URINALYSIS, ROUTINE W REFLEX MICROSCOPIC - Abnormal; Notable for the following:    APPearance CLOUDY (*)    Hgb urine dipstick TRACE (*)    Leukocytes, UA  TRACE (*)    All other components within normal limits  GLUCOSE, CAPILLARY - Abnormal; Notable for the following:    Glucose-Capillary 151 (*)    All other components within normal limits  URINE MICROSCOPIC-ADD ON - Abnormal; Notable for the following:    Bacteria, UA MANY (*)    All other components within normal limits  GLUCOSE, CAPILLARY - Abnormal; Notable for the following:    Glucose-Capillary 52 (*)    All other components within normal limits  GLUCOSE, CAPILLARY - Abnormal; Notable for the following:    Glucose-Capillary 117 (*)    All other components within normal limits  POCT I-STAT, CHEM 8 - Abnormal; Notable for the following:    BUN 26 (*)    Creatinine, Ser 1.70 (*)    Glucose, Bld 130 (*)    All other components within normal limits  URINE CULTURE  CULTURE, BLOOD (ROUTINE X 2)  CULTURE, BLOOD (ROUTINE X 2)  URINE CULTURE  ETHANOL  PROTIME-INR  APTT  TROPONIN I  URINE RAPID DRUG SCREEN (HOSP PERFORMED)  URINALYSIS, ROUTINE W REFLEX MICROSCOPIC  TSH  HEMOGLOBIN A1C  CBC  CREATININE, SERUM  POCT I-STAT TROPONIN I   Imaging Review Ct Head Wo Contrast  08/03/2013   CLINICAL DATA:  Left facial droop  EXAM: CT HEAD WITHOUT CONTRAST  TECHNIQUE: Contiguous axial images were obtained from the base of the skull through the vertex without intravenous contrast.  COMPARISON:  CT HEAD W/O CM dated 07/11/2013; CT HEAD W/O CM dated 02/13/2013  FINDINGS: No hemorrhage or extra-axial fluid. Diffuse atrophy with mild white matter low attenuation. No evidence of focal low attenuation to suggest infarct. Calvarium is intact.  IMPRESSION: Chronic involutional change with no acute findings.  Critical Value/emergent results were called by telephone at the time of interpretation on 08/03/2013 at 9:34 AM to Dr. Doy Mince , who verbally acknowledged these results.   Electronically Signed   By: Skipper Cliche M.D.   On: 08/03/2013 09:34      MDM   Final diagnoses:  TIA (transient  ischemic attack)  A-fib  Abnormality of gait  Acute encephalopathy  Acute kidney injury    80 yo F presented to ED with concern for stroke. She was taken emergently to the [place]. CT head with NAICA. Neurology team at bedside, see their notes for further details in short felt given pts other co morbidities pt is not a candidate for TPA. Also do not recommend further work up for stroke  as management would not change. Lab work here with AKI and leokocytosis. Patient remains confused. She is mumbling to herself. No further hx is available. Case discussed with my attending Dr. Christy Gentles. Patient admitted to the [facility] hospitalist team for further care.     Ruthell Rummage, MD 08/03/13 [phone]

## 2013-08-03 NOTE — ED Provider Notes (Signed)
Date: 08/03/2013  Rate: 63  Rhythm: normal sinus rhythm  QRS Axis: normal  Intervals: QT prolonged  ST/T Wave abnormalities: nonspecific ST changes  Conduction Disutrbances:none     Sharyon Cable, MD 08/03/13 1651

## 2013-08-03 NOTE — H&P (Signed)
History and Physical       Hospital Admission Note Date: 08/03/2013  Patient name: April Baker Medical record number: [id number] Date of birth: 01/18/1933 Age: 80 y.o. Gender: female  PCP: Adela Ports, MD    Chief Complaint:  Confused with unresponsive episode today  HPI: Patient is a 80 year old female from [facility] with history of advanced dementia and Parkinson's disease, hypertension, atrial fibrillation, coronary disease, hyperlipidemia was brought from the facility for unresponsive episode today. Patient is unable to give any history oral and keeps mumbling to herself. Apparently patient's daughter was at the facility this morning and feeding the patient had breakfast when she became unresponsive, slumped over and began to drool. There was no seizure activity documented. EMS was called who felt patient had left facial drooping and left drip weaker. Patient was brought to the ER as code stroke, patient was seen by neurology, Dr. Doy Mince who did not feel patient was a TPA candidate and do to poor functional status and advanced dementia, stroke workup would likely not be beneficial to the patient. Patient's daughter did not want any further invasive therapies. She was not considered anti-coagulation candidate secondary to advanced dementia and fall risk. Patient was noted to have mild acute kidney injury with creatinine of 1.7 with BUN of 26, baseline 0.9 last year. WBC is 24.0 with lymphocytosis. Due to mild acute kidney injury and possibility of infection causing worsening of her mental status patient was admitted for observation overnight  Review of Systems:  Unable to obtain from the patient due to her mental status  Past Medical History: Past Medical History  Diagnosis Date  . Hypertension   . Atrial fibrillation   . Parkinson's disease   . Myocardial infarction   . Coronary artery disease   .  Hyperlipidemia   . Dementia   . Diabetes mellitus   . Anxiety   . MVP (mitral valve prolapse)     History of  . Unilateral carotid artery stenosis   . Wrist fracture, right   . Peripheral neuropathy   . Lower extremity numbness   . Mitral valve regurgitation   . Leukocytosis   . Dementia   . Gait disorder   . CLL (chronic lymphocytic leukemia)    Past Surgical History  Procedure Laterality Date  . Inner ear surgery  1985    with metal placement-no mri  . Cesarean section  1964  . Wrist reconstruction  1991  . Anal fistulotomy  08/12/2000  . Coronary artery bypass graft    . Cardiac catheterization    . Sternal wire removal  10/29/2003    I&D of 2 upper sternal wires  . Coronary angioplasty with stent placement      Medications: Prior to Admission medications   Medication Sig Start Date End Date Taking? Authorizing Provider  Acetic Acid (DOUCHE VINEGAR/WATER VA) Place vaginally. Place one tablespoon of vinegar into 8oz of warm water and wash perium once weekly for UTI prevention    Historical Provider, MD  busPIRone (BUSPAR) 10 MG tablet Take 10 mg by mouth 3 (three) times daily.    Historical Provider, MD  carbidopa-levodopa (SINEMET IR) 25-100 MG per tablet Take 1 tablet by mouth 3 (three) times daily.     Historical Provider, MD  carvedilol (COREG) 3.125 MG tablet Take 3.125 mg by mouth 2 (two) times daily with a meal.    Historical Provider, MD  cholecalciferol (VITAMIN D) 1000 UNITS tablet Take 1,000 Units by mouth  daily.     Historical Provider, MD  clopidogrel (PLAVIX) 75 MG tablet Take 75 mg by mouth daily.    Historical Provider, MD  Cranberry 425 MG CAPS Take 1 capsule by mouth daily.    Historical Provider, MD  digoxin (LANOXIN) 0.125 MG tablet Take 125 mcg by mouth every other day.     Historical Provider, MD  furosemide (LASIX) 40 MG tablet Take 40 mg by mouth daily.    Historical Provider, MD  glimepiride (AMARYL) 1 MG tablet Take 1 mg by mouth daily before  breakfast.    Historical Provider, MD  nitroGLYCERIN (NITROSTAT) 0.4 MG SL tablet Place 0.4 mg under the tongue every 5 (five) minutes as needed. For chest pain    Historical Provider, MD  OLANZapine (ZYPREXA) 5 MG tablet Take 5 mg by mouth at bedtime.    Historical Provider, MD  polyethylene glycol (MIRALAX / GLYCOLAX) packet Take 17 g by mouth daily.      Historical Provider, MD  potassium chloride SA (K-DUR,KLOR-CON) 20 MEQ tablet Take 20 mEq by mouth daily.     Historical Provider, MD  QUEtiapine (SEROQUEL) 25 MG tablet Take 25 mg by mouth every morning.     Historical Provider, MD  sitaGLIPtin (JANUVIA) 50 MG tablet Take 50 mg by mouth daily.      Historical Provider, MD    Allergies:   Allergies  Allergen Reactions  . Zoloft [Sertraline Hcl]   . Latex Itching    Social History:  .per records, she has not smoked or drank alcohol or used any illicit drugs. She is currently resident of [place] [facility] dementia unit  Family History: Family History  Problem Relation Age of Onset  . Heart disease Mother   . Other Father 73    Bowel Obstruction  . Diabetes Maternal Grandmother     Physical Exam: Blood pressure 178/86, pulse 63, temperature 97.5 F (36.4 C), resp. rate 23, SpO2 100.00%. General: Alert, keeping her eyes closed, mumbling to herself HEENT: normocephalic, atraumatic, anicteric sclera, pink conjunctiva, pupils equal and reactive to light and accomodation, oropharynx clear Neck: supple, no masses or lymphadenopathy, no goiter, no bruits  Heart: Regular rate and rhythm, without murmurs, rubs or gallops. Lungs: Clear to auscultation bilaterally, no wheezing, rales or rhonchi. Abdomen: Soft, nontender, nondistended, positive bowel sounds, no masses. Extremities: No clubbing, cyanosis or edema with positive pedal pulses. Neuro: Does not follow commands however moves all 4 extremities spontaneously Psych: Mumbling to herself, alert, confused Skin: no rashes or lesions, warm  and dry   LABS on Admission:  Basic Metabolic Panel:  Recent Labs Lab 08/03/13 0914 08/03/13 0924  NA 143 142  K 4.3 4.0  CL 101 99  CO2 30  --   GLUCOSE 130* 130*  BUN 27* 26*  CREATININE 1.43* 1.70*  CALCIUM 9.2  --    Liver Function Tests:  Recent Labs Lab 08/03/13 0914  AST 18  ALT <5  ALKPHOS 99  BILITOT 0.2*  PROT 7.2  ALBUMIN 3.8   No results found for this basename: LIPASE, AMYLASE,  in the last 168 hours No results found for this basename: AMMONIA,  in the last 168 hours CBC:  Recent Labs Lab 08/03/13 0914 08/03/13 0924  WBC 24.0*  --   NEUTROABS 4.1  --   HGB 12.1 12.6  HCT 36.6 37.0  MCV 84.7  --   PLT 168  --    Cardiac Enzymes:  Recent Labs Lab 08/03/13 0914  TROPONINI <  0.30   BNP: No components found with this basename: POCBNP,  CBG:  Recent Labs Lab 08/03/13 0925  GLUCAP 151*     Radiological Exams on Admission: Ct Head Wo Contrast  08/03/2013   CLINICAL DATA:  Left facial droop  EXAM: CT HEAD WITHOUT CONTRAST  TECHNIQUE: Contiguous axial images were obtained from the base of the skull through the vertex without intravenous contrast.  COMPARISON:  CT HEAD W/O CM dated 07/11/2013; CT HEAD W/O CM dated 02/13/2013  FINDINGS: No hemorrhage or extra-axial fluid. Diffuse atrophy with mild white matter low attenuation. No evidence of focal low attenuation to suggest infarct. Calvarium is intact.  IMPRESSION: Chronic involutional change with no acute findings.  Critical Value/emergent results were called by telephone at the time of interpretation on 08/03/2013 at 9:34 AM to Dr. Doy Mince , who verbally acknowledged these results.   Electronically Signed   By: Skipper Cliche M.D.   On: 08/03/2013 09:34   Ct Head Wo Contrast  07/11/2013   CLINICAL DATA:  Occipital head pain, fall  EXAM: CT HEAD WITHOUT CONTRAST  TECHNIQUE: Contiguous axial images were obtained from the base of the skull through the vertex without intravenous contrast.   COMPARISON:  CT HEAD W/O CM dated 02/13/2013  FINDINGS: Mild cortical volume loss noted with proportional ventricular prominence. Areas of periventricular white matter hypodensity are most compatible with small vessel ischemic change. No acute hemorrhage, infarct, or mass lesion is identified. Left external capsule lacunar infarcts are reidentified. Orbits and paranasal sinuses are unremarkable. No skull fracture.  IMPRESSION: Chronic findings as above without acute intracranial abnormality.   Electronically Signed   By: Conchita Paris M.D.   On: 07/11/2013 13:56    Assessment/Plan Principal Problem:   Acute kidney injury likely due to dehydration - Hold Lasix, and obtain a UA rule out UTI, continue gentle hydration  Active Problems:   DM (diabetes mellitus) Place on sliding scale insulin hold oral hypoglycemics-   Leukocytosis: She appears to have history of chronic leukocytosis per the review of previous admissions and records - UA is mildly positive for UTI, we'll start IV Rocephin  - Obtain blood cultures  - Patient does not have any URI symptoms, afebrile, chest x-ray was not done in ED      acute encephalopathy with history of Dementia with Parkinson disease with behavioral issues -  continue Sinemet, Seroquel, Zyprexa, BuSpar  - Fall precaution, PT OT     TIA (transient ischemic attack) - Continue Plavix, no further stroke workup per neuro recommendations after discussion with the family  - Serial Neurochecks   DVT prophylaxis: Lovenox  CODE STATUS:  DO NOT RESUSCITATE  Family Communication: none present in the room    Further plan will depend as patient's clinical course evolves and further radiologic and laboratory data become available.   Time Spent on Admission: 60 mins  Maryjane Benedict M.D. [facility] 08/03/2013, 1:40 PM Pager: [phone]  If 7PM-7AM, please contact night-coverage [email or web address] Password [email or web address]

## 2013-08-03 NOTE — Code Documentation (Signed)
80yo female arriving to [facility] via [place].  Patient LKW at 0800.  Patient at [facility] eating breakfast and had syncopal episode for 4-5 minutes.  Patient has a h/o dementia, Parkinson's disease, atrial fibrillation, DM and UTI.  EMS activated and assessed left grip to be weak and left facial droop.  Patient c/o headache. Code Stroke called.  Patient taken to CT on arrival.  NIHSS 5 on arrival, see documentation for details.  No acute stroke treatment at this time per Dr. Doy Mince. Bedside handoff with ED RN [place].  See code stroke log for documentation and times.

## 2013-08-03 NOTE — Consult Note (Signed)
Referring Physician: Christy Gentles    Chief Complaint: Syncope  HPI: April Baker is an 80 y.o. female who is a SNF resident.  Her daughter was at the facility this morning feeding the patient.  She acutely became unresponsive and stopped chewing.  She slumped over and began to drool.  She was then noted to have perioral cyanosis.  There was no clonic activity.  EMS was called and on their arrival she was felt to have a facial droop and possible left hand grip weakness.  Patient was unresponsive for about 4-5 minutes but was starting to arouse at their arrival.  Patient was brought in as a code stoke.   On speaking with daughter at baseline the patient has difficulty communicating due to being unable to get the words out to complete a full sentence. She is in the bed sleeping most of the time even when there are visitors.  She needs assistance for eating, dressing and toileting.  She is not incontinent.    Date last known well: Date: 08/03/2013 Time last known well: Time: 08:00 tPA Given: No: Patient back to baseline  Past Medical History  Diagnosis Date  . Hypertension   . Atrial fibrillation   . Parkinson's disease   . Myocardial infarction   . Coronary artery disease   . Hyperlipidemia   . Dementia   . Diabetes mellitus   . Anxiety   . MVP (mitral valve prolapse)     History of  . Unilateral carotid artery stenosis   . Wrist fracture, right   . Peripheral neuropathy   . Lower extremity numbness   . Mitral valve regurgitation   . Leukocytosis   . Dementia   . Gait disorder   . CLL (chronic lymphocytic leukemia)     Past Surgical History  Procedure Laterality Date  . Inner ear surgery  1985    with metal placement-no mri  . Cesarean section  1964  . Wrist reconstruction  1991  . Anal fistulotomy  08/12/2000  . Coronary artery bypass graft    . Cardiac catheterization    . Sternal wire removal  10/29/2003    I&D of 2 upper sternal wires  . Coronary angioplasty with stent  placement      Family History  Problem Relation Age of Onset  . Heart disease Mother   . Other Father 48    Bowel Obstruction  . Diabetes Maternal Grandmother    Social History:  reports that she has never smoked. She has never used smokeless tobacco. She reports that she does not drink alcohol or use illicit drugs.  Allergies:  Allergies  Allergen Reactions  . Zoloft [Sertraline Hcl]   . Latex Itching    Medications: I have reviewed the patient's current medications. Prior to Admission:  No current facility-administered medications for this encounter. Current outpatient prescriptions: Acetic Acid (DOUCHE VINEGAR/WATER VA), Place vaginally. Place one tablespoon of vinegar into 8oz of warm water and wash perium once weekly for UTI prevention, Disp: , Rfl: ;   busPIRone (BUSPAR) 10 MG tablet, Take 10 mg by mouth 3 (three) times daily., Disp: , Rfl: ;   carbidopa-levodopa (SINEMET IR) 25-100 MG per tablet, Take 1 tablet by mouth 3 (three) times daily. , Disp: , Rfl:  carvedilol (COREG) 3.125 MG tablet, Take 3.125 mg by mouth 2 (two) times daily with a meal., Disp: , Rfl: ;   cholecalciferol (VITAMIN D) 1000 UNITS tablet, Take 1,000 Units by mouth daily. , Disp: , Rfl: ;  clopidogrel (PLAVIX) 75 MG tablet, Take 75 mg by mouth daily., Disp: , Rfl: ;   Cranberry 425 MG CAPS, Take 1 capsule by mouth daily., Disp: , Rfl: ;   digoxin (LANOXIN) 0.125 MG tablet, Take 125 mcg by mouth every other day. , Disp: , Rfl:  furosemide (LASIX) 40 MG tablet, Take 40 mg by mouth daily., Disp: , Rfl: ;   glimepiride (AMARYL) 1 MG tablet, Take 1 mg by mouth daily before breakfast., Disp: , Rfl: ;   nitroGLYCERIN (NITROSTAT) 0.4 MG SL tablet, Place 0.4 mg under the tongue every 5 (five) minutes as needed. For chest pain, Disp: , Rfl: OLANZapine (ZYPREXA) 5 MG tablet, Take 5 mg by mouth at bedtime., Disp: , Rfl:  polyethylene glycol (MIRALAX / GLYCOLAX) packet, Take 17 g by mouth daily.  , Disp: , Rfl: ;    potassium chloride SA (K-DUR,KLOR-CON) 20 MEQ tablet, Take 20 mEq by mouth daily. , Disp: , Rfl: ;   QUEtiapine (SEROQUEL) 25 MG tablet, Take 25 mg by mouth every morning. , Disp: , Rfl: ;   sitaGLIPtin (JANUVIA) 50 MG tablet, Take 50 mg by mouth daily.  , Disp: , Rfl:   ROS: Unable to obtain secondary to mental status  Physical Examination: Blood pressure 122/53, pulse 59, temperature 97.5 F (36.4 C), resp. rate 16, SpO2 99.00%.  Neurologic Examination: Mental Status: Alert.  Not oriented.  Repeats explicatives over and over.  Keeps her eyes closed for the majority of the evaluation.   Cranial Nerves: II: Patient will not cooperate for discs to be evaluated.  Pupils equal, round, reactive to light and accommodation III,IV, VI: ptosis not present, extra-ocular motions intact bilaterally V,VII: smile symmetric, facial light touch sensation normal bilaterally VIII: hearing normal bilaterally IX,X: gag reflex present XI: bilateral shoulder shrug XII: midline tongue extension Motor: Able to hold arms up against gravity.  Does not hold legs up but will move spontaneously against gravity.  No focal weakness noted.  Increased tone throughout Sensory: Responds to noxious stimuli throughout Deep Tendon Reflexes: 2+ throughout with absent AJ's bilaterally Plantars: Right: upgoing   Left: upgoing Cerebellar: Unable to perform Gait: Unsafe to test CV: pulses palpable throughout     Laboratory Studies:  Basic Metabolic Panel:  Recent Labs Lab 08/03/13 0924  NA 142  K 4.0  CL 99  GLUCOSE 130*  BUN 26*  CREATININE 1.70*    Liver Function Tests: No results found for this basename: AST, ALT, ALKPHOS, BILITOT, PROT, ALBUMIN,  in the last 168 hours No results found for this basename: LIPASE, AMYLASE,  in the last 168 hours No results found for this basename: AMMONIA,  in the last 168 hours  CBC:  Recent Labs Lab 08/03/13 0924  HGB 12.6  HCT 37.0    Cardiac Enzymes: No  results found for this basename: CKTOTAL, CKMB, CKMBINDEX, TROPONINI,  in the last 168 hours  BNP: No components found with this basename: POCBNP,   CBG:  Recent Labs Lab 08/03/13 0925  GLUCAP 151*    Microbiology: Results for orders placed during the hospital encounter of 08/09/12  URINE CULTURE     Status: None   Collection Time    08/09/12  8:22 PM      Result Value Ref Range Status   Specimen Description URINE, CLEAN CATCH   Final   Special Requests NONE   Final   Culture  Setup Time 08/10/2012 21:03   Final   Colony Count NO GROWTH  Final   Culture NO GROWTH   Final   Report Status 08/12/2012 FINAL   Final    Coagulation Studies: No results found for this basename: LABPROT, INR,  in the last 72 hours  Urinalysis: No results found for this basename: COLORURINE, APPERANCEUR, LABSPEC, PHURINE, GLUCOSEU, HGBUR, BILIRUBINUR, KETONESUR, PROTEINUR, UROBILINOGEN, NITRITE, LEUKOCYTESUR,  in the last 168 hours  Lipid Panel:    Component Value Date/Time   CHOL  Value: 119        ATP III CLASSIFICATION:  <200     mg/dL   Desirable  200-239  mg/dL   Borderline High  >=240    mg/dL   High        09/15/2010 0540   TRIG 105 09/15/2010 0540   HDL 26* 09/15/2010 0540   CHOLHDL 4.6 09/15/2010 0540   VLDL 21 09/15/2010 0540   LDLCALC  Value: 72        Total Cholesterol/HDL:CHD Risk Coronary Heart Disease Risk Table                     Men   Women  1/2 Average Risk   3.4   3.3  Average Risk       5.0   4.4  2 X Average Risk   9.6   7.1  3 X Average Risk  23.4   11.0        Use the calculated Patient Ratio above and the CHD Risk Table to determine the patient's CHD Risk.        ATP III CLASSIFICATION (LDL):  <100     mg/dL   Optimal  100-129  mg/dL   Near or Above                    Optimal  130-159  mg/dL   Borderline  160-189  mg/dL   High  >190     mg/dL   Very High 09/15/2010 0540    HgbA1C:  Lab Results  Component Value Date   HGBA1C 6.9* 05/15/2011    Urine Drug Screen:   No results  found for this basename: labopia, cocainscrnur, labbenz, amphetmu, thcu, labbarb    Alcohol Level: No results found for this basename: ETH,  in the last 168 hours  Other results: EKG: sinus rhythm at 63 bpm.  Imaging: Ct Head Wo Contrast  08/03/2013   CLINICAL DATA:  Left facial droop  EXAM: CT HEAD WITHOUT CONTRAST  TECHNIQUE: Contiguous axial images were obtained from the base of the skull through the vertex without intravenous contrast.  COMPARISON:  CT HEAD W/O CM dated 07/11/2013; CT HEAD W/O CM dated 02/13/2013  FINDINGS: No hemorrhage or extra-axial fluid. Diffuse atrophy with mild white matter low attenuation. No evidence of focal low attenuation to suggest infarct. Calvarium is intact.  IMPRESSION: Chronic involutional change with no acute findings.  Critical Value/emergent results were called by telephone at the time of interpretation on 08/03/2013 at 9:34 AM to Dr. Doy Mince , who verbally acknowledged these results.   Electronically Signed   By: Skipper Cliche M.D.   On: 08/03/2013 09:34    Assessment: 80 y.o. female presenting after a syncopal episode.  There was some question of focal symptoms initially that are no longer present on examination.  Patient with poor baseline functional status.  Not felt to be a tPA candidate and tPA not administered.  Patient with multiple vascular risk factors.  Have spoken with daughter at length and due  to poor finctioal status a work up at this time will likely not benefit the patient.  They are not interested in any invasive therapies.  Gait an issue and therefore patient would not be an anticoagulation candidate.  Patient is on Plavix currently.  Currently seems at baseline per daughter.  Stroke Risk Factors - atrial fibrillation, diabetes mellitus, hyperlipidemia, hypertension and CAD  Plan: 1. Stroke work up not indicated at this time.  Patient is in sinus rhythm. 2. Frequent neuro checks 3. Continue Plavix at current dose. 4.  No further  neurologic intervention is recommended at this time.   Alexis Goodell, MD [facility] [phone] 08/03/2013, 9:55 AM

## 2013-08-03 NOTE — Evaluation (Signed)
Physical Therapy Evaluation and Discharge Patient Details Name: April Baker MRN: [id number] DOB: Oct 17, 1932 Today's Date: 08/03/2013 Time: 4098-1191 PT Time Calculation (min): 15 min  PT Assessment / Plan / Recommendation History of Present Illness  tient is a 80 year old female from [place] [facility] dementia unit with history of advanced dementia and Parkinson's disease, hypertension, atrial fibrillation, coronary disease, hyperlipidemia was brought from the facility for unresponsive episode today. Patient is unable to give any history oral and keeps mumbling to herself. Apparently patient's daughter was at the facility this morning and feeding the patient had breakfast when she became unresponsive, slumped over and began to drool  Clinical Impression  Suspect pt functioning at or near baseline. Pt with advanced dementia and Parkinson's. Suspect pt w/c bound at [facility]. Pt with no further acute skilled PT needs at this time. If family desires PT services upon d/c she can con't them at [facility]. PT signing off. Please re-consult if needed in future.    PT Assessment  All further PT needs can be met in the next venue of care    Follow Up Recommendations  Supervision/Assistance - 24 hour  - order PT at facility if family desires    Does the patient have the potential to tolerate intense rehabilitation      Barriers to Discharge        Equipment Recommendations  None recommended by PT    Recommendations for Other Services     Frequency      Precautions / Restrictions Precautions Precautions: Fall Restrictions Weight Bearing Restrictions: No   Pertinent Vitals/Pain Pt did not appear in pain or report pain      Mobility  Bed Mobility Overal bed mobility: Needs Assistance Bed Mobility: Supine to Sit Supine to sit: Mod assist General bed mobility comments: pt received attempting to get OOB to go to bathroom Transfers Overall transfer level: Needs assistance Equipment used:   (dependent std pvt with gait belt) Transfers: Stand Pivot Transfers Stand pivot transfers: Total assist General transfer comment: dependent lift to/from [facility] with gait belt. pt with no initiation to take a step    Exercises     PT Diagnosis: Difficulty walking  PT Problem List: Decreased mobility PT Treatment Interventions:       PT Goals(Current goals can be found in the care plan section) Acute Rehab PT Goals PT Goal Formulation: No goals set, d/c therapy  Visit Information  Last PT Received On: 08/03/13 Assistance Needed: +1 History of Present Illness: tient is a 80 year old female from [place] [facility] dementia unit with history of advanced dementia and Parkinson's disease, hypertension, atrial fibrillation, coronary disease, hyperlipidemia was brought from the facility for unresponsive episode today. Patient is unable to give any history oral and keeps mumbling to herself. Apparently patient's daughter was at the facility this morning and feeding the patient had breakfast when she became unresponsive, slumped over and began to drool       Prior [place] expects to be discharged to:: Skilled nursing facility Additional Comments: pt resides at memory care unit at [facility] Prior Function Level of Independence: Needs assistance Gait / Transfers Assistance Needed: suspect uses a w/c ADL's / Homemaking Assistance Needed: assisted by resident staff Communication / Swallowing Assistance Needed: pt requires assist to feed self Comments: pt poor historian. PLOF provided in chart Communication Communication:  (impaired comprehension) Dominant Hand: Right    Cognition  Cognition Arousal/Alertness: Awake/alert Behavior During Therapy: WFL for tasks assessed/performed Overall Cognitive  Status: History of cognitive impairments - at baseline (pt with known dementia, on memory care unit) Memory: Decreased short-term memory    Extremity/Trunk Assessment Upper  Extremity Assessment Upper Extremity Assessment: Generalized weakness Lower Extremity Assessment Lower Extremity Assessment: Generalized weakness Cervical / Trunk Assessment Cervical / Trunk Assessment: Kyphotic   Balance Balance Overall balance assessment: Needs assistance Sitting-balance support: Feet supported;No upper extremity supported Sitting balance-Leahy Scale: Poor Standing balance support: Bilateral upper extremity supported Standing balance-Leahy Scale: Zero Standing balance comment: pt dependent on assist to maintain upright posture General Comments General comments (skin integrity, edema, etc.): pt dependent for perineal care s/p urination  End of Session PT - End of Session Equipment Utilized During Treatment: Gait belt Activity Tolerance: Patient tolerated treatment well Patient left: in bed;with call bell/phone within reach;with bed alarm set Nurse Communication: Mobility status  GP Functional Assessment Tool Used: clinical judgement Functional Limitation: Mobility: Walking and moving around Mobility: Walking and Moving Around Current Status ([id number]): At least 80 percent but less than 100 percent impaired, limited or restricted Mobility: Walking and Moving Around Goal Status [phone]): At least 80 percent but less than 100 percent impaired, limited or restricted Mobility: Walking and Moving Around Discharge Status [phone]): At least 80 percent but less than 100 percent impaired, limited or restricted   Kingsley Callander 08/03/2013, 4:47 PM   Kittie Plater, PT, DPT Pager #: [phone] Office #: [phone]

## 2013-08-03 NOTE — ED Notes (Signed)
Daughter in  Room w/ pt can mae but does not do it command eyes stay closed and pt will talk  And answer questions but she has repetitive answeres daughter states that she feeding her mom breakfast  This am when pt drifted off and her head rolled down she drooled and her nose ran ,daughter states that she got all of breakfast out her  Moms mouth and went to  Nurse at home and they called ems. Daughter states that hermom is asking for lunch but request has been made for speech tx to see pt regarding stroke swollow since daughter states that her mom has been eating soft foods, daughter also states that her mom has been having diff carrying on conversations and holding them for the last couple of months where her mom will just drift off

## 2013-08-03 NOTE — ED Provider Notes (Signed)
D/w dr Doy Mince Code stroke has been cancelled She is not a TPA candidate per dr Doy Mince with neurology   Sharyon Cable, MD 08/03/13 [phone]

## 2013-08-03 NOTE — ED Provider Notes (Signed)
I have personally seen and examined the patient.  I have discussed the plan of care with the resident.  I have reviewed the documentation on PMH/FH/Soc. History.  I have reviewed the documentation of the resident and agree.      Sharyon Cable, MD 08/03/13 [phone]

## 2013-08-03 NOTE — ED Notes (Signed)
Pt here from Nursing home , pt had a syncopal episode lasting aprox 4 to 5 mins , upon arousal pt had a left side facial drop and a weak left gri[p according to EMS , pt does have a history of dementia

## 2013-08-04 ENCOUNTER — Encounter (HOSPITAL_COMMUNITY): Payer: Self-pay | Admitting: Internal Medicine

## 2013-08-04 DIAGNOSIS — C9110 Chronic lymphocytic leukemia of B-cell type not having achieved remission: Secondary | ICD-10-CM

## 2013-08-04 DIAGNOSIS — R269 Unspecified abnormalities of gait and mobility: Secondary | ICD-10-CM

## 2013-08-04 HISTORY — DX: Chronic lymphocytic leukemia of B-cell type not having achieved remission: C91.10

## 2013-08-04 LAB — URINE CULTURE
COLONY COUNT: NO GROWTH
CULTURE: NO GROWTH

## 2013-08-04 LAB — BASIC METABOLIC PANEL
BUN: 24 mg/dL — AB (ref 6–23)
CHLORIDE: 102 meq/L (ref 96–112)
CO2: 28 mEq/L (ref 19–32)
Calcium: 9.1 mg/dL (ref 8.4–10.5)
Creatinine, Ser: 1.34 mg/dL — ABNORMAL HIGH (ref 0.50–1.10)
GFR calc Af Amer: 43 mL/min — ABNORMAL LOW (ref >90)
GFR calc non Af Amer: 37 mL/min — ABNORMAL LOW (ref >90)
GLUCOSE: 60 mg/dL — AB (ref 70–99)
POTASSIUM: 4 meq/L (ref 3.7–5.3)
Sodium: 143 mEq/L (ref 137–147)

## 2013-08-04 LAB — GLUCOSE, CAPILLARY: GLUCOSE-CAPILLARY: 72 mg/dL (ref 70–99)

## 2013-08-04 LAB — TSH: TSH: 1.987 u[IU]/mL (ref 0.350–4.500)

## 2013-08-04 LAB — HEMOGLOBIN A1C
Hgb A1c MFr Bld: 6 % — ABNORMAL HIGH (ref <5.7)
Mean Plasma Glucose: 126 mg/dL — ABNORMAL HIGH (ref <117)

## 2013-08-04 LAB — CBC
HCT: 34.4 % — ABNORMAL LOW (ref 36.0–46.0)
HEMOGLOBIN: 11.5 g/dL — AB (ref 12.0–15.0)
MCH: 28.5 pg (ref 26.0–34.0)
MCHC: 33.4 g/dL (ref 30.0–36.0)
MCV: 85.4 fL (ref 78.0–100.0)
Platelets: 179 10*3/uL (ref 150–400)
RBC: 4.03 MIL/uL (ref 3.87–5.11)
RDW: 14.6 % (ref 11.5–15.5)
WBC: 25.6 10*3/uL — ABNORMAL HIGH (ref 4.0–10.5)

## 2013-08-04 LAB — PATHOLOGIST SMEAR REVIEW

## 2013-08-04 MED ORDER — CEPHALEXIN 500 MG PO CAPS
500.0000 mg | ORAL_CAPSULE | Freq: Once | ORAL | Status: AC
  Administered 2013-08-04: 500 mg via ORAL
  Filled 2013-08-04: qty 1

## 2013-08-04 MED ORDER — CEPHALEXIN 500 MG PO CAPS: 500.0000 mg | ORAL_CAPSULE | Freq: Two times a day (BID) | ORAL | Status: DC

## 2013-08-04 MED ORDER — CEFTRIAXONE SODIUM 1 G IJ SOLR
1.0000 g | INTRAMUSCULAR | Status: DC
  Filled 2013-08-04: qty 10

## 2013-08-04 MED ORDER — INSULIN ASPART 100 UNIT/ML ~~LOC~~ SOLN: 0.0000 [IU] | Freq: Every day | SUBCUTANEOUS | Status: DC

## 2013-08-04 MED ORDER — INSULIN ASPART 100 UNIT/ML ~~LOC~~ SOLN: 0.0000 [IU] | Freq: Three times a day (TID) | SUBCUTANEOUS | Status: DC

## 2013-08-04 NOTE — Progress Notes (Signed)
Clinical Social Work Department BRIEF PSYCHOSOCIAL ASSESSMENT 08/04/2013  Patient:  April Baker, April Baker     Account Number:  [id number]     Admit date:  08/03/2013  Clinical Social Worker:  Adair Laundry  Date/Time:  08/04/2013 12:30 PM  Referred by:  Physician  Date Referred:  08/04/2013 Referred for  ALF Placement   Other Referral:   Interview type:  Family Other interview type:   Spoke with pt daughter at bedside    PSYCHOSOCIAL DATA Living Status:  FACILITY Admitted from facility:  [facility] Level of care:  Assisted Living Primary support name:  Valetta Close [phone] Primary support relationship to patient:  CHILD, ADULT Degree of support available:   Pt has very supportive family    CURRENT CONCERNS Current Concerns  Post-Acute Placement   Other Concerns:    SOCIAL WORK ASSESSMENT / PLAN CSW informed pt was admitted from facility and is ready to return. CSW visited pt room. Pt was sleeping and unarousable so CSW compeleted assessment with pt daughter who was at bedside. Pt daughter confirmed that was admitted from [facility] and plan will be to return. CSW called and spoke with Dottie at facility and she confirmed pt will not need new FL2 since she is observation for 1 day. Facility wanting to verify if pt will on any new meds. CSW to send dc summary to facility as soon as available. Pt daughter wanting pt to be transported by non-emergent ambulance for safety because of weather. CSW to coordinate once facility has reviewed dc summary and confirmed pt is okay to dc back.   Assessment/plan status:  Psychosocial Support/Ongoing Assessment of Needs Other assessment/ plan:   Information/referral to community resources:   None needed    PATIENT'S/FAMILY'S RESPONSE TO PLAN OF CARE: Pt family agreeable for pt to dc back to [facility] [place] [place]

## 2013-08-04 NOTE — Progress Notes (Deleted)
Discharge instructions and prescriptions given.  No questions asked, verbalized understanding.  Left via wheelchair with family members. Lorrene Reid

## 2013-08-04 NOTE — Progress Notes (Addendum)
CSW (Clinical Education officer, museum) sent [employer] summary to facility. Waiting for return phone call to confirm discharge.   ADDENDUM: CSW received call from facility confirming pt can dc back. CSW prepared pt dc packed and placed with shadow chart. CSW arranged non-emergent ambulance transport and informed pt family, pt nurse, and facility. CSW signing off   Berton Mount, [place]

## 2013-08-04 NOTE — Discharge Summary (Addendum)
Physician Discharge Summary  Patient ID: April Baker MRN: [id number] DOB/AGE: June 11, 1933 80 y.o.  Admit date: 08/03/2013 Discharge date: 08/04/2013  Primary Care Physician:  Adela Ports, MD  Discharge Diagnoses:   . Acute kidney injury- improved   . TIA (transient ischemic attack) . Parkinson disease . DM (diabetes mellitus) . advanced Dementia with sundowning and behavioral issues  . Dehydration . Acute encephalopathy . mild UTI  . chronic Leukocytosis due to history of CLL  Consults:  Oncology, Dr. Julien Nordmann via phone consultation   Recommendations for Outpatient Follow-up:  Please follow urine cultures and sensitivities, sent 08/03/13  Lasix has been STOPPED due to acute renal insufficiency at the time of admission, follow the BMET   Allergies:   Allergies  Allergen Reactions  . Zoloft [Sertraline Hcl]   . Latex Itching     Discharge Medications:   Medication List    STOP taking these medications       furosemide 40 MG tablet  Commonly known as:  LASIX     potassium chloride SA 20 MEQ tablet  Commonly known as:  K-DUR,KLOR-CON      TAKE these medications       busPIRone 10 MG tablet  Commonly known as:  BUSPAR  Take 10 mg by mouth 3 (three) times daily.     carbidopa-levodopa 25-100 MG per tablet  Commonly known as:  SINEMET IR  Take 1 tablet by mouth 3 (three) times daily.     carvedilol 3.125 MG tablet  Commonly known as:  COREG  Take 3.125 mg by mouth 2 (two) times daily with a meal.     cephALEXin 500 MG capsule  Commonly known as:  KEFLEX  Take 1 capsule (500 mg total) by mouth 2 (two) times daily. X 5 days     cetirizine 10 MG tablet  Commonly known as:  ZYRTEC  Take 10 mg by mouth daily as needed for allergies.     cholecalciferol 1000 UNITS tablet  Commonly known as:  VITAMIN D  Take 1,000 Units by mouth daily.     clopidogrel 75 MG tablet  Commonly known as:  PLAVIX  Take 75 mg by mouth daily.     Cranberry 425 MG Caps   Take 1 capsule by mouth daily.     digoxin 0.125 MG tablet  Commonly known as:  LANOXIN  Take 125 mcg by mouth every other day.     docusate sodium 100 MG capsule  Commonly known as:  COLACE  Take 100 mg by mouth 2 (two) times daily as needed for mild constipation.     DOUCHE VINEGAR/WATER VA  Place vaginally. Place one tablespoon of vinegar into 8oz of warm water and wash perium once weekly for UTI prevention     glimepiride 1 MG tablet  Commonly known as:  AMARYL  Take 1 mg by mouth daily before breakfast.     nitroGLYCERIN 0.4 MG SL tablet  Commonly known as:  NITROSTAT  Place 0.4 mg under the tongue every 5 (five) minutes as needed. For chest pain     OLANZapine 5 MG tablet  Commonly known as:  ZYPREXA  Take 5 mg by mouth at bedtime.     polyethylene glycol packet  Commonly known as:  MIRALAX / GLYCOLAX  Take 17 g by mouth daily.     PRESCRIPTION MEDICATION  Apply 30 mLs topically every 8 (eight) hours as needed (for nausea/agitation). *ABH nausea gel (ativan, benadryl, haldol)*  QUEtiapine 25 MG tablet  Commonly known as:  SEROQUEL  Take 25 mg by mouth every morning.     sitaGLIPtin 50 MG tablet  Commonly known as:  JANUVIA  Take 50 mg by mouth daily.     traMADol 50 MG tablet  Commonly known as:  ULTRAM  Take 50 mg by mouth every 6 (six) hours as needed for moderate pain.         Brief H and P: For complete details please refer to admission H and P, but in briefPatient is a 80 year old female from [place] care dementia unit with history of advanced dementia and Parkinson's disease, hypertension, atrial fibrillation, coronary disease, hyperlipidemia was brought from the facility for unresponsive episode. Patient is unable to give any history oral and kept mumbling to herself. Apparently patient's daughter was at the facility on the morning of admission and feeding the patient had breakfast when she became unresponsive, slumped over and began to drool. There  was no seizure activity documented. EMS was called who felt patient had left facial drooping and left grip weaker. Patient was brought to the ER as code stroke, patient was seen by neurology, Dr. Doy Mince who did not feel patient was a TPA candidate and do to poor functional status and advanced dementia, stroke workup would likely not be beneficial to the patient. Patient's daughter did not want any further invasive therapies. She was not considered anti-coagulation candidate secondary to advanced dementia and fall risk.  Patient was noted to have mild acute kidney injury with creatinine of 1.7 with BUN of 26, baseline 0.9 last year. WBC is 24.0 with lymphocytosis. Due to mild acute kidney injury and possibility of infection causing worsening of her mental status patient was admitted for observation overnight  Hospital Course:  Patient is a 80 year old female with history of advanced dementia and Parkinson disease who was admitted after unresponsive episode come acute encephalopathy likely due to acute UTI. She was also found to have acute renal insufficiency possibly due to UTI and dehydration and Lasix. Acute kidney injury likely due to dehydration Lasix was held and UA was obtained which showed many bacteria, WBCs 3-6, trace leukocytes, patient was complaining of dysuria to her daughter. Patient was provided gentle hydration and IV Rocephin. Creatinine was 1.7 at the time of admission, improved to 1.3. Continue to hold Lasix  Mild UTI - Please followup on the urine culture and sensitivities, was placed on IV Rocephin on admission, urine culture and sensitivities of the, patient was discharged on oral keflex.  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 She has seen Dr. Alen Blew in the past. I discussed in detail with Dr. Julien Nordmann from oncology service who reviewed prior documentation from the records and concurred that patient has a diagnosis of chronic lymphocytic leukemia in her records and does not need any further oncological management/intervention at this time.  acute encephalopathy with history of Dementia with Parkinson disease with behavioral issues  - continue Sinemet, Seroquel, Zyprexa, BuSpar Continue fall precautions, PT OT, follow EKG for QTC   TIA (transient ischemic attack) - Continue Plavix, no further stroke workup per neuro recommendations after discussion with the family.  Serial Neurochecks Were done.   Day of Discharge BP  116/68  Pulse 61  Temp(Src) 97.4 F (36.3 C) (Oral)  Resp 18  Ht 5' 2.99" (1.6 m)  Wt 46.63 kg (102 lb 12.8 oz)  BMI 18.21 kg/m2  SpO2 98%  Physical Exam: General: Alert and awake  confused, at baseline mental status, confirmed by daughter at bed side CVS: S1-S2 clear no murmur rubs or gallops Chest: clear to auscultation bilaterally, no wheezing rales or rhonchi Abdomen: soft nontender, nondistended, normal bowel sounds Extremities: no cyanosis, clubbing or edema noted bilaterally   The results of significant diagnostics from this hospitalization (including imaging, microbiology, ancillary and laboratory) are listed below for reference.    LAB RESULTS: Basic Metabolic Panel:  Recent Labs Lab 08/03/13 0914 08/03/13 0924 08/03/13 1730 08/04/13 0435  NA 143 142  --  143  K 4.3 4.0  --  4.0  CL 101 99  --  102  CO2 30  --   --  28  GLUCOSE 130* 130*  --  60*  BUN 27* 26*  --  24*  CREATININE 1.43* 1.70* 1.38* 1.34*  CALCIUM 9.2  --   --  9.1   Liver Function Tests:  Recent Labs Lab 08/03/13 0914  AST 18  ALT <5  ALKPHOS 99  BILITOT 0.2*  PROT 7.2  ALBUMIN 3.8   No results found for this basename:  LIPASE, AMYLASE,  in the last 168 hours No results found for this basename: AMMONIA,  in the last 168 hours CBC:  Recent Labs Lab 08/03/13 0914  08/03/13 1730 08/04/13 0435  WBC 24.0*  --  20.5* 25.6*  NEUTROABS 4.1  --   --   --   HGB 12.1  < > 12.2 11.5*  HCT 36.6  < > 36.3 34.4*  MCV 84.7  --  84.8 85.4  PLT 168  --  172 179  < > = values in this interval not displayed. Cardiac Enzymes:  Recent Labs Lab 08/03/13 0914  TROPONINI <0.30   BNP: No components found with this basename: POCBNP,  CBG:  Recent Labs Lab 08/03/13 1701 08/04/13 0819  GLUCAP 70 72    Significant Diagnostic Studies:  Ct Head Wo Contrast  08/03/2013   CLINICAL DATA:  Left facial droop  EXAM: CT HEAD WITHOUT CONTRAST  TECHNIQUE: Contiguous axial images were obtained from the base of the skull through the vertex without intravenous contrast.  COMPARISON:  CT HEAD W/O CM dated 07/11/2013; CT HEAD W/O CM dated 02/13/2013  FINDINGS: No hemorrhage or extra-axial fluid. Diffuse atrophy with mild white matter low attenuation. No evidence of focal low attenuation to suggest infarct. Calvarium is intact.  IMPRESSION: Chronic involutional change with no acute findings.  Critical Value/emergent results were called by telephone at the time of interpretation on 08/03/2013 at 9:34 AM to Dr. Doy Mince , who verbally acknowledged these results.   Electronically Signed   By: Skipper Cliche M.D.   On: 08/03/2013 09:34     Disposition and Follow-up:     Discharge Orders   Future Appointments Provider Department Dept Phone   09/02/2013 9:00 AM Kathrynn Ducking, MD [facility] [phone]   Future Orders Complete By Expires   Diet Carb Modified  As directed    Discharge instructions  As directed    Comments:     Please follow urine cultures, blood cultures done here on 08/03/13   Increase activity slowly  As directed        DISPOSITION: Skilled nursing facility  DIET Carb modified  diet   TESTS THAT NEED FOLLOW-UP Urine cultures, blood cultures  DISCHARGE FOLLOW-UP Follow-up Information   Follow up with CARR, Keene Breath, MD. Schedule an appointment as soon as possible for a visit in 1 week. (for hospital follow-up, please check on blood and urine culture sensitivity )    Specialty:  Nurse Practitioner   Contact information:   [address] [place] [address] [phone]       Time spent on Discharge: 40 mins  Signed:   Emerlyn Mehlhoff M.D. [facility] 08/04/2013, 11:36 AM Pager: [phone]

## 2013-08-09 LAB — CULTURE, BLOOD (ROUTINE X 2)
CULTURE: NO GROWTH
CULTURE: NO GROWTH

## 2013-09-02 ENCOUNTER — Encounter (INDEPENDENT_AMBULATORY_CARE_PROVIDER_SITE_OTHER): Payer: Self-pay

## 2013-09-02 ENCOUNTER — Encounter: Payer: Self-pay | Admitting: Neurology

## 2013-09-02 ENCOUNTER — Ambulatory Visit (INDEPENDENT_AMBULATORY_CARE_PROVIDER_SITE_OTHER): Payer: Medicare PPO | Admitting: Neurology

## 2013-09-02 VITALS — BP 138/69 | HR 70

## 2013-09-02 DIAGNOSIS — F0390 Unspecified dementia without behavioral disturbance: Secondary | ICD-10-CM

## 2013-09-02 DIAGNOSIS — G20 Parkinson's disease: Secondary | ICD-10-CM

## 2013-09-02 NOTE — Patient Instructions (Signed)
Parkinson Disease Parkinson disease is a disorder of the brain and spinal cord (central nervous system). The person will slowly lose the ability to control his or her body movements. This happens due to:  Damaged nerve cells.  Low levels of a certain brain chemical. HOME CARE  Exercise often.  Make time to rest during the day.  Take all medicine as told by your doctor.  Replace buttons and zippers with elastic and Velcro if getting dressed is difficult.  Put grab bars or rails in your home. This helps you to not fall.  Go to speech therapy or therapy to help you with daily activities (occupational therapy). Do this as told by your doctor.  Keep all doctor visits as told. GET HELP IF:  Your medicine does not help your symptoms.  You fall.  You have trouble swallowing or choke on your food. MAKE SURE YOU:  Understand these instructions.  Will watch your condition.  Will get help right away if you are not doing well or get worse. Document Released: 08/27/2011 Document Revised: 09/29/2012 Document Reviewed: 08/27/2011 [employer] Patient Information 2014 [employer].  

## 2013-09-02 NOTE — Progress Notes (Signed)
Reason for visit: Parkinson's disease  April Baker is an 80 y.o. female  History of present illness:  April Baker is a 80 year old right-handed white female with a history of Parkinson's disease and dementia. The patient recently was in the hospital on 08/03/2013. The patient was noted to have some alteration in renal function, and a mild bladder infection with altered mental status. The patient had passed out on the day of admission. The patient was on Sinemet taking 25/100 mg tablets 3 times daily upon discharge from the hospital, but her current MAR does not list Sinemet as a medication. It is not clear when the medication was discontinued. The patient stays awake at night, and she sleeps during the day. The patient has been placed on Zyprexa taking 5 mg at night, and she is getting 25 mg of Seroquel in the morning. The patient is unable to ambulate essentially. The patient will occasionally fall out of bed. The patient returns to this office for an evaluation. The daughter will be talking to hospice today regarding care for this patient.  Past Medical History  Diagnosis Date  . Hypertension   . Atrial fibrillation   . Parkinson's disease   . Myocardial infarction   . Coronary artery disease   . Hyperlipidemia   . Dementia   . Diabetes mellitus   . Anxiety   . MVP (mitral valve prolapse)     History of  . Unilateral carotid artery stenosis   . Wrist fracture, right   . Peripheral neuropathy   . Lower extremity numbness   . Mitral valve regurgitation   . Leukocytosis   . Dementia   . Gait disorder   . CLL (chronic lymphocytic leukemia)   . CLL (chronic lymphocytic leukemia) 08/04/2013    Past Surgical History  Procedure Laterality Date  . Inner ear surgery  1985    with metal placement-no mri  . Cesarean section  1964  . Wrist reconstruction  1991  . Anal fistulotomy  08/12/2000  . Coronary artery bypass graft    . Cardiac catheterization    . Sternal wire removal   10/29/2003    I&D of 2 upper sternal wires  . Coronary angioplasty with stent placement      Family History  Problem Relation Age of Onset  . Heart disease Mother   . Other Father 27    Bowel Obstruction  . Diabetes Maternal Grandmother     Social history:  reports that she has never smoked. She has never used smokeless tobacco. She reports that she does not drink alcohol or use illicit drugs.    Allergies  Allergen Reactions  . Zoloft [Sertraline Hcl]   . Latex Itching    Medications:  Current Outpatient Prescriptions on File Prior to Visit  Medication Sig Dispense Refill  . Acetic Acid (DOUCHE VINEGAR/WATER VA) Place vaginally. Place one tablespoon of vinegar into 8oz of warm water and wash perium once weekly for UTI prevention      . busPIRone (BUSPAR) 10 MG tablet Take 10 mg by mouth 3 (three) times daily.      . carbidopa-levodopa (SINEMET IR) 25-100 MG per tablet Take 0.5 tablets by mouth 3 (three) times daily.       . carvedilol (COREG) 3.125 MG tablet Take 3.125 mg by mouth 2 (two) times daily with a meal.      . cetirizine (ZYRTEC) 10 MG tablet Take 10 mg by mouth daily as needed for allergies.      Marland Kitchen  cholecalciferol (VITAMIN D) 1000 UNITS tablet Take 1,000 Units by mouth daily.       . clopidogrel (PLAVIX) 75 MG tablet Take 75 mg by mouth daily.      . Cranberry 425 MG CAPS Take 1 capsule by mouth daily.      . digoxin (LANOXIN) 0.125 MG tablet Take 125 mcg by mouth every other day.       . docusate sodium (COLACE) 100 MG capsule Take 100 mg by mouth 2 (two) times daily as needed for mild constipation.      . nitroGLYCERIN (NITROSTAT) 0.4 MG SL tablet Place 0.4 mg under the tongue every 5 (five) minutes as needed. For chest pain      . polyethylene glycol (MIRALAX / GLYCOLAX) packet Take 17 g by mouth daily.        Marland Kitchen PRESCRIPTION MEDICATION Apply 30 mLs topically every 8 (eight) hours as needed (for nausea/agitation). *ABH nausea gel (ativan, benadryl, haldol)*      .  QUEtiapine (SEROQUEL) 25 MG tablet Take 25 mg by mouth at bedtime.       . traMADol (ULTRAM) 50 MG tablet Take 50 mg by mouth every 6 (six) hours as needed for moderate pain.       No current facility-administered medications on file prior to visit.    ROS:  Out of a complete 14 system review of symptoms, the patient complains only of the following symptoms, and all other reviewed systems are negative.  Decreased activity level, fatigue Runny nose Light sensitivity Leg swelling Constipation Insomnia, frequent waking, daytime sleepiness, snoring Joint pain, achy muscles, walking difficulties Bruising easily Speech difficulty, weakness, passing out Agitation, behavior problem, confusion, anxiety, hallucinations  Blood pressure 138/69, pulse 70, weight 0 lb (0 kg).  Physical Exam  General: The patient is lethargic, difficult to arouse at the time of examination. The patient will cooperate with the examination.  Skin: 2-3+ edema below the knees is seen bilaterally.   Neurologic Exam  Mental status: The patient is quite lethargic, difficult to arouse, oriented to person, place, not date.  Cranial nerves: Facial symmetry is present. Speech is normal, no aphasia or dysarthria is noted. Extraocular movements are full. Visual fields are full.  Motor: The patient has good strength in all 4 extremities.  Sensory examination: Soft touch sensation is decreased on the left foot, symmetric in arms and face.  Coordination: The patient has good finger-nose-finger and heel-to-shin bilaterally.  Gait and station: The patient requires assistance for standing, but once up, she is unable to stand by herself, will lean backwards. The patient could not be ambulated. The patient is wheelchair-bound.  Reflexes: Deep tendon reflexes are symmetric.   Assessment/Plan:  1. Parkinson's disease  2. Dementia  3. Gait disturbance  The patient is staying awake at night, sleeping during the day.  The patient has been taken off of Sinemet, and placed on Zyprexa. Zyprexa may go against the Parkinson's disease. The patient needs to be on some dose of Sinemet to ensure that she is able to swallow, but the patient is not ambulatory. Sinemet will be restarted taking one half of a 25/100 mg tablet 3 times daily. Zyprexa will be discontinued, and the Seroquel will be moved from a morning dose to the evening. Seroquel is quite sedating, and should not be given during the daytime. The patient will followup in 3-4 months.  Jill Alexanders MD 09/02/2013 11:29 AM  [facility] [address] [place], [place] [address]  Phone [phone] Fax [phone]  

## 2013-10-05 ENCOUNTER — Other Ambulatory Visit: Payer: Self-pay | Admitting: Neurology

## 2013-10-14 ENCOUNTER — Emergency Department (HOSPITAL_COMMUNITY)
Admission: EM | Admit: 2013-10-14 | Discharge: 2013-10-14 | Disposition: A | Discharge status: Home / Self Care | Attending: Emergency Medicine | Admitting: Emergency Medicine

## 2013-10-14 ENCOUNTER — Encounter (HOSPITAL_COMMUNITY): Payer: Self-pay | Admitting: Emergency Medicine

## 2013-10-14 ENCOUNTER — Emergency Department (HOSPITAL_COMMUNITY)

## 2013-10-14 DIAGNOSIS — I10 Essential (primary) hypertension: Secondary | ICD-10-CM | POA: Diagnosis not present

## 2013-10-14 DIAGNOSIS — F0390 Unspecified dementia without behavioral disturbance: Secondary | ICD-10-CM | POA: Diagnosis not present

## 2013-10-14 DIAGNOSIS — Z856 Personal history of leukemia: Secondary | ICD-10-CM | POA: Diagnosis not present

## 2013-10-14 DIAGNOSIS — I2510 Atherosclerotic heart disease of native coronary artery without angina pectoris: Secondary | ICD-10-CM | POA: Diagnosis not present

## 2013-10-14 DIAGNOSIS — Z91040 Latex allergy status: Secondary | ICD-10-CM | POA: Diagnosis not present

## 2013-10-14 DIAGNOSIS — S139XXA Sprain of joints and ligaments of unspecified parts of neck, initial encounter: Secondary | ICD-10-CM | POA: Diagnosis not present

## 2013-10-14 DIAGNOSIS — Z79899 Other long term (current) drug therapy: Secondary | ICD-10-CM | POA: Diagnosis not present

## 2013-10-14 DIAGNOSIS — F411 Generalized anxiety disorder: Secondary | ICD-10-CM | POA: Diagnosis not present

## 2013-10-14 DIAGNOSIS — Y939 Activity, unspecified: Secondary | ICD-10-CM | POA: Insufficient documentation

## 2013-10-14 DIAGNOSIS — Z951 Presence of aortocoronary bypass graft: Secondary | ICD-10-CM | POA: Diagnosis not present

## 2013-10-14 DIAGNOSIS — S93401A Sprain of unspecified ligament of right ankle, initial encounter: Secondary | ICD-10-CM

## 2013-10-14 DIAGNOSIS — Z8781 Personal history of (healed) traumatic fracture: Secondary | ICD-10-CM | POA: Diagnosis not present

## 2013-10-14 DIAGNOSIS — I4891 Unspecified atrial fibrillation: Secondary | ICD-10-CM | POA: Diagnosis not present

## 2013-10-14 DIAGNOSIS — Z23 Encounter for immunization: Secondary | ICD-10-CM | POA: Insufficient documentation

## 2013-10-14 DIAGNOSIS — IMO0002 Reserved for concepts with insufficient information to code with codable children: Secondary | ICD-10-CM | POA: Insufficient documentation

## 2013-10-14 DIAGNOSIS — S0003XA Contusion of scalp, initial encounter: Secondary | ICD-10-CM | POA: Diagnosis not present

## 2013-10-14 DIAGNOSIS — Y9210 Unspecified residential institution as the place of occurrence of the external cause: Secondary | ICD-10-CM | POA: Insufficient documentation

## 2013-10-14 DIAGNOSIS — E119 Type 2 diabetes mellitus without complications: Secondary | ICD-10-CM | POA: Diagnosis not present

## 2013-10-14 DIAGNOSIS — Z9889 Other specified postprocedural states: Secondary | ICD-10-CM | POA: Diagnosis not present

## 2013-10-14 DIAGNOSIS — Z7902 Long term (current) use of antithrombotics/antiplatelets: Secondary | ICD-10-CM | POA: Diagnosis not present

## 2013-10-14 DIAGNOSIS — W06XXXA Fall from bed, initial encounter: Secondary | ICD-10-CM | POA: Diagnosis not present

## 2013-10-14 DIAGNOSIS — I252 Old myocardial infarction: Secondary | ICD-10-CM | POA: Diagnosis not present

## 2013-10-14 DIAGNOSIS — S161XXA Strain of muscle, fascia and tendon at neck level, initial encounter: Secondary | ICD-10-CM

## 2013-10-14 DIAGNOSIS — S1093XA Contusion of unspecified part of neck, initial encounter: Secondary | ICD-10-CM

## 2013-10-14 DIAGNOSIS — W19XXXA Unspecified fall, initial encounter: Secondary | ICD-10-CM

## 2013-10-14 DIAGNOSIS — G20 Parkinson's disease: Secondary | ICD-10-CM | POA: Diagnosis not present

## 2013-10-14 DIAGNOSIS — Z862 Personal history of diseases of the blood and blood-forming organs and certain disorders involving the immune mechanism: Secondary | ICD-10-CM | POA: Diagnosis not present

## 2013-10-14 DIAGNOSIS — S0990XA Unspecified injury of head, initial encounter: Secondary | ICD-10-CM | POA: Insufficient documentation

## 2013-10-14 DIAGNOSIS — T07XXXA Unspecified multiple injuries, initial encounter: Secondary | ICD-10-CM

## 2013-10-14 DIAGNOSIS — S93409A Sprain of unspecified ligament of unspecified ankle, initial encounter: Secondary | ICD-10-CM | POA: Insufficient documentation

## 2013-10-14 DIAGNOSIS — S8391XA Sprain of unspecified site of right knee, initial encounter: Secondary | ICD-10-CM

## 2013-10-14 DIAGNOSIS — G20A1 Parkinson's disease without dyskinesia, without mention of fluctuations: Secondary | ICD-10-CM | POA: Insufficient documentation

## 2013-10-14 DIAGNOSIS — S0083XA Contusion of other part of head, initial encounter: Secondary | ICD-10-CM | POA: Insufficient documentation

## 2013-10-14 MED ORDER — TETANUS-DIPHTH-ACELL PERTUSSIS 5-2.5-18.5 LF-MCG/0.5 IM SUSP
0.5000 mL | Freq: Once | INTRAMUSCULAR | Status: AC
  Administered 2013-10-14: 0.5 mL via INTRAMUSCULAR
  Filled 2013-10-14: qty 0.5

## 2013-10-14 NOTE — ED Notes (Signed)
Patient taken back to [facility].

## 2013-10-14 NOTE — Discharge Instructions (Signed)
Abrasion An abrasion is a cut or scrape of the skin. Abrasions do not extend through all layers of the skin and most heal within 10 days. It is important to care for your abrasion properly to prevent infection. CAUSES  Most abrasions are caused by falling on, or gliding across, the ground or other surface. When your skin rubs on something, the outer and inner layer of skin rubs off, causing an abrasion. DIAGNOSIS  Your caregiver will be able to diagnose an abrasion during a physical exam.  TREATMENT  Your treatment depends on how large and deep the abrasion is. Generally, your abrasion will be cleaned with water and a mild soap to remove any dirt or debris. An antibiotic ointment may be put over the abrasion to prevent an infection. A bandage (dressing) may be wrapped around the abrasion to keep it from getting dirty.  You may need a tetanus shot if:  You cannot remember when you had your last tetanus shot.  You have never had a tetanus shot.  The injury broke your skin. If you get a tetanus shot, your arm may swell, get red, and feel warm to the touch. This is common and not a problem. If you need a tetanus shot and you choose not to have one, there is a rare chance of getting tetanus. Sickness from tetanus can be serious.  HOME CARE INSTRUCTIONS   If a dressing was applied, change it at least once a day or as directed by your caregiver. If the bandage sticks, soak it off with warm water.   Wash the area with water and a mild soap to remove all the ointment 2 times a day. Rinse off the soap and pat the area dry with a clean towel.   Reapply any ointment as directed by your caregiver. This will help prevent infection and keep the bandage from sticking. Use gauze over the wound and under the dressing to help keep the bandage from sticking.   Change your dressing right away if it becomes wet or dirty.   Only take over-the-counter or prescription medicines for pain, discomfort, or fever as  directed by your caregiver.   Follow up with your caregiver within 24 48 hours for a wound check, or as directed. If you were not given a wound-check appointment, look closely at your abrasion for redness, swelling, or pus. These are signs of infection. SEEK IMMEDIATE MEDICAL CARE IF:   You have increasing pain in the wound.   You have redness, swelling, or tenderness around the wound.   You have pus coming from the wound.   You have a fever or persistent symptoms for more than 2 3 days.  You have a fever and your symptoms suddenly get worse.  You have a bad smell coming from the wound or dressing.  MAKE SURE YOU:   Understand these instructions.  Will watch your condition.  Will get help right away if you are not doing well or get worse. Document Released: 03/14/2005 Document Revised: 05/21/2012 Document Reviewed: 05/08/2011 [facility] Patient Information 2014 [place], [place].  You have had a head injury which does not appear to require admission at this time. A concussion is a state of changed mental ability from trauma.  SEEK IMMEDIATE MEDICAL ATTENTION IF: There is confusion or drowsiness (although children frequently become drowsy after injury).  You cannot awaken the injured person.  There is nausea (feeling sick to your stomach) or continued, forceful vomiting.  You notice dizziness or unsteadiness which  is getting worse, or inability to walk.  You have convulsions or unconsciousness.  You experience severe, persistent headaches not relieved by Tylenol. (Do not take aspirin as this impairs clotting abilities). Take other pain medications only as directed.  You cannot use arms or legs normally.  There are changes in pupil sizes. (This is the black center in the colored part of the eye)  There is clear or bloody discharge from the nose or ears.  Change in speech, vision, swallowing, or understanding.  Localized weakness, numbness, tingling, or change in bowel or bladder  control.

## 2013-10-14 NOTE — ED Notes (Signed)
Dr Christy Gentles in with patient.  Patient does have multiple skin tears, old in nature.

## 2013-10-14 NOTE — ED Provider Notes (Signed)
CSN: [id number]     Arrival date & time 10/14/13  0304 History   First MD Initiated Contact with Patient 10/14/13 0451     Chief Complaint  Patient presents with  . Fall      Patient is a 80 y.o. female presenting with fall. The history is provided by the patient and the nursing home. The history is limited by the condition of the patient.  Fall This is a new problem. Episode onset: just prior to arrival. The problem occurs constantly. The problem has not changed since onset.Associated symptoms include headaches. Nothing aggravates the symptoms.  pt presents from nursing facility  She fell from bed Per staff at facility ([facility]) staff heard patient fall and was found in floor She was not unconscious She was as her baseline per staff Pt had been in bed and likely fell out of bed though not witnessed Pt has h/o frequent falls  Pt currently reports headache, right ankle pain and right knee pain  Pt has h/o dementia and is a DNR  Past Medical History  Diagnosis Date  . Hypertension   . Atrial fibrillation   . Parkinson's disease   . Myocardial infarction   . Coronary artery disease   . Hyperlipidemia   . Dementia   . Diabetes mellitus   . Anxiety   . MVP (mitral valve prolapse)     History of  . Unilateral carotid artery stenosis   . Wrist fracture, right   . Peripheral neuropathy   . Lower extremity numbness   . Mitral valve regurgitation   . Leukocytosis   . Dementia   . Gait disorder   . CLL (chronic lymphocytic leukemia)   . CLL (chronic lymphocytic leukemia) 08/04/2013   Past Surgical History  Procedure Laterality Date  . Inner ear surgery  1985    with metal placement-no mri  . Cesarean section  1964  . Wrist reconstruction  1991  . Anal fistulotomy  08/12/2000  . Coronary artery bypass graft    . Cardiac catheterization    . Sternal wire removal  10/29/2003    I&D of 2 upper sternal wires  . Coronary angioplasty with stent placement      Family History  Problem Relation Age of Onset  . Heart disease Mother   . Other Father 68    Bowel Obstruction  . Diabetes Maternal Grandmother    History  Substance Use Topics  . Smoking status: Never Smoker   . Smokeless tobacco: Never Used  . Alcohol Use: No   OB History   Grav Para Term Preterm Abortions TAB SAB Ect Mult Living                 Review of Systems  Unable to perform ROS: Dementia  Neurological: Positive for headaches.      Allergies  Zoloft and Latex  Home Medications   Prior to Admission medications   Medication Sig Start Date End Date Taking? Authorizing Provider  Acetic Acid (DOUCHE VINEGAR/WATER VA) Place vaginally. Place one tablespoon of vinegar into 8oz of warm water and wash perium once weekly for UTI prevention   Yes Historical Provider, MD  busPIRone (BUSPAR) 10 MG tablet Take 10 mg by mouth 3 (three) times daily.   Yes Historical Provider, MD  carbidopa-levodopa (SINEMET IR) 25-100 MG per tablet Take 0.5 tablets by mouth 3 (three) times daily.   Yes Historical Provider, MD  carvedilol (COREG) 3.125 MG tablet Take 3.125 mg by  mouth 2 (two) times daily with a meal.   Yes Historical Provider, MD  cetirizine (ZYRTEC) 10 MG tablet Take 10 mg by mouth daily as needed for allergies.   Yes Historical Provider, MD  clopidogrel (PLAVIX) 75 MG tablet Take 75 mg by mouth daily.   Yes Historical Provider, MD  Cranberry 425 MG CAPS Take 1 capsule by mouth daily.   Yes Historical Provider, MD  digoxin (LANOXIN) 0.125 MG tablet Take 125 mcg by mouth every other day.    Yes Historical Provider, MD  docusate sodium (COLACE) 100 MG capsule Take 100 mg by mouth 2 (two) times daily as needed for mild constipation.   Yes Historical Provider, MD  furosemide (LASIX) 40 MG tablet Take 40 mg by mouth daily.   Yes Historical Provider, MD  Melatonin 3 MG TABS Take 1 tablet by mouth at bedtime.   Yes Historical Provider, MD  nitroGLYCERIN (NITROSTAT) 0.4 MG SL tablet  Place 0.4 mg under the tongue every 5 (five) minutes as needed. For chest pain   Yes Historical Provider, MD  polyethylene glycol (MIRALAX / GLYCOLAX) packet Take 17 g by mouth daily.     Yes Historical Provider, MD  potassium chloride (K-DUR,KLOR-CON) 10 MEQ tablet Take 10 mEq by mouth daily.   Yes Historical Provider, MD  PRESCRIPTION MEDICATION Apply 30 mLs topically every 8 (eight) hours as needed (for nausea/agitation). *ABH nausea gel (ativan, benadryl, haldol)*   Yes Historical Provider, MD  QUEtiapine (SEROQUEL) 25 MG tablet Take 25 mg by mouth 2 (two) times daily.    Yes Historical Provider, MD  Skin Protectants, Misc. (BAZA PROTECT EX) Apply topically daily as needed (to red areas).   Yes Historical Provider, MD  traMADol (ULTRAM) 50 MG tablet Take 50 mg by mouth every 6 (six) hours as needed for moderate pain.   Yes Historical Provider, MD   BP 117/76  Pulse 64  Temp(Src) 97.9 F (36.6 C) (Oral)  Resp 16  SpO2 98% Physical Exam CONSTITUTIONAL: elderly, frail HEAD: forehead hematoma noetd.  EYES: EOMI ENMT: Mucous membranes moist, no dental injury.  No nasal deformity No septal hematoma. Abrasion to bridge of nose noted NECK: supple no meningeal signs SPINE:entire spine nontender CV: S1/S2 noted, murmur noted LUNGS: Lungs are clear to auscultation bilaterally Chest - no tenderness to palpation ABDOMEN: soft NEURO: Pt is awake/alert, moves all extremitiesx4. She is pleasantly confused.   EXTREMITIES: pulses normal, full ROM. Pelvis stable.  Tenderness to right knee and ankle.  Scattered abrasions in various stages of healing to all extremities.  No lacerations or active bleeding noted SKIN: warm, color normal, various abrasions noted   ED Course  Procedures   Imaging Review Dg Ankle Complete Right  10/14/2013   CLINICAL DATA:  Right ankle pain.  Status post fall.  EXAM: RIGHT ANKLE - COMPLETE 3+ VIEW  COMPARISON:  Right tibia/fibula radiographs performed 02/22/2009   FINDINGS: There is no evidence of fracture or dislocation. The ankle mortise is intact; the interosseous space is within normal limits. No talar tilt or subluxation is seen.  The joint spaces are preserved. Diffuse soft tissue swelling is noted about the ankle. Scattered vascular clips are seen along the medial aspect of the lower leg.  IMPRESSION: No evidence of fracture or dislocation.   Electronically Signed   By: Garald Balding M.D.   On: 10/14/2013 05:50   Ct Head Wo Contrast  10/14/2013   CLINICAL DATA:  [place] out of bed, dementia, forehead hematoma.  EXAM:  CT HEAD WITHOUT CONTRAST  CT CERVICAL SPINE WITHOUT CONTRAST  TECHNIQUE: Multidetector CT imaging of the head and cervical spine was performed following the standard protocol without intravenous contrast. Multiplanar CT image reconstructions of the cervical spine were also generated.  COMPARISON:  CT HEAD W/O CM dated 08/03/2013; CT HEAD W/O CM dated 07/11/2013; CT C SPINE W/O CM dated 08/09/2012; CT HEAD W/O CM dated 02/13/2013; CT HEAD W/O CM dated 06/24/2012; CT C SPINE W/O CM dated 06/24/2012  FINDINGS: CT HEAD FINDINGS  The ventricles and sulci are normal for age. No intraparenchymal hemorrhage, mass effect nor midline shift. Patchy supratentorial white matter hypodensities are within normal range for patient's age and though non-specific suggest sequelae of chronic small vessel ischemic disease. No acute large vascular territory infarcts.  No abnormal extra-axial fluid collections. Basal cisterns are patent. Moderate calcific atherosclerosis of the carotid siphons.  Moderate left frontal scalp hematoma without subcutaneous gas or radiopaque foreign bodies. No skull fracture. The included ocular globes and orbital contents are non-suspicious. Trace paranasal sinus mucosal thickening without air-fluid levels. Moderate temporomandibular osteoarthrosis.  CT CERVICAL SPINE FINDINGS  Cervical vertebral bodies and posterior elements appear intact and aligned with  maintenance of the cervical lordosis. Severe C5-6 and C6-7 degenerative disc disease. Grade 1 C7-T1 anterolisthesis on degenerative basis. C1-2 articulation maintained with mild arthropathy. No destructive bony lesions. Severe bilateral carotid bifurcations calcific atherosclerosis. Prominent levels 5A/B lymph nodes seen, measuring up to 8 mm short axis without lymphadenopathy by CT size criteria. Stable right 13 x 7 mm thyroid nodule, no suspicious features, no further evaluation recommended.  Degenerative disc disease and facet arthropathy results and mild canal stenosis C5-6. Severe right C5-6 neural foraminal narrowing.  IMPRESSION: CT head: Moderate loss scalp hematoma without underlying skull fracture and no acute intracranial process.  CT cervical spine: No acute fracture. Grade 1 C7-T1 anterolisthesis on degenerative basis.   Electronically Signed   By: Elon Alas   On: 10/14/2013 05:49   Ct Cervical Spine Wo Contrast  10/14/2013   CLINICAL DATA:  [place] out of bed, dementia, forehead hematoma.  EXAM: CT HEAD WITHOUT CONTRAST  CT CERVICAL SPINE WITHOUT CONTRAST  TECHNIQUE: Multidetector CT imaging of the head and cervical spine was performed following the standard protocol without intravenous contrast. Multiplanar CT image reconstructions of the cervical spine were also generated.  COMPARISON:  CT HEAD W/O CM dated 08/03/2013; CT HEAD W/O CM dated 07/11/2013; CT C SPINE W/O CM dated 08/09/2012; CT HEAD W/O CM dated 02/13/2013; CT HEAD W/O CM dated 06/24/2012; CT C SPINE W/O CM dated 06/24/2012  FINDINGS: CT HEAD FINDINGS  The ventricles and sulci are normal for age. No intraparenchymal hemorrhage, mass effect nor midline shift. Patchy supratentorial white matter hypodensities are within normal range for patient's age and though non-specific suggest sequelae of chronic small vessel ischemic disease. No acute large vascular territory infarcts.  No abnormal extra-axial fluid collections. Basal cisterns are  patent. Moderate calcific atherosclerosis of the carotid siphons.  Moderate left frontal scalp hematoma without subcutaneous gas or radiopaque foreign bodies. No skull fracture. The included ocular globes and orbital contents are non-suspicious. Trace paranasal sinus mucosal thickening without air-fluid levels. Moderate temporomandibular osteoarthrosis.  CT CERVICAL SPINE FINDINGS  Cervical vertebral bodies and posterior elements appear intact and aligned with maintenance of the cervical lordosis. Severe C5-6 and C6-7 degenerative disc disease. Grade 1 C7-T1 anterolisthesis on degenerative basis. C1-2 articulation maintained with mild arthropathy. No destructive bony lesions. Severe bilateral carotid bifurcations calcific atherosclerosis.  Prominent levels 5A/B lymph nodes seen, measuring up to 8 mm short axis without lymphadenopathy by CT size criteria. Stable right 13 x 7 mm thyroid nodule, no suspicious features, no further evaluation recommended.  Degenerative disc disease and facet arthropathy results and mild canal stenosis C5-6. Severe right C5-6 neural foraminal narrowing.  IMPRESSION: CT head: Moderate loss scalp hematoma without underlying skull fracture and no acute intracranial process.  CT cervical spine: No acute fracture. Grade 1 C7-T1 anterolisthesis on degenerative basis.   Electronically Signed   By: Elon Alas   On: 10/14/2013 05:49   Dg Knee Complete 4 Views Right  10/14/2013   CLINICAL DATA:  Right knee pain.  Status post fall.  EXAM: RIGHT KNEE - COMPLETE 4+ VIEW  COMPARISON:  Right tibia/fibula radiographs performed 02/22/2009  FINDINGS: There is no evidence of fracture or dislocation. The joint spaces are preserved. No significant degenerative change is seen; the patellofemoral joint is grossly unremarkable in appearance.  No significant joint effusion is seen. Diffuse vascular calcifications are seen.  IMPRESSION: 1. No evidence of fracture or dislocation. 2. Diffuse vascular  calcifications seen.   Electronically Signed   By: Garald Balding M.D.   On: 10/14/2013 05:50    Pt stable She is essentially non-ambulatory at baseline Stable for d/c back to facility  MDM   Final diagnoses:  Fall  Minor head injury  Cephalohematoma  Neck strain  Right knee sprain  Right ankle sprain  Abrasions of multiple sites    Nursing notes including past medical history and social history reviewed and considered in documentation xrays reviewed and considered     Sharyon Cable, MD 10/14/13 [phone]

## 2013-10-14 NOTE — ED Notes (Signed)
Called report to [place], Med Tech at [facility].

## 2013-10-14 NOTE — ED Notes (Signed)
Patient to CT and xray.

## 2013-10-14 NOTE — ED Notes (Addendum)
Patient fell out of bed.  Patient is from the memory care center at [place].  Patient is C&A according to baseline per staff.  Patient is on Plavix.  Pupils are equal and reactive.  Patient does have tenderness of left wrist and hematoma and abrasion on forehead.  Patient does have history of Afib.  Patient states that her right knee is painful.

## 2013-10-14 NOTE — ED Notes (Signed)
[facility] called  

## 2013-11-05 ENCOUNTER — Telehealth: Payer: Self-pay | Admitting: Neurology

## 2013-11-05 NOTE — Telephone Encounter (Signed)
Patient's power of attorney calling to check with April Baker to see if everything that she faxed over has been received. Please return call and advise.

## 2013-11-10 DIAGNOSIS — Z0289 Encounter for other administrative examinations: Secondary | ICD-10-CM

## 2014-01-04 ENCOUNTER — Encounter (INDEPENDENT_AMBULATORY_CARE_PROVIDER_SITE_OTHER): Payer: Self-pay

## 2014-01-04 ENCOUNTER — Encounter: Payer: Self-pay | Admitting: Neurology

## 2014-01-04 ENCOUNTER — Ambulatory Visit (INDEPENDENT_AMBULATORY_CARE_PROVIDER_SITE_OTHER): Admitting: Neurology

## 2014-01-04 VITALS — BP 159/69 | HR 74

## 2014-01-04 DIAGNOSIS — R269 Unspecified abnormalities of gait and mobility: Secondary | ICD-10-CM

## 2014-01-04 DIAGNOSIS — G20 Parkinson's disease: Secondary | ICD-10-CM

## 2014-01-04 MED ORDER — QUETIAPINE FUMARATE 25 MG PO TABS: ORAL_TABLET | ORAL | Status: AC

## 2014-01-04 NOTE — Progress Notes (Signed)
Reason for visit: Parkinson's disease, dementia  April Baker is an 80 y.o. female  History of present illness:  April Baker is a 80 year old right-handed white female with a history of Parkinson's disease associated with dementia. The patient has eye opening apraxia. When last seen, she was having significant problems with drowsiness during the day. The dosing of the Seroquel was changed to nighttime, and she was taken off of Zyprexa. She has done better in this regard, and she is eating and drinking well. She is incontinent of bowel and bladder, and she wears adult diapers. The patient has fallen out of bed in April of 2015, and she required an ER visit. The patient had severe bruising about the face. She has had frequent urinary tract infections, but very little problems with this recently. She does have some agitation during the day. She returns to this office for an evaluation.  Past Medical History  Diagnosis Date  . Hypertension   . Atrial fibrillation   . Parkinson's disease   . Myocardial infarction   . Coronary artery disease   . Hyperlipidemia   . Dementia   . Diabetes mellitus   . Anxiety   . MVP (mitral valve prolapse)     History of  . Unilateral carotid artery stenosis   . Wrist fracture, right   . Peripheral neuropathy   . Lower extremity numbness   . Mitral valve regurgitation   . Leukocytosis   . Dementia   . Gait disorder   . CLL (chronic lymphocytic leukemia)   . CLL (chronic lymphocytic leukemia) 08/04/2013    Past Surgical History  Procedure Laterality Date  . Inner ear surgery  1985    with metal placement-no mri  . Cesarean section  1964  . Wrist reconstruction  1991  . Anal fistulotomy  08/12/2000  . Coronary artery bypass graft    . Cardiac catheterization    . Sternal wire removal  10/29/2003    I&D of 2 upper sternal wires  . Coronary angioplasty with stent placement      Family History  Problem Relation Age of Onset  . Heart disease  Mother   . Other Father 26    Bowel Obstruction  . Diabetes Maternal Grandmother     Social history:  reports that she has never smoked. She has never used smokeless tobacco. She reports that she does not drink alcohol or use illicit drugs.    Allergies  Allergen Reactions  . Zoloft [Sertraline Hcl]   . Latex Itching    Medications:  Current Outpatient Prescriptions on File Prior to Visit  Medication Sig Dispense Refill  . Acetic Acid (DOUCHE VINEGAR/WATER VA) Place vaginally. Place one tablespoon of vinegar into 8oz of warm water and wash perium once weekly for UTI prevention      . busPIRone (BUSPAR) 10 MG tablet Take 10 mg by mouth 2 (two) times daily.       . carbidopa-levodopa (SINEMET IR) 25-100 MG per tablet Take 0.5 tablets by mouth 3 (three) times daily.      . carvedilol (COREG) 3.125 MG tablet Take 3.125 mg by mouth 2 (two) times daily with a meal.      . cetirizine (ZYRTEC) 10 MG tablet Take 10 mg by mouth daily as needed for allergies.      Marland Kitchen clopidogrel (PLAVIX) 75 MG tablet Take 75 mg by mouth daily.      . Cranberry 425 MG CAPS Take 1 capsule by mouth  daily.      . docusate sodium (COLACE) 100 MG capsule Take 100 mg by mouth 2 (two) times daily as needed for mild constipation.      . furosemide (LASIX) 40 MG tablet Take 40 mg by mouth daily.      . nitroGLYCERIN (NITROSTAT) 0.4 MG SL tablet Place 0.4 mg under the tongue every 5 (five) minutes as needed. For chest pain      . polyethylene glycol (MIRALAX / GLYCOLAX) packet Take 17 g by mouth daily.        . potassium chloride (K-DUR,KLOR-CON) 10 MEQ tablet Take 10 mEq by mouth daily.      Marland Kitchen PRESCRIPTION MEDICATION Apply 30 mLs topically every 8 (eight) hours as needed (for nausea/agitation). *ABH nausea gel (ativan, benadryl, haldol)*      . Skin Protectants, Misc. (BAZA PROTECT EX) Apply topically daily as needed (to red areas).      . traMADol (ULTRAM) 50 MG tablet Take 50 mg by mouth every 6 (six) hours as needed for  moderate pain.      . Melatonin 3 MG TABS Take 1 tablet by mouth at bedtime.       No current facility-administered medications on file prior to visit.    ROS:  Out of a complete 14 system review of symptoms, the patient complains only of the following symptoms, and all other reviewed systems are negative.  Fatigue Runny nose Light sensitivity Ankle swelling Daytime sleepiness, snoring Frequency of urination Bruising easily Memory loss, weakness Agitation, confusion, decreased concentration, anxiety, hallucinations  Blood pressure 159/69, pulse 74, weight 0 lb (0 kg).  Physical Exam  General: The patient is alert and cooperative at the time of the examination.  Skin: 2+ edema seen in the ankles bilaterally.   Neurologic Exam  Mental status: The Mini-Mental status examination done today shows a total score of 9/30.  Cranial nerves: Facial symmetry is present. Speech is normal, no aphasia or dysarthria is noted. Extraocular movements are full. Visual fields are full. The patient does appear to have some eye opening apraxia.  Motor: The patient has good strength in all 4 extremities.  Sensory examination: Soft touch sensation is symmetric on the face, arms, and legs.  Coordination: The patient has good finger-nose-finger and heel-to-shin bilaterally. The patient has surprisingly little apraxia with the extremities.  Gait and station: The patient is nonambulatory.  Reflexes: Deep tendon reflexes are symmetric.   Assessment/Plan:  1. Parkinson's disease  2. Gait disorder  3. Dementia  The patient is relatively stable recently. She is being followed through hospice. At this point, the patient may followup through this office on an as-needed basis. No adjustments to the medication will be made at this point.   Jill Alexanders MD 01/04/2014 7:01 PM  [facility] [address] [place], [place] [address]  Phone [phone] Fax  [phone]

## 2014-01-04 NOTE — Patient Instructions (Signed)
Dementia Dementia is a general term for problems with brain function. A person with dementia has memory loss and a hard time with at least one other brain function such as thinking, speaking, or problem solving. Dementia can affect social functioning, how you do your job, your mood, or your personality. The changes may be hidden for a long time. The earliest forms of this disease are usually not detected by family or friends. Dementia can be:  Irreversible.  Potentially reversible.  Partially reversible.  Progressive. This means it can get worse over time. CAUSES  Irreversible dementia causes may include:  Degeneration of brain cells (Alzheimer's disease or lewy body dementia).  Multiple small strokes (vascular dementia).  Infection (chronic meningitis or Creutzfelt-Jakob disease).  Frontotemporal dementia. This affects younger people, age 40 to 70, compared to those who have Alzheimer's disease.  Dementia associated with other disorders like Parkinson's disease, Huntington's disease, or HIV-associated dementia. Potentially or partially reversible dementia causes may include:  Medicines.  Metabolic causes such as excessive alcohol intake, vitamin B12 deficiency, or thyroid disease.  Masses or pressure in the brain such as a tumor, blood clot, or hydrocephalus. SYMPTOMS  Symptoms are often hard to detect. Family members or coworkers may not notice them early in the disease process. Different people with dementia may have different symptoms. Symptoms can include:  A hard time with memory, especially recent memory. Long-term memory may not be impaired.  Asking the same question multiple times or forgetting something someone just said.  A hard time speaking your thoughts or finding certain words.  A hard time solving problems or performing familiar tasks (such as how to use a telephone).  Sudden changes in mood.  Changes in personality, especially increasing moodiness or  mistrust.  Depression.  A hard time understanding complex ideas that were never a problem in the past. DIAGNOSIS  There are no specific tests for dementia.   Your caregiver may recommend a thorough evaluation. This is because some forms of dementia can be reversible. The evaluation will likely include a physical exam and getting a detailed history from you and a family member. The history often gives the best clues and suggestions for a diagnosis.  Memory testing may be done. A detailed brain function evaluation called neuropsychologic testing may be helpful.  Lab tests and brain imaging (such as a CT scan or MRI scan) are sometimes important.  Sometimes observation and re-evaluation over time is very helpful. TREATMENT  Treatment depends on the cause.   If the problem is a vitamin deficiency, it may be helped or cured with supplements.  For dementias such as Alzheimer's disease, medicines are available to stabilize or slow the course of the disease. There are no cures for this type of dementia.  Your caregiver can help direct you to groups, organizations, and other caregivers to help with decisions in the care of you or your loved one. HOME CARE INSTRUCTIONS The care of individuals with dementia is varied and dependent upon the progression of the dementia. The following suggestions are intended for the person living with, or caring for, the person with dementia.  Create a safe environment.  Remove the locks on bathroom doors to prevent the person from accidentally locking himself or herself in.  Use childproof latches on kitchen cabinets and any place where cleaning supplies, chemicals, or alcohol are kept.  Use childproof covers in unused electrical outlets.  Install childproof devices to keep doors and windows secured.  Remove stove knobs or install safety   knobs and an automatic shut-off on the stove.  Lower the temperature on water heaters.  Label medicines and keep them  locked up.  Secure knives, lighters, matches, power tools, and guns, and keep these items out of reach.  Keep the house free from clutter. Remove rugs or anything that might contribute to a fall.  Remove objects that might break and hurt the person.  Make sure lighting is good, both inside and outside.  Install grab rails as needed.  Use a monitoring device to alert you to falls or other needs for help.  Reduce confusion.  Keep familiar objects and people around.  Use night lights or dim lights at night.  Label items or areas.  Use reminders, notes, or directions for daily activities or tasks.  Keep a simple, consistent routine for waking, meals, bathing, dressing, and bedtime.  Create a calm, quiet environment.  Place large clocks and calendars prominently.  Display emergency numbers and home address near all telephones.  Use cues to establish different times of the day. An example is to open curtains to let the natural light in during the day.   Use effective communication.  Choose simple words and short sentences.  Use a gentle, calm tone of voice.  Be careful not to interrupt.  If the person is struggling to find a word or communicate a thought, try to provide the word or thought.  Ask one question at a time. Allow the person ample time to answer questions. Repeat the question again if the person does not respond.  Reduce nighttime restlessness.  Provide a comfortable bed.  Have a consistent nighttime routine.  Ensure a regular walking or physical activity schedule. Involve the person in daily activities as much as possible.  Limit napping during the day.  Limit caffeine.  Attend social events that stimulate rather than overwhelm the senses.  Encourage good nutrition and hydration.  Reduce distractions during meal times and snacks.  Avoid foods that are too hot or too cold.  Monitor chewing and swallowing ability.  Continue with routine vision,  hearing, dental, and medical screenings.  Only give over-the-counter or prescription medicines as directed by the caregiver.  Monitor driving abilities. Do not allow the person to drive when safe driving is no longer possible.  Register with an identification program which could provide location assistance in the event of a missing person situation. SEEK MEDICAL CARE IF:   New behavioral problems start such as moodiness, aggressiveness, or seeing things that are not there (hallucinations).  Any new problem with brain function happens. This includes problems with balance, speech, or falling a lot.  Problems with swallowing develop.  Any symptoms of other illness happen. Small changes or worsening in any aspect of brain function can be a sign that the illness is getting worse. It can also be a sign of another medical illness such as infection. Seeing a caregiver right away is important. SEEK IMMEDIATE MEDICAL CARE IF:   A fever develops.  New or worsened confusion develops.  New or worsened sleepiness develops.  Staying awake becomes hard to do. Document Released: 11/28/2000 Document Revised: 08/27/2011 Document Reviewed: 10/30/2010 [employer] Patient Information 2015 [employer]. This information is not intended to replace advice given to you by your health care provider. Make sure you discuss any questions you have with your health care provider.  

## 2014-04-18 DEATH — deceased

## 2014-05-05 ENCOUNTER — Encounter: Payer: Self-pay | Admitting: Neurology

## 2014-05-11 ENCOUNTER — Encounter: Payer: Self-pay | Admitting: Neurology
